# Patient Record
Sex: Male | Born: 1950 | Race: Black or African American | Hispanic: No | Marital: Married | Smoking: Former smoker
Health system: Southern US, Community
[De-identification: ages and names within clinical notes are randomized; demographics above are authoritative.]

## PROBLEM LIST (undated history)

## (undated) DIAGNOSIS — J45909 Unspecified asthma, uncomplicated: Secondary | ICD-10-CM

## (undated) DIAGNOSIS — K5090 Crohn's disease, unspecified, without complications: Secondary | ICD-10-CM

## (undated) DIAGNOSIS — J449 Chronic obstructive pulmonary disease, unspecified: Secondary | ICD-10-CM

## (undated) DIAGNOSIS — K529 Noninfective gastroenteritis and colitis, unspecified: Secondary | ICD-10-CM

## (undated) HISTORY — PX: SHOULDER SURGERY: SHX246

## (undated) HISTORY — PX: BACK SURGERY: SHX140

## (undated) HISTORY — PX: COLON SURGERY: SHX602

---

## 1997-10-25 ENCOUNTER — Encounter: Admission: RE | Admit: 1997-10-25 | Discharge: 1997-10-25 | Payer: Self-pay | Admitting: Family Medicine

## 1997-12-31 ENCOUNTER — Encounter: Admission: RE | Admit: 1997-12-31 | Discharge: 1997-12-31 | Payer: Self-pay | Admitting: Family Medicine

## 1998-05-06 ENCOUNTER — Encounter: Admission: RE | Admit: 1998-05-06 | Discharge: 1998-05-06 | Payer: Self-pay | Admitting: Sports Medicine

## 2000-11-24 ENCOUNTER — Ambulatory Visit (HOSPITAL_COMMUNITY): Admission: RE | Admit: 2000-11-24 | Discharge: 2000-11-24 | Payer: Self-pay | Admitting: Gastroenterology

## 2003-07-04 ENCOUNTER — Inpatient Hospital Stay (HOSPITAL_COMMUNITY): Admission: AD | Admit: 2003-07-04 | Discharge: 2003-07-19 | Payer: Self-pay | Admitting: Emergency Medicine

## 2003-07-25 ENCOUNTER — Ambulatory Visit (HOSPITAL_COMMUNITY): Admission: RE | Admit: 2003-07-25 | Discharge: 2003-07-25 | Payer: Self-pay | Admitting: General Surgery

## 2003-08-07 ENCOUNTER — Ambulatory Visit (HOSPITAL_COMMUNITY): Admission: RE | Admit: 2003-08-07 | Discharge: 2003-08-07 | Payer: Self-pay | Admitting: General Surgery

## 2003-09-10 ENCOUNTER — Encounter: Admission: RE | Admit: 2003-09-10 | Discharge: 2003-09-10 | Payer: Self-pay | Admitting: General Surgery

## 2003-09-28 ENCOUNTER — Encounter (INDEPENDENT_AMBULATORY_CARE_PROVIDER_SITE_OTHER): Payer: Self-pay | Admitting: Specialist

## 2003-09-28 ENCOUNTER — Inpatient Hospital Stay (HOSPITAL_COMMUNITY): Admission: RE | Admit: 2003-09-28 | Discharge: 2003-10-05 | Payer: Self-pay | Admitting: General Surgery

## 2003-10-16 ENCOUNTER — Encounter: Admission: RE | Admit: 2003-10-16 | Discharge: 2003-10-16 | Payer: Self-pay | Admitting: General Surgery

## 2004-05-13 ENCOUNTER — Ambulatory Visit: Payer: Self-pay | Admitting: Internal Medicine

## 2005-02-05 ENCOUNTER — Ambulatory Visit: Payer: Self-pay | Admitting: Internal Medicine

## 2005-02-12 ENCOUNTER — Ambulatory Visit: Payer: Self-pay | Admitting: Internal Medicine

## 2005-03-17 ENCOUNTER — Ambulatory Visit: Payer: Self-pay | Admitting: Internal Medicine

## 2005-05-07 ENCOUNTER — Ambulatory Visit: Payer: Self-pay | Admitting: Internal Medicine

## 2005-05-14 ENCOUNTER — Ambulatory Visit: Payer: Self-pay | Admitting: Internal Medicine

## 2005-06-09 ENCOUNTER — Ambulatory Visit: Payer: Self-pay | Admitting: Internal Medicine

## 2005-11-10 ENCOUNTER — Ambulatory Visit: Payer: Self-pay | Admitting: Internal Medicine

## 2005-12-08 IMAGING — CT CT PELVIS W/ CM
1 of 2 series · 15 of 32 positions shown, 19 images · IV contrast (omnipaque)
Comparison: none

CLINICAL DATA: Abdominal pain. White blood cell count [DATE].  Right lower quadrant pain for two days with fever.  
 CT SCAN OF THE ABDOMEN WITH CONTRAST
 After the intravenous injection of 100 cc of Omnipaque 300, a series of scans of the entire abdomen are made and show the lower lung fields, liver, gallbladder, hepatic radicals, common bile duct, pancreas and spleen to be normal.  The adrenal glands and kidneys appear normal.  No abnormal lymph nodes are seen within the abdomen. 
 Oral contrast has passed through the stomach and small bowel into the right colon. 
 IMPRESSION
 Normal CT scan of the abdomen with contrast.   
 CT SCAN OF THE PELVIS WITH CONTRAST
 Utilizing the same contrast as given for the abdomen, a series of scans of the pelvis were made and show thickening and inflammation associated with the sigmoid and lower left colon.  There is a minimal amount of free air anterior to the mid sigmoid best seen on images #69 and 118.   This free air appears to be localized and measures less than 1 cc in total size.  The oral contrast has passed into the right colon.  The appendix is well seen and appears normal and has some reflux of the contrast material.  Bones of the pelvis appear normal.  There are numerous calcifications within the prostate but the prostate is not enlarged.  Distal ureters, bladder, seminal vesicles appear normal.   No abnormal lymph nodes are seen within the pelvis. 
 Diverticulitis with minimal extraluminal gas which appears to be contained within the fat as noted above.  Numerous prostatic calcifications.

[Series 2: routine abdomen · axial · 0.74mm/px · z∈[-489,-94]mm · 15 of 117 slices shown, 19 images]
[im 9/117  soft-tissue]
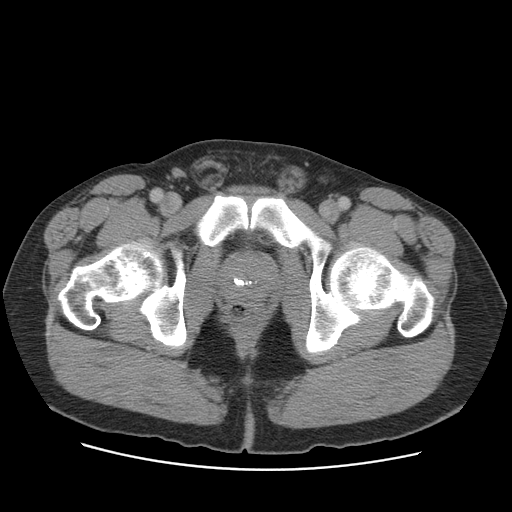
[im 9/117  bone]
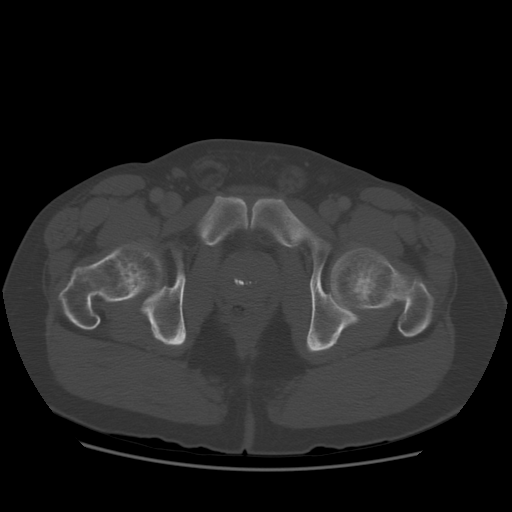
[im 17/117  soft-tissue]
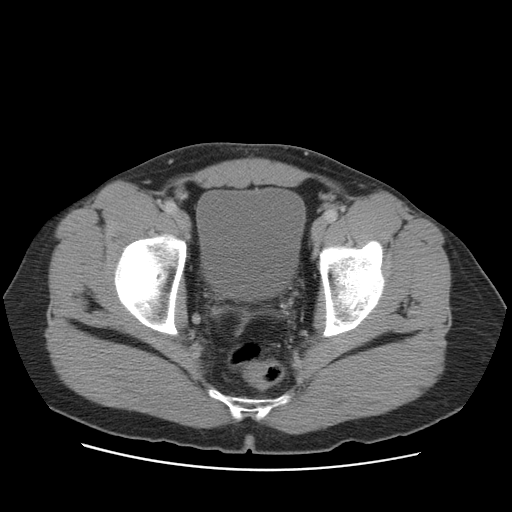
[im 25/117  soft-tissue]
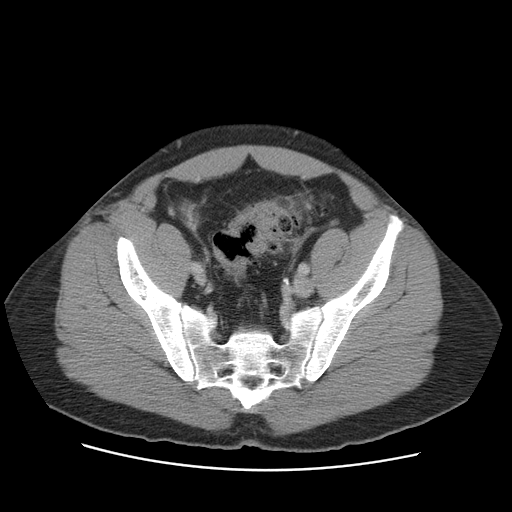
[im 33/117  soft-tissue]
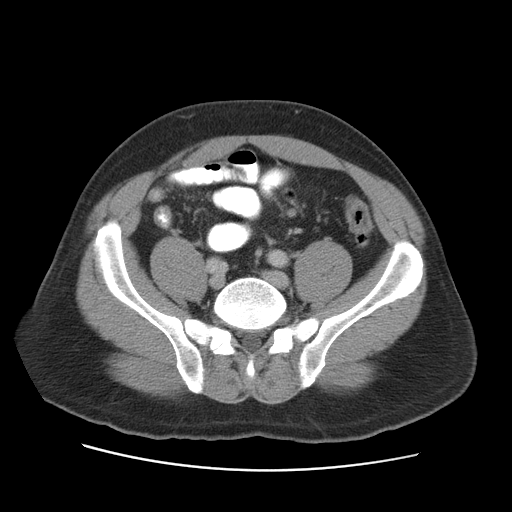
[im 41/117  soft-tissue]
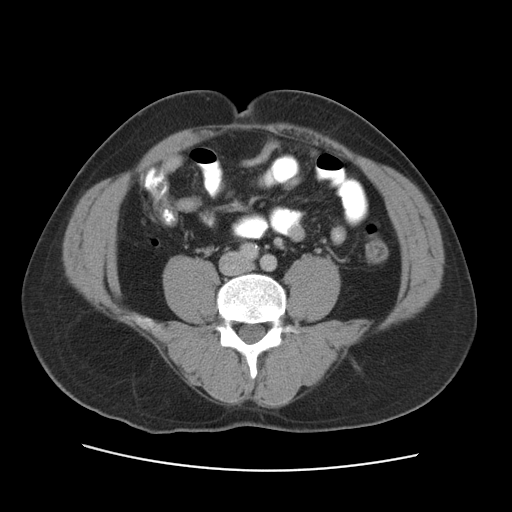
[im 49/117  soft-tissue]
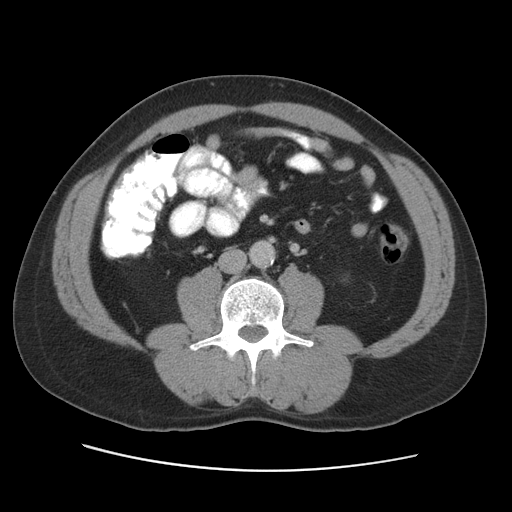
[im 61/117  soft-tissue]
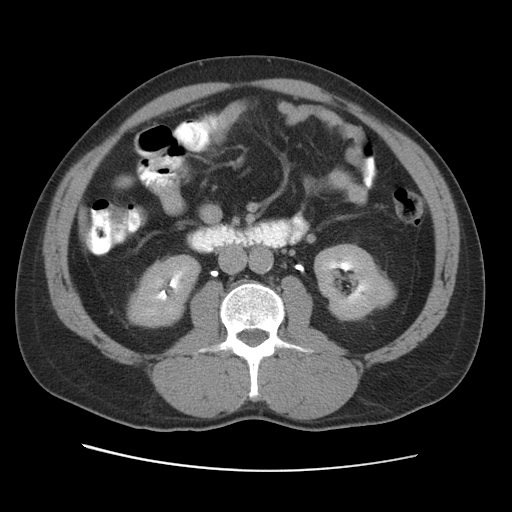
[im 69/117  soft-tissue]
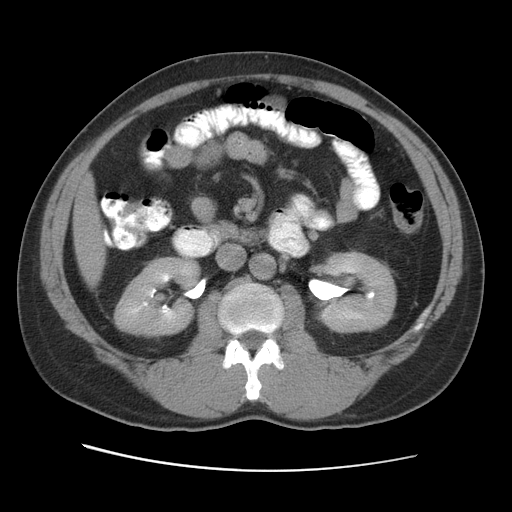
[im 77/117  soft-tissue]
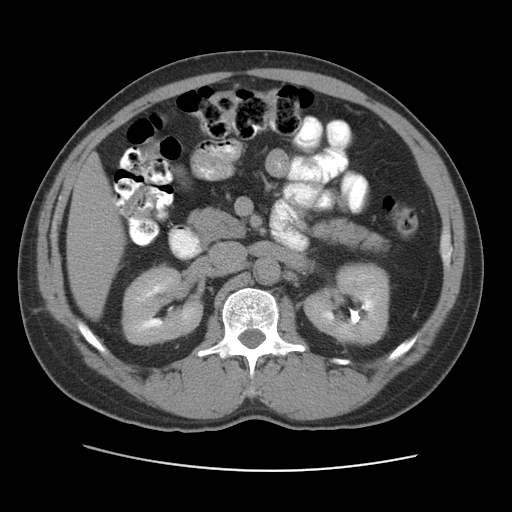
[im 77/117  bone]
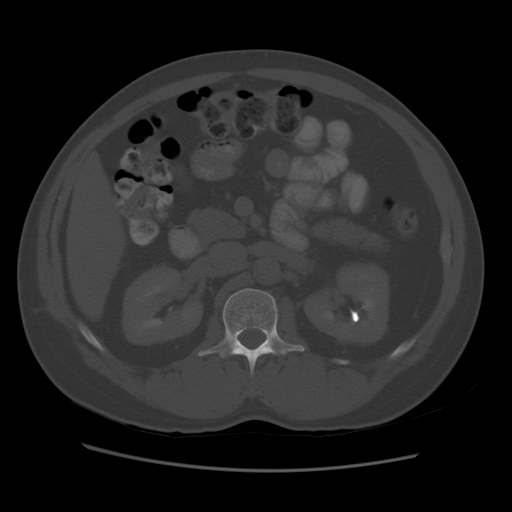
[im 85/117  soft-tissue]
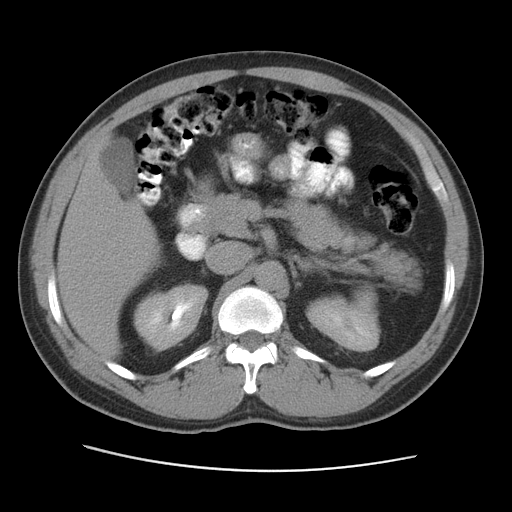
[im 93/117  soft-tissue]
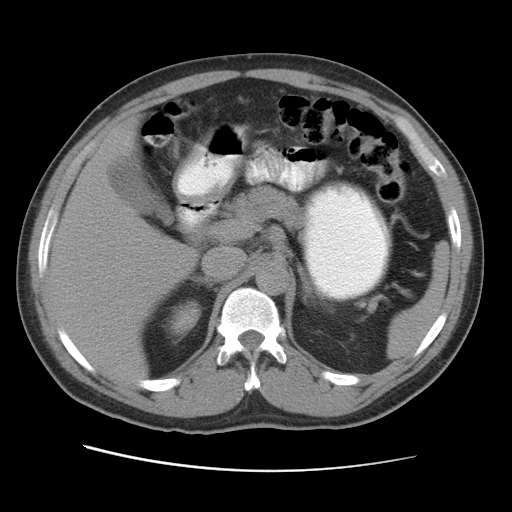
[im 101/117  soft-tissue]
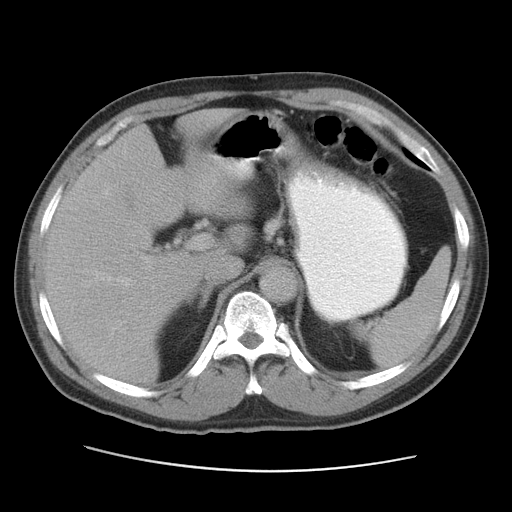
[im 101/117  lung]
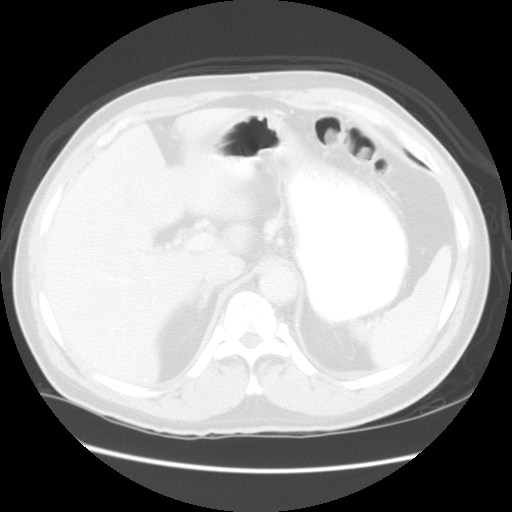
[im 105/117  lung]
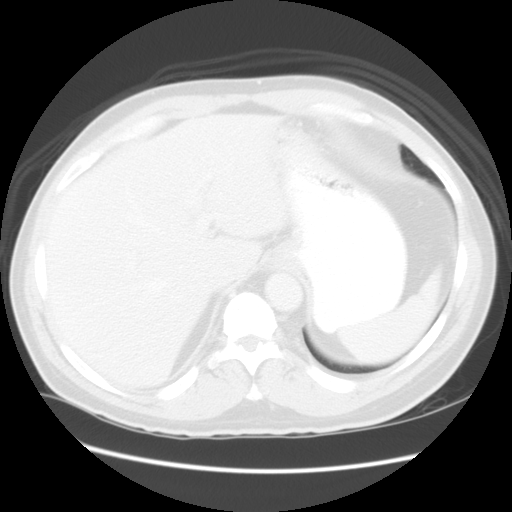
[im 109/117  soft-tissue]
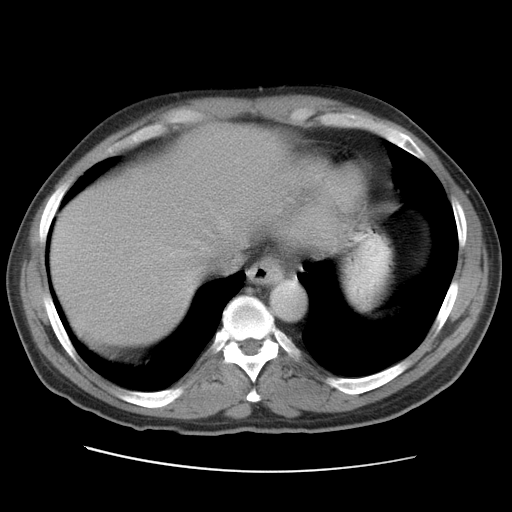
[im 109/117  lung]
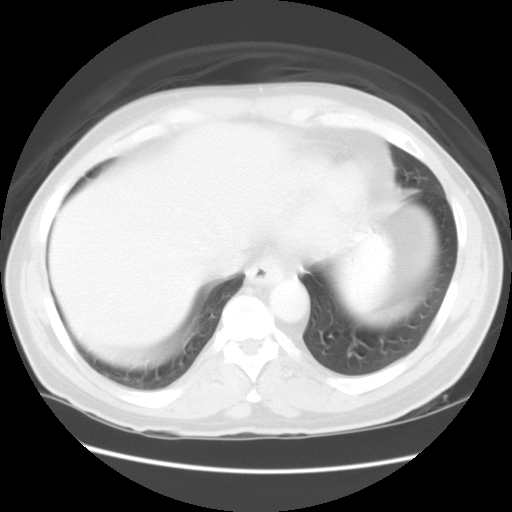
[im 113/117  lung]
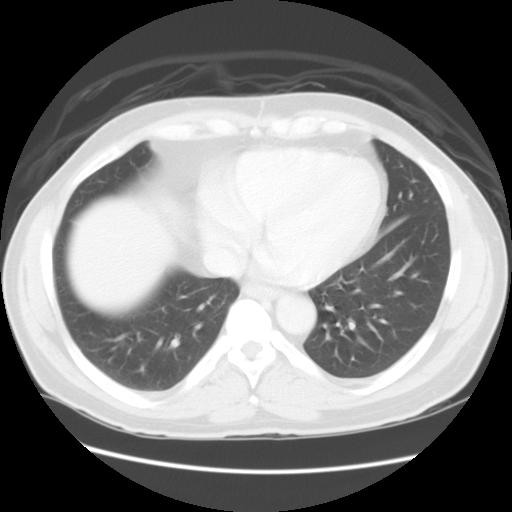

[15 of 32 positions shown; findings below may reference images not displayed]

## 2006-01-22 ENCOUNTER — Ambulatory Visit: Payer: Self-pay | Admitting: Internal Medicine

## 2006-06-10 ENCOUNTER — Ambulatory Visit: Payer: Self-pay | Admitting: Internal Medicine

## 2007-01-10 ENCOUNTER — Encounter: Payer: Self-pay | Admitting: Internal Medicine

## 2007-02-08 ENCOUNTER — Encounter: Payer: Self-pay | Admitting: Internal Medicine

## 2007-02-11 DIAGNOSIS — R519 Headache, unspecified: Secondary | ICD-10-CM | POA: Insufficient documentation

## 2007-02-11 DIAGNOSIS — R51 Headache: Secondary | ICD-10-CM

## 2007-03-16 ENCOUNTER — Encounter: Payer: Self-pay | Admitting: Internal Medicine

## 2007-03-25 ENCOUNTER — Encounter: Payer: Self-pay | Admitting: Internal Medicine

## 2007-04-06 ENCOUNTER — Encounter: Payer: Self-pay | Admitting: Internal Medicine

## 2007-04-12 ENCOUNTER — Encounter: Payer: Self-pay | Admitting: Internal Medicine

## 2007-04-19 ENCOUNTER — Encounter: Payer: Self-pay | Admitting: Internal Medicine

## 2007-04-22 ENCOUNTER — Ambulatory Visit: Payer: Self-pay | Admitting: Internal Medicine

## 2007-04-22 LAB — CONVERTED CEMR LAB
ALT: 11 units/L (ref 0–53)
AST: 18 units/L (ref 0–37)
Basophils Relative: 0 % (ref 0.0–1.0)
Bilirubin, Direct: 0.1 mg/dL (ref 0.0–0.3)
CO2: 31 meq/L (ref 19–32)
Calcium: 9.4 mg/dL (ref 8.4–10.5)
Chloride: 105 meq/L (ref 96–112)
Eosinophils Absolute: 0.5 10*3/uL (ref 0.0–0.6)
Eosinophils Relative: 5.2 % — ABNORMAL HIGH (ref 0.0–5.0)
GFR calc non Af Amer: 93 mL/min
Glucose, Bld: 93 mg/dL (ref 70–99)
HCT: 34.4 % — ABNORMAL LOW (ref 39.0–52.0)
Lymphocytes Relative: 30.7 % (ref 12.0–46.0)
MCV: 89.7 fL (ref 78.0–100.0)
Neutro Abs: 5 10*3/uL (ref 1.4–7.7)
Neutrophils Relative %: 52 % (ref 43.0–77.0)
Nitrite: NEGATIVE
PSA: 1.32 ng/mL (ref 0.10–4.00)
Platelets: 354 10*3/uL (ref 150–400)
RBC: 3.84 M/uL — ABNORMAL LOW (ref 4.22–5.81)
Sodium: 142 meq/L (ref 135–145)
Specific Gravity, Urine: >=1.03
Total Protein: 7 g/dL (ref 6.0–8.3)
Urobilinogen, UA: 0.2
VLDL: 23 mg/dL (ref 0–40)
WBC Urine, dipstick: NEGATIVE
WBC: 9.7 10*3/uL (ref 4.5–10.5)

## 2007-05-02 ENCOUNTER — Ambulatory Visit: Payer: Self-pay | Admitting: Internal Medicine

## 2007-05-02 DIAGNOSIS — J45901 Unspecified asthma with (acute) exacerbation: Secondary | ICD-10-CM

## 2007-05-02 DIAGNOSIS — K5090 Crohn's disease, unspecified, without complications: Secondary | ICD-10-CM | POA: Insufficient documentation

## 2007-05-02 DIAGNOSIS — Z86010 Personal history of colonic polyps: Secondary | ICD-10-CM | POA: Insufficient documentation

## 2007-05-13 ENCOUNTER — Encounter (HOSPITAL_COMMUNITY): Admission: RE | Admit: 2007-05-13 | Discharge: 2007-06-01 | Payer: Self-pay | Admitting: Gastroenterology

## 2007-05-13 ENCOUNTER — Encounter: Payer: Self-pay | Admitting: Internal Medicine

## 2007-05-25 ENCOUNTER — Encounter: Payer: Self-pay | Admitting: Internal Medicine

## 2007-06-22 ENCOUNTER — Encounter: Payer: Self-pay | Admitting: Internal Medicine

## 2007-06-23 ENCOUNTER — Encounter (HOSPITAL_COMMUNITY): Admission: RE | Admit: 2007-06-23 | Discharge: 2007-09-21 | Payer: Self-pay | Admitting: Gastroenterology

## 2007-07-19 ENCOUNTER — Encounter: Payer: Self-pay | Admitting: Internal Medicine

## 2007-07-28 ENCOUNTER — Ambulatory Visit: Payer: Self-pay | Admitting: Internal Medicine

## 2007-07-28 DIAGNOSIS — K645 Perianal venous thrombosis: Secondary | ICD-10-CM

## 2007-08-25 ENCOUNTER — Ambulatory Visit: Payer: Self-pay | Admitting: Internal Medicine

## 2007-08-25 DIAGNOSIS — J0100 Acute maxillary sinusitis, unspecified: Secondary | ICD-10-CM

## 2007-09-23 ENCOUNTER — Encounter: Payer: Self-pay | Admitting: Internal Medicine

## 2007-09-27 ENCOUNTER — Encounter (HOSPITAL_COMMUNITY): Admission: RE | Admit: 2007-09-27 | Discharge: 2007-12-26 | Payer: Self-pay | Admitting: Gastroenterology

## 2007-10-17 ENCOUNTER — Emergency Department (HOSPITAL_COMMUNITY): Admission: EM | Admit: 2007-10-17 | Discharge: 2007-10-17 | Payer: Self-pay | Admitting: Emergency Medicine

## 2007-10-21 ENCOUNTER — Encounter: Payer: Self-pay | Admitting: Internal Medicine

## 2007-12-14 ENCOUNTER — Encounter: Payer: Self-pay | Admitting: Internal Medicine

## 2008-01-17 ENCOUNTER — Encounter (HOSPITAL_COMMUNITY): Admission: RE | Admit: 2008-01-17 | Discharge: 2008-01-18 | Payer: Self-pay | Admitting: Gastroenterology

## 2008-01-23 ENCOUNTER — Encounter: Payer: Self-pay | Admitting: Internal Medicine

## 2008-02-22 ENCOUNTER — Encounter (HOSPITAL_COMMUNITY): Admission: RE | Admit: 2008-02-22 | Discharge: 2008-05-22 | Payer: Self-pay | Admitting: Gastroenterology

## 2008-05-03 ENCOUNTER — Encounter: Payer: Self-pay | Admitting: Internal Medicine

## 2008-05-28 ENCOUNTER — Encounter: Payer: Self-pay | Admitting: Internal Medicine

## 2008-06-13 ENCOUNTER — Encounter (HOSPITAL_COMMUNITY): Admission: RE | Admit: 2008-06-13 | Discharge: 2008-09-11 | Payer: Self-pay | Admitting: Gastroenterology

## 2008-09-12 ENCOUNTER — Encounter (HOSPITAL_COMMUNITY): Admission: RE | Admit: 2008-09-12 | Discharge: 2008-12-11 | Payer: Self-pay | Admitting: Gastroenterology

## 2008-11-07 ENCOUNTER — Encounter: Payer: Self-pay | Admitting: Internal Medicine

## 2009-01-08 ENCOUNTER — Encounter (HOSPITAL_COMMUNITY): Admission: RE | Admit: 2009-01-08 | Discharge: 2009-04-08 | Payer: Self-pay | Admitting: Gastroenterology

## 2009-04-08 ENCOUNTER — Observation Stay (HOSPITAL_COMMUNITY): Admission: EM | Admit: 2009-04-08 | Discharge: 2009-04-08 | Payer: Self-pay | Admitting: Emergency Medicine

## 2009-04-09 ENCOUNTER — Ambulatory Visit: Payer: Self-pay | Admitting: Internal Medicine

## 2009-04-09 DIAGNOSIS — E7800 Pure hypercholesterolemia, unspecified: Secondary | ICD-10-CM | POA: Insufficient documentation

## 2009-04-09 DIAGNOSIS — Z87891 Personal history of nicotine dependence: Secondary | ICD-10-CM | POA: Insufficient documentation

## 2009-04-10 ENCOUNTER — Encounter (HOSPITAL_COMMUNITY): Admission: RE | Admit: 2009-04-10 | Discharge: 2009-05-31 | Payer: Self-pay | Admitting: Gastroenterology

## 2009-04-18 ENCOUNTER — Telehealth: Payer: Self-pay | Admitting: Internal Medicine

## 2009-05-14 ENCOUNTER — Telehealth: Payer: Self-pay | Admitting: Internal Medicine

## 2009-11-04 ENCOUNTER — Ambulatory Visit: Payer: Self-pay | Admitting: Internal Medicine

## 2009-11-04 DIAGNOSIS — L255 Unspecified contact dermatitis due to plants, except food: Secondary | ICD-10-CM

## 2010-03-20 ENCOUNTER — Encounter (HOSPITAL_COMMUNITY)
Admission: RE | Admit: 2010-03-20 | Discharge: 2010-06-18 | Payer: Self-pay | Source: Home / Self Care | Attending: Internal Medicine | Admitting: Internal Medicine

## 2010-05-30 ENCOUNTER — Emergency Department (HOSPITAL_COMMUNITY)
Admission: EM | Admit: 2010-05-30 | Discharge: 2010-05-30 | Payer: Self-pay | Source: Home / Self Care | Admitting: Emergency Medicine

## 2010-06-19 ENCOUNTER — Encounter (HOSPITAL_COMMUNITY)
Admission: RE | Admit: 2010-06-19 | Discharge: 2010-07-01 | Payer: Self-pay | Source: Home / Self Care | Attending: Internal Medicine | Admitting: Internal Medicine

## 2010-06-21 ENCOUNTER — Encounter: Payer: Self-pay | Admitting: General Surgery

## 2010-06-22 ENCOUNTER — Encounter: Payer: Self-pay | Admitting: General Surgery

## 2010-07-01 NOTE — Assessment & Plan Note (Signed)
Summary: rash/njr   Vital Signs:  Patient profile:   60 year old male Weight:      216 pounds Temp:     98.1 degrees F oral BP sitting:   112 / 74  (right arm) Cuff size:   regular  Vitals Entered By: Duard Brady LPN (November 04, 2593 10:55 AM) CC: c/o rash to (L) arm and trunk x1wk - itching Is Patient Diabetic? No   CC:  c/o rash to (L) arm and trunk x1wk - itching.  History of Present Illness: 60 year old patient who is seen today for follow-up of his blood pressure.  He presented to J. complaint involved in a rash involving his arms and left trunk region.  This is quite pruritic.  He does have a history of Crohn's disease and is followed closely by GI.  He has treated dyslipidemia.  He has a history of, asthma that's been stable  Preventive Screening-Counseling & Management  Alcohol-Tobacco     Smoking Status: never  Allergies (verified): No Known Drug Allergies  Past History:  Past Medical History: Reviewed history from 05/02/2007 and no changes required. High cholesterol Headache IBD 1997 Asthma Colonic polyps, hx of  Social History: Smoking Status:  never  Review of Systems       The patient complains of dyspnea on exertion and suspicious skin lesions.  The patient denies anorexia, fever, weight loss, weight gain, vision loss, decreased hearing, hoarseness, chest pain, syncope, peripheral edema, prolonged cough, headaches, hemoptysis, abdominal pain, melena, hematochezia, severe indigestion/heartburn, hematuria, incontinence, genital sores, transient blindness, difficulty walking, depression, unusual weight change, abnormal bleeding, enlarged lymph nodes, angioedema, breast masses, and testicular masses.    Physical Exam  General:  overweight-appearing.  120/80overweight-appearing.   Head:  Normocephalic and atraumatic without obvious abnormalities. No apparent alopecia or balding. Eyes:  No corneal or conjunctival inflammation noted. EOMI. Perrla.  Funduscopic exam benign, without hemorrhages, exudates or papilledema. Vision grossly normal. Mouth:  Oral mucosa and oropharynx without lesions or exudates.  Teeth in good repair. Neck:  No deformities, masses, or tenderness noted. Lungs:  Normal respiratory effort, chest expands symmetrically. Lungs are clear to auscultation, no crackles or wheezes. Heart:  Normal rate and regular rhythm. S1 and S2 normal without gallop, murmur, click, rub or other extra sounds. Abdomen:  Bowel sounds positive,abdomen soft and non-tender without masses, organomegaly or hernias noted. Msk:  No deformity or scoliosis noted of thoracic or lumbar spine.     Impression & Recommendations:  Problem # 1:  HYPERCHOLESTEROLEMIA (ICD-272.0)  His updated medication list for this problem includes:    Simvastatin 80 Mg Tabs (Simvastatin) .Marland Kitchen... 1 once daily  His updated medication list for this problem includes:    Simvastatin 80 Mg Tabs (Simvastatin) .Marland Kitchen... 1 once daily  Problem # 2:  CROHN'S DISEASE (ICD-555.9)  Problem # 3:  ASTHMA (ICD-493.90)  His updated medication list for this problem includes:    Asmanex 30 Metered Doses 220 Mcg/inh Aepb (Mometasone furoate) .Marland Kitchen... 2 puffs at bedtime    Perforomist 20 Mcg/67ml Nebu (Formoterol fumarate) ..... Use qid    Albuterol 90 Mcg/act Aers (Albuterol) .Marland Kitchen... As dir  His updated medication list for this problem includes:    Asmanex 30 Metered Doses 220 Mcg/inh Aepb (Mometasone furoate) .Marland Kitchen... 2 puffs at bedtime    Perforomist 20 Mcg/48ml Nebu (Formoterol fumarate) ..... Use qid    Albuterol 90 Mcg/act Aers (Albuterol) .Marland Kitchen... As dir  Problem # 4:  RHUS DERMATITIS (ICD-692.6)  His updated medication  list for this problem includes:    Cvs Loratadine 10 Mg Tabs (Loratadine) .Marland Kitchen... 1 once daily    Triamcinolone Acetonide 0.1 % Crea (Triamcinolone acetonide) .Marland Kitchen... Apply twice daily  His updated medication list for this problem includes:    [facility]vs Loratadine 10 Mg Tabs  (Loratadine) .Marland Kitchen... 1 once daily    Triamcinolone Acetonide 0.1 % Crea (Triamcinolone acetonide) .Marland Kitchen... Apply twice daily  Complete Medication List: 1)  Simvastatin 80 Mg Tabs (Simvastatin) .Marland Kitchen.. 1 once daily 2)  Asmanex 30 Metered Doses 220 Mcg/inh Aepb (Mometasone furoate) .... 2 puffs at bedtime 3)  [facility]vs Loratadine 10 Mg Tabs (Loratadine) .Marland Kitchen.. 1 once daily 4)  Flunisolide 0.025 % Soln (Flunisolide) .... As dir 5)  Perforomist 20 Mcg/58ml Nebu (Formoterol fumarate) .... Use qid 6)  Albuterol 90 Mcg/act Aers (Albuterol) .... As dir 7)  Levitra 20 Mg Tabs (Vardenafil hcl) 8)  Remicade 100 Mg Solr (Infliximab) 9)  Vitamin D 1000 Unit Tabs (Cholecalciferol) .... Qd 10)  Triamcinolone Acetonide 0.1 % Crea (Triamcinolone acetonide) .... Apply twice daily  Patient Instructions: 1)  Please schedule a follow-up appointment in 6 months. 2)  Limit your Sodium (Salt) to less than 2 grams a day(slightly less than 1/2 a teaspoon) to prevent fluid retention, swelling, or worsening of symptoms. 3)  It is important that you exercise regularly at least 20 minutes 5 times a week. If you develop chest pain, have severe difficulty breathing, or feel very tired , stop exercising immediately and seek medical attention. 4)  You need to lose weight. Consider a lower calorie diet and regular exercise.  Prescriptions: TRIAMCINOLONE ACETONIDE 0.1 % CREA (TRIAMCINOLONE ACETONIDE) apply twice daily  #60 gm x 0   Entered and Authorized by:   Gordy Savers  MD   Signed by:   Gordy Savers  MD on 11/04/2009   Method used:   Print then Give to Patient   RxID:   [id number] TRIAMCINOLONE ACETONIDE 0.1 % CREA (TRIAMCINOLONE ACETONIDE) apply twice daily  #60 gm x 0   Entered and Authorized by:   Gordy Savers  MD   Signed by:   Gordy Savers  MD on 11/04/2009   Method used:   Electronically to        [facility] [address] [phone]* (retail)       [address]       [place], [place]  [address]       Ph: [phone] or [phone]       Fax: [phone]   RxID:   [id number] LEVITRA 20 MG  TABS (VARDENAFIL HCL)   #12 x 6   Entered and Authorized by:   Gordy Savers  MD   Signed by:   Gordy Savers  MD on 11/04/2009   Method used:   Print then Give to Patient   RxID:   [id number] ALBUTEROL 90 MCG/ACT  AERS (ALBUTEROL) as dir  #3 x 6   Entered and Authorized by:   Gordy Savers  MD   Signed by:   Gordy Savers  MD on 11/04/2009   Method used:   [facility]        [facility] [address] [phone]* (retail)       [address]       [place], [place]  [address]       Ph: [phone] or  [phone]       Fax: [phone]   RxID:   [id number] PERFOROMIST 20 MCG/2ML  NEBU (FORMOTEROL FUMARATE) use qid  #120 x 6   Entered and Authorized by:   Gordy Savers  MD   Signed by:   Gordy Savers  MD on 11/04/2009   Method used:   [facility]ronically to        [facility] [address] [phone]* (retail)       [address]       [place], [place]  [address]       Ph: [phone] or [phone]       Fax: [phone]   RxID:   [id number] FLUNISOLIDE 0.025 %  SOLN (FLUNISOLIDE) as dir  #3 x 6   Entered and Authorized by:   Gordy Savers  MD   Signed by:   Gordy Savers  MD on 11/04/2009   Method used:   [facility]ronically to        [facility] [address] [phone]* (retail)       [address]       [place], [place]  [address]       Ph: [phone] or [phone]       Fax: [phone]   RxID:   [id number] ASMANEX 30 METERED DOSES 220 MCG/INH  AEPB (MOMETASONE FUROATE) 2 puffs at bedtime  #3 x 6   Entered and Authorized by:   Gordy Savers  MD   Signed by:   Gordy Savers  MD on 11/04/2009   Method used:   [facility]ronically to        [facility] [address] [phone]* (retail)       [address]       [place], [place]  [address]       Ph: [phone] or [phone]       Fax: [phone]   RxID:   [id number] SIMVASTATIN 80 MG  TABS (SIMVASTATIN) 1 once daily  #90 x 6   Entered and Authorized by:   Gordy Savers  MD   Signed by:   Gordy Savers  MD on 11/04/2009   Method used:   [facility]ron[facility] to        [facility] [address] [phone]* (retail)       [address]       [place], [place]  [address]       Ph: [phone] or [phone]       Fax: [phone]   RxID:   [id number]

## 2010-07-03 ENCOUNTER — Ambulatory Visit (HOSPITAL_COMMUNITY): Payer: Self-pay

## 2010-07-08 ENCOUNTER — Ambulatory Visit (HOSPITAL_COMMUNITY): Payer: Self-pay

## 2010-07-10 ENCOUNTER — Ambulatory Visit (HOSPITAL_COMMUNITY): Payer: Self-pay

## 2010-07-15 ENCOUNTER — Ambulatory Visit (HOSPITAL_COMMUNITY): Payer: Self-pay

## 2010-07-17 ENCOUNTER — Ambulatory Visit (HOSPITAL_COMMUNITY): Payer: Self-pay

## 2010-07-22 ENCOUNTER — Ambulatory Visit (HOSPITAL_COMMUNITY): Payer: Self-pay

## 2010-07-24 ENCOUNTER — Ambulatory Visit (HOSPITAL_COMMUNITY): Payer: Self-pay

## 2010-07-29 ENCOUNTER — Ambulatory Visit (HOSPITAL_COMMUNITY): Payer: Self-pay

## 2010-07-31 ENCOUNTER — Ambulatory Visit (HOSPITAL_COMMUNITY): Payer: Self-pay

## 2010-08-05 ENCOUNTER — Ambulatory Visit (HOSPITAL_COMMUNITY): Payer: Self-pay

## 2010-08-07 ENCOUNTER — Ambulatory Visit (HOSPITAL_COMMUNITY): Payer: Self-pay

## 2010-08-12 ENCOUNTER — Ambulatory Visit (HOSPITAL_COMMUNITY): Payer: Self-pay

## 2010-08-14 ENCOUNTER — Ambulatory Visit (HOSPITAL_COMMUNITY): Payer: Self-pay

## 2010-08-19 ENCOUNTER — Ambulatory Visit (HOSPITAL_COMMUNITY): Payer: Self-pay

## 2010-08-21 ENCOUNTER — Ambulatory Visit (HOSPITAL_COMMUNITY): Payer: Self-pay

## 2010-08-26 ENCOUNTER — Ambulatory Visit (HOSPITAL_COMMUNITY): Payer: Self-pay

## 2010-08-28 ENCOUNTER — Ambulatory Visit (HOSPITAL_COMMUNITY): Payer: Self-pay

## 2010-09-02 ENCOUNTER — Ambulatory Visit (HOSPITAL_COMMUNITY): Payer: Self-pay

## 2010-09-03 LAB — POCT I-STAT, CHEM 8
BUN: 9 mg/dL (ref 6–23)
Calcium, Ion: 1.19 mmol/L (ref 1.12–1.32)
Creatinine, Ser: 1 mg/dL (ref 0.4–1.5)
Glucose, Bld: 110 mg/dL — ABNORMAL HIGH (ref 70–99)
Hemoglobin: 13.9 g/dL (ref 13.0–17.0)
TCO2: 30 mmol/L (ref 0–100)

## 2010-09-03 LAB — HEPATIC FUNCTION PANEL
ALT: 12 U/L (ref 0–53)
AST: 19 U/L (ref 0–37)
Albumin: 3.4 g/dL — ABNORMAL LOW (ref 3.5–5.2)
Alkaline Phosphatase: 64 U/L (ref 39–117)
Total Bilirubin: 0.6 mg/dL (ref 0.3–1.2)
Total Protein: 7.2 g/dL (ref 6.0–8.3)

## 2010-09-03 LAB — BASIC METABOLIC PANEL
Chloride: 107 mEq/L (ref 96–112)
GFR calc non Af Amer: >60 mL/min (ref >60)
Glucose, Bld: 111 mg/dL — ABNORMAL HIGH (ref 70–99)
Potassium: 4.1 mEq/L (ref 3.5–5.1)
Sodium: 142 mEq/L (ref 135–145)

## 2010-09-03 LAB — LIPID PANEL
Cholesterol: 270 mg/dL — ABNORMAL HIGH (ref 0–200)
Total CHOL/HDL Ratio: 6.8 RATIO

## 2010-09-03 LAB — TROPONIN I
Troponin I: 0.01 ng/mL (ref 0.00–0.06)
Troponin I: 0.01 ng/mL (ref 0.00–0.06)

## 2010-09-03 LAB — HOMOCYSTEINE: Homocysteine: 5.5 umol/L (ref 4.0–15.4)

## 2010-09-03 LAB — CBC
HCT: 37.6 % — ABNORMAL LOW (ref 39.0–52.0)
Hemoglobin: 12.8 g/dL — ABNORMAL LOW (ref 13.0–17.0)
MCV: 92.8 fL (ref 78.0–100.0)
RDW: 12.7 % (ref 11.5–15.5)
WBC: 9.4 10*3/uL (ref 4.0–10.5)

## 2010-09-03 LAB — CK TOTAL AND CKMB (NOT AT ARMC)
Relative Index: 1 (ref 0.0–2.5)
Relative Index: 1.1 (ref 0.0–2.5)

## 2010-09-03 LAB — LIPASE, BLOOD: Lipase: 34 U/L (ref 11–59)

## 2010-09-03 LAB — TSH: TSH: 1.325 u[IU]/mL (ref 0.350–4.500)

## 2010-09-04 ENCOUNTER — Ambulatory Visit (HOSPITAL_COMMUNITY): Payer: Self-pay

## 2010-09-09 ENCOUNTER — Ambulatory Visit (HOSPITAL_COMMUNITY): Payer: Self-pay

## 2010-09-11 ENCOUNTER — Ambulatory Visit (HOSPITAL_COMMUNITY): Payer: Self-pay

## 2010-09-16 ENCOUNTER — Ambulatory Visit (HOSPITAL_COMMUNITY): Payer: Self-pay

## 2010-09-18 ENCOUNTER — Ambulatory Visit (HOSPITAL_COMMUNITY): Payer: Self-pay

## 2010-09-23 ENCOUNTER — Ambulatory Visit (HOSPITAL_COMMUNITY): Payer: Self-pay

## 2010-09-25 ENCOUNTER — Ambulatory Visit (HOSPITAL_COMMUNITY): Payer: Self-pay

## 2010-09-30 ENCOUNTER — Ambulatory Visit (HOSPITAL_COMMUNITY): Payer: Self-pay

## 2010-10-02 ENCOUNTER — Ambulatory Visit (HOSPITAL_COMMUNITY): Payer: Self-pay

## 2010-10-07 ENCOUNTER — Ambulatory Visit (HOSPITAL_COMMUNITY): Payer: Self-pay

## 2010-10-09 ENCOUNTER — Ambulatory Visit (HOSPITAL_COMMUNITY): Payer: Self-pay

## 2010-10-14 ENCOUNTER — Ambulatory Visit (HOSPITAL_COMMUNITY): Payer: Self-pay

## 2010-10-17 NOTE — Op Note (Signed)
NAME:  Kevin Yang, Kevin Yang                          ACCOUNT NO.:  [id number]   MEDICAL RECORD NO.:  [id number]                   PATIENT TYPE:  INP   LOCATION:  5713                                 FACILITY:  [facility]   PHYSICIAN:  Ollen Gross. Vernell Morgans, M.D.              DATE OF BIRTH:  1950-10-25   DATE OF PROCEDURE:  09/28/2003  DATE OF DISCHARGE:                                 OPERATIVE REPORT   PREOPERATIVE DIAGNOSIS:  Prior sigmoid diverticulitis.   POSTOPERATIVE DIAGNOSIS:  Prior sigmoid diverticulitis.   PROCEDURE:  Sigmoid colectomy.   SURGEON:  Ollen Gross. Carolynne Edouard, M.D.   ASSISTANT:  Rose Phi. Maple Hudson, M.D.   ANESTHESIA:  General endotracheal.   PROCEDURE:  After informed consent was obtained, the patient was brought to  the operating room and placed in the supine position on the operating table.  After adequate induction of general anesthesia, the patient was placed in  lithotomy position.  His abdomen was prepped with Betadine and draped in the  usual sterile manner.  A lower midline incision was made with a 10 blade  knife.  This incision was carried down through the skin and subcutaneous  tissue sharply with the electrocautery until the linea alba was identified.  The linea alba was also incised with the electrocautery.  The preperitoneal  space was then probed bluntly with a hemostat until the peritoneum was  opened and access was gained to the abdominal cavity.  The rest of the  incision was then opened under direct vision with the electrocautery.  A  Balfour retractor was placed to retract the abdominal sidewall laterally,  and a bladder blade was used to retract inferiorly.  There was some dense  inflammation and adhesion of the sigmoid colon to the anterior abdominal  wall-bladder area.  This was taken down with blunt finger dissection.  The  sigmoid colon was then mobilized by incising its retroperitoneal attachment  along the white line of Toldt laterally.  This allowed the  sigmoid colon to  be very mobile.  There was a short segment of sigmoid colon that appeared to  be diseased.  The rest of the left colon appeared to be normal.  A site was  chosen initially above the area of disease where the left colon appeared to  be normal.  The mesentery at this point was opened sharply with the  electrocautery.  The left colon was also mobilized by incising its  retroperitoneal attachment along the white line of Toldt so it could be  mobilized inferiorly.  The colon at this point was then divided between a  [employer] clamp.  The mesentery of the sigmoid colon was then taken  down by serially clamping with Kelly clamps, dividing and ligating with 2-0  silk ties each of the vessels in the mesentery.  The ureter was identified  and care was taken to keep the dissection away  from where the ureter runs.  Once the dissection had been carried below the area of diseased sigmoid  colon, the mesentery at the rectosigmoid where the bowel appeared to be  healthy was opened sharply with the electrocautery.  A right angle bowel  clamp was placed across the bowel at this point and the bowel was divided  sharply beneath this clamp with a 10 blade knife and the rest of the  mesentery was taken down to this segment with a Kelly clamp and ligating  with 2-0 silk ties.  The sigmoid colon specimen was then removed from the  patient and sent to pathology for further evaluation.  The proximal and  distal segments reached each other easily without any tension.  The  anastomosis was created using full-thickness interrupted 3-0 silk stitches.  The back wall was done first.  All the stitches were placed and then each  one was tied down.  Once the back was in good approximation, the anterior  wall was then anastomosed in the same way using full-thickness interrupted 3-  0 silk stitches, burying the knots inside.  The last several stitches were  placed simply with the knots on the outside.   Once this was accomplished,  the bowel anastomosis appeared to be very healthy and intact.  The wound was  all examined and found to b hemostatic.  The abdomen was then irrigated with  copious amounts of saline.  The rest of the abdomen was inspected and there  were no other abnormalities noted.  The anterior abdominal wall fascia was  closed with two running #1 PDS sutures.  The subcutaneous tissue was  irrigated with Betadine and saline solution and the skin was closed with  staples.  Sterile dressings were applied.  At the end of the case all  needle, sponge, and instrument counts were correct.  The patient was then  awakened and taken to the recovery room in stable condition.                                               Ollen Gross. Vernell Morgans, M.D.    PST/MEDQ  D:  10/04/2003  T:  10/04/2003  Job:  [id number]

## 2010-10-17 NOTE — Discharge Summary (Signed)
NAME:  Kevin Yang, Kevin Yang                          ACCOUNT NO.:  [id number]   MEDICAL RECORD NO.:  [id number]                   PATIENT TYPE:  INP   LOCATION:  5713                                 FACILITY:  [facility]   PHYSICIAN:  Ollen Gross. Vernell Morgans, M.D.              DATE OF BIRTH:  Sep 09, 1950   DATE OF ADMISSION:  09/28/2003  DATE OF DISCHARGE:  10/05/2003                                 DISCHARGE SUMMARY   HOSPITAL COURSE:  Mr. Kepner is a 60 year old gentleman who was brought to  the operating room on September 20, 2003 for a sigmoid colectomy from a  complicated sigmoid diverticulitis.  He tolerated the operation well.  His  postoperative course was complicated by a short ileus requiring bowel rest  and NG tube, the NG tube was able to be discontinued on Oct 02, 2003 and his  diet was slowly advanced, he did also develop a small superficial wound  infection the upper portion of his incision which was open prior to  discharge and he was instructed on dressing changes, home health nursing was  arranged for dressing changes at home but he tolerated all this and on Oct 05, 2003 was ready for discharge.   MEDICATIONS AT THE TIME OF DISCHARGE:  He was to resume his home meds and he  was given a prescription for Vicodin for pain.   ACTIVITY:  No heavy lifting.   DIET:  As tolerated.   FINAL DIAGNOSIS:  Sigmoid diverticulitis.   FOLLOW UP:  Dr. Carolynne Edouard in 2 weeks.   CONDITION:  Stable.   DISPOSITION:  He is discharged home.                                                Ollen Gross. Vernell Morgans, M.D.    PST/MEDQ  D:  11/21/2003  T:  11/23/2003  Job:  [id number]

## 2010-12-21 ENCOUNTER — Emergency Department (HOSPITAL_COMMUNITY)
Admission: EM | Admit: 2010-12-21 | Discharge: 2010-12-21 | Disposition: A | Discharge status: Home / Self Care | Payer: Medicare Other | Attending: Emergency Medicine | Admitting: Emergency Medicine

## 2010-12-21 ENCOUNTER — Emergency Department (HOSPITAL_COMMUNITY): Payer: Medicare Other

## 2010-12-21 DIAGNOSIS — K5090 Crohn's disease, unspecified, without complications: Secondary | ICD-10-CM | POA: Insufficient documentation

## 2010-12-21 DIAGNOSIS — I10 Essential (primary) hypertension: Secondary | ICD-10-CM | POA: Insufficient documentation

## 2010-12-21 DIAGNOSIS — R42 Dizziness and giddiness: Secondary | ICD-10-CM | POA: Insufficient documentation

## 2010-12-21 DIAGNOSIS — E7800 Pure hypercholesterolemia, unspecified: Secondary | ICD-10-CM | POA: Insufficient documentation

## 2010-12-21 DIAGNOSIS — R55 Syncope and collapse: Secondary | ICD-10-CM | POA: Insufficient documentation

## 2010-12-21 DIAGNOSIS — Z79899 Other long term (current) drug therapy: Secondary | ICD-10-CM | POA: Insufficient documentation

## 2010-12-21 DIAGNOSIS — J4489 Other specified chronic obstructive pulmonary disease: Secondary | ICD-10-CM | POA: Insufficient documentation

## 2010-12-21 DIAGNOSIS — R002 Palpitations: Secondary | ICD-10-CM | POA: Insufficient documentation

## 2010-12-21 DIAGNOSIS — J449 Chronic obstructive pulmonary disease, unspecified: Secondary | ICD-10-CM | POA: Insufficient documentation

## 2010-12-21 LAB — TROPONIN I: Troponin I: <0.3 ng/mL (ref <0.30)

## 2010-12-21 LAB — CBC
HCT: 41.8 % (ref 39.0–52.0)
Hemoglobin: 14 g/dL (ref 13.0–17.0)
WBC: 9.5 10*3/uL (ref 4.0–10.5)

## 2010-12-21 LAB — DIFFERENTIAL
Basophils Absolute: 0 10*3/uL (ref 0.0–0.1)
Lymphocytes Relative: 32 % (ref 12–46)
Lymphs Abs: 3.1 10*3/uL (ref 0.7–4.0)
Neutro Abs: 5.4 10*3/uL (ref 1.7–7.7)

## 2010-12-21 LAB — COMPREHENSIVE METABOLIC PANEL
ALT: 13 U/L (ref 0–53)
AST: 16 U/L (ref 0–37)
Albumin: 3.5 g/dL (ref 3.5–5.2)
Alkaline Phosphatase: 70 U/L (ref 39–117)
Potassium: 3.8 mEq/L (ref 3.5–5.1)
Sodium: 141 mEq/L (ref 135–145)
Total Protein: 8 g/dL (ref 6.0–8.3)

## 2010-12-21 LAB — CK TOTAL AND CKMB (NOT AT ARMC)
CK, MB: 1.6 ng/mL (ref 0.3–4.0)
Relative Index: INVALID (ref 0.0–2.5)

## 2011-08-24 ENCOUNTER — Telehealth: Payer: Self-pay | Admitting: Internal Medicine

## 2011-08-24 ENCOUNTER — Ambulatory Visit (INDEPENDENT_AMBULATORY_CARE_PROVIDER_SITE_OTHER): Payer: Medicare Other | Admitting: Internal Medicine

## 2011-08-24 ENCOUNTER — Encounter: Payer: Self-pay | Admitting: Internal Medicine

## 2011-08-24 VITALS — BP 140/90 | Temp 98.3°F | Ht 71.0 in | Wt 228.0 lb

## 2011-08-24 DIAGNOSIS — K047 Periapical abscess without sinus: Secondary | ICD-10-CM

## 2011-08-24 DIAGNOSIS — K5090 Crohn's disease, unspecified, without complications: Secondary | ICD-10-CM

## 2011-08-24 MED ORDER — PENICILLIN V POTASSIUM 500 MG PO TABS: 500.0000 mg | ORAL_TABLET | Freq: Four times a day (QID) | ORAL | Status: DC

## 2011-08-24 NOTE — Patient Instructions (Signed)
Take your antibiotic as prescribed until ALL of it is gone, but stop if you develop a rash, swelling, or any side effects of the medication.  Contact our office as soon as possible if  there are side effects of the medication.  Call or return to clinic prn if these symptoms worsen or fail to improve as anticipated.  Return in 6 months for follow-up  

## 2011-08-24 NOTE — Telephone Encounter (Signed)
Pt has an abscess in his gum and is wanting to be seen today. Pt requesting you contact him

## 2011-08-24 NOTE — Progress Notes (Signed)
  Subjective:    Patient ID: Kevin Yang, male    DOB: [birth year], 61 y.o.   MRN: [id number]  HPI  61 year old patient who has a history of Crohn's who is on immunosuppressant therapy. He is followed at the [facility] system and does get dental care twice annually. Complaints today  include oral discomfort. There's been no fever he is concerned about a dental abscess. He has lost several teeth in the past   Review of Systems  Constitutional: Negative.        Objective:   Physical Exam  Constitutional: He appears well-developed and well-nourished. No distress.       Blood pressure 140/84  HENT:       There was considerable swelling about his right lower molar. Soft tissues were tender to palpation  Lymphadenopathy:    He has no cervical adenopathy.          Assessment & Plan:   Gingivitis/ possible dental abscess. Will treat with Pen-Vee K 2 g daily in divided dosages for 10 days. It is recommended that he followup with dental medicine Crohn's colitis on immunosuppressant therapy

## 2011-08-24 NOTE — Telephone Encounter (Signed)
Per dr. Amador Cunas - ok to see - double last appt today , make patient aware he may have to wait.

## 2011-08-25 ENCOUNTER — Ambulatory Visit: Payer: Federal, State, Local not specified - PPO | Admitting: Internal Medicine

## 2011-08-27 ENCOUNTER — Telehealth: Payer: Self-pay | Admitting: *Deleted

## 2011-08-27 MED ORDER — METRONIDAZOLE 500 MG PO TABS: ORAL_TABLET | ORAL | Status: DC

## 2011-08-27 NOTE — Telephone Encounter (Signed)
Pt is complaining of diarrhea and nausea and diarrhea from the antibiotic, and also white patches in his mouth???  Asking for some kind of mouth wash.

## 2011-08-27 NOTE — Telephone Encounter (Signed)
Notified pt. 

## 2011-08-27 NOTE — Telephone Encounter (Signed)
Discontinue penicillin Please call in a new prescription for metronidazole 500 mg #20 to take one twice daily Dukes Magic mouthwash  6 ounces 2  teaspoon swish and swallow 4 times daily

## 2012-02-25 ENCOUNTER — Encounter: Payer: Federal, State, Local not specified - PPO | Admitting: Internal Medicine

## 2012-05-16 ENCOUNTER — Ambulatory Visit (INDEPENDENT_AMBULATORY_CARE_PROVIDER_SITE_OTHER): Payer: Medicare Other | Admitting: Internal Medicine

## 2012-05-16 ENCOUNTER — Encounter: Payer: Self-pay | Admitting: Internal Medicine

## 2012-05-16 VITALS — BP 150/86 | HR 90 | Temp 98.1°F | Resp 18 | Ht 69.5 in | Wt 218.0 lb

## 2012-05-16 DIAGNOSIS — Z0000 Encounter for general adult medical examination without abnormal findings: Secondary | ICD-10-CM

## 2012-05-16 DIAGNOSIS — K5090 Crohn's disease, unspecified, without complications: Secondary | ICD-10-CM

## 2012-05-16 NOTE — Progress Notes (Signed)
Subjective:    Patient ID: Kevin Yang, male    DOB: 10-15-1950, 61 y.o.   MRN: [id number]  HPI     61 year old patient who is seen today for a wellness exam. He is followed at [facility] for Crohn's disease and is on Remicade. He has history of asthma which has been stable.  Current Allergies:  No known allergies  Past Medical History:  High cholesterol  Headache  IBD 1997  Asthma  Colonic polyps, hx of   Past Surgical History:  L Shoulder Surgery  partial sigmoid colectomy 2005  L4-5 back fusion in 1995   Family History:  Family History Breast cancer 1st degree relative <50  Family History Diabetes 1st degree relative  Family History Other cancer  Family History of Prostate CA 1st degree relative <50  father died age 56 complications of prostate cancer  mother is in her early 51s. 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 Risk factors, based on past  M,S,F history- cardiovascular as factors include dyslipidemia. 2.  Physical activities:  No exercise restrictions although fairly sedentary  3.  Depression/mood: No history depression or mood disorder  4.  Hearing: No deficits  5.  ADL's: Independent in all aspects of daily living  6.  Fall risk: Low  7.  Home safety: No problems identified  8.  Height weight, and visual acuity; height and weight stable no change in visual acuity  9.  Counseling: Heart healthy diet regular exercise all encouraged  10. Lab orders based on risk factors: Recent laboratory data at the [place]. This will be faxed and reviewed  11. Referral : Follow [facility]  12. Care plan: Heart healthy diet and regular exercise  13. Cognitive assessment:  Alert and oriented with normal affect. No cognitive dysfunction    Review of Systems  Constitutional: Negative for  fever, chills, activity change, appetite change and fatigue.  HENT: Negative for hearing loss, ear pain, congestion, rhinorrhea, sneezing, mouth sores, trouble swallowing, neck pain, neck stiffness, dental problem, voice change, sinus pressure and tinnitus.   Eyes: Negative for photophobia, pain, redness and visual disturbance.  Respiratory: Negative for apnea, cough, choking, chest tightness, shortness of breath and wheezing.   Cardiovascular: Negative for chest pain, palpitations and leg swelling.  Gastrointestinal: Negative for nausea, vomiting, abdominal pain, diarrhea, constipation, blood in stool, abdominal distention, anal bleeding and rectal pain.  Genitourinary: Positive for frequency. Negative for dysuria, urgency, hematuria, flank pain, decreased urine volume, discharge, penile swelling, scrotal swelling, difficulty urinating, genital sores and testicular pain.  Musculoskeletal: Negative for myalgias, back pain, joint swelling, arthralgias and gait problem.  Skin: Negative for color change, rash and wound.  Neurological: Negative for dizziness, tremors, seizures, syncope, facial asymmetry, speech difficulty, weakness, light-headedness, numbness and headaches.  Hematological: Negative for adenopathy. Does not bruise/bleed easily.  Psychiatric/Behavioral: Negative for suicidal ideas, hallucinations, behavioral problems, confusion, sleep disturbance, self-injury, dysphoric mood, decreased concentration and agitation. The patient is not nervous/anxious.        Objective:   Physical Exam  Constitutional: He appears well-developed and well-nourished.  HENT:  Head: Normocephalic and atraumatic.  Right Ear: External ear normal.  Left Ear: External ear normal.  Nose: Nose normal.  Mouth/Throat: Oropharynx is clear and moist.  Eyes: Conjunctivae normal and EOM are normal. Pupils are equal,  round, and reactive to light. No scleral icterus.  Neck: Normal range of motion. Neck supple. No JVD  present. No thyromegaly present.  Cardiovascular: Regular rhythm, normal heart sounds and intact distal pulses.  Exam reveals no gallop and no friction rub.   No murmur heard. Pulmonary/Chest: Effort normal and breath sounds normal. He exhibits no tenderness.  Abdominal: Soft. Bowel sounds are normal. He exhibits no distension and no mass. There is no tenderness.       Lower midline scar  Genitourinary: Prostate normal and penis normal.       Prostate +1 enlarged  Musculoskeletal: Normal range of motion. He exhibits no edema and no tenderness.  Lymphadenopathy:    He has no cervical adenopathy.  Neurological: He is alert. He has normal reflexes. No cranial nerve deficit. Coordination normal.  Skin: Skin is warm and dry. No rash noted.  Psychiatric: He has a normal mood and affect. His behavior is normal.          Assessment & Plan:  Preventive health examination Crohn's disease Asthma stable  Home blood pressure monitoring recommended. Heart healthy of restricted salt diet recommended a regular exercise encouraged. Followup the [facility]. Return here one year or as needed

## 2012-05-16 NOTE — Patient Instructions (Signed)
Limit your sodium (Salt) intake    It is important that you exercise regularly, at least 20 minutes 3 to 4 times per week.  If you develop chest pain or shortness of breath seek  medical attention.  Return in one year for follow-up  Followed at [facility]

## 2012-05-17 ENCOUNTER — Encounter: Payer: Self-pay | Admitting: Internal Medicine

## 2012-12-13 ENCOUNTER — Ambulatory Visit (INDEPENDENT_AMBULATORY_CARE_PROVIDER_SITE_OTHER): Payer: Medicare Other | Admitting: Internal Medicine

## 2012-12-13 ENCOUNTER — Encounter: Payer: Self-pay | Admitting: Internal Medicine

## 2012-12-13 VITALS — BP 124/84 | HR 104 | Temp 98.6°F | Resp 20 | Wt 226.0 lb

## 2012-12-13 DIAGNOSIS — J45909 Unspecified asthma, uncomplicated: Secondary | ICD-10-CM

## 2012-12-13 DIAGNOSIS — J0100 Acute maxillary sinusitis, unspecified: Secondary | ICD-10-CM

## 2012-12-13 MED ORDER — DOXYCYCLINE HYCLATE 100 MG PO TABS: 100.0000 mg | ORAL_TABLET | Freq: Two times a day (BID) | ORAL | Status: DC

## 2012-12-13 NOTE — Patient Instructions (Signed)
    Use saline irrigation, warm  moist compresses and over-the-counter decongestants only as directed.  Call if there is no improvement in 5 to 7 days, or sooner if you develop increasing pain, fever, or any new symptoms.  Take your antibiotic as prescribed until ALL of it is gone, but stop if you develop a rash, swelling, or any side effects of the medication.  Contact our office as soon as possible if  there are side effects of the medication. 

## 2012-12-13 NOTE — Progress Notes (Signed)
Subjective:    Patient ID: Kevin Yang, male    DOB: [birth year], 62 y.o.   MRN: [id number]  HPI  62 year old patient who has history of allergic rhinitis and asthma. He is also been treated for recurrent sinusitis in the past. He has a history also of Crohn's disease and is on immunosuppressant therapy. The past several days she has had worsening right hemifacial pain and congestion. There's been no documented fever. There is been some minimal drainage. He feels this is similar to prior sinus infections in the past. He also describes pain over the right thigh region  History reviewed. No pertinent past medical history.  History   Social History  . Marital Status: Married    Spouse Name: N/A    Number of Children: N/A  . Years of Education: N/A   Occupational History  . Not on file.   Social History Main Topics  . Smoking status: Never Smoker   . Smokeless tobacco: Never Used  . Alcohol Use: No  . Drug Use: No  . Sexually Active: Not on file   Other Topics Concern  . Not on file   Social History Narrative  . No narrative on file    History reviewed. No pertinent past surgical history.  No family history on file.  No Known Allergies  Current Outpatient Prescriptions on File Prior to Visit  Medication Sig Dispense Refill  . albuterol (PROVENTIL HFA;VENTOLIN HFA) 108 (90 BASE) MCG/ACT inhaler Inhale 2 puffs into the lungs every 6 (six) hours as needed.      . flunisolide (NASALIDE) 0.025 % SOLN Inhale 2 sprays into the lungs 2 (two) times daily.      . formoterol (PERFOROMIST) 20 MCG/2ML nebulizer solution Take 20 mcg by nebulization 4 (four) times daily.      . InFLIXimab (REMICADE IV) Inject into the vein.      Marland Kitchen loratadine (CLARITIN) 10 MG tablet Take 10 mg by mouth daily.      . metroNIDAZOLE (FLAGYL) 500 MG tablet One po bid.  20 tablet  0  . mometasone (ASMANEX 30 METERED DOSES) 220 MCG/INH inhaler Inhale 2 puffs into the lungs at bedtime.      . simvastatin  (ZOCOR) 80 MG tablet Take 80 mg by mouth at bedtime.      . triamcinolone cream (KENALOG) 0.1 % Apply topically 2 (two) times daily.       No current facility-administered medications on file prior to visit.    BP 124/84  Pulse 104  Temp(Src) 98.6 F (37 C) (Oral)  Resp 20  Wt 226 lb (102.513 kg)  BMI 32.91 kg/m2  SpO2 96%       Review of Systems  Constitutional: Positive for fatigue. Negative for fever, chills and appetite change.  HENT: Positive for congestion, rhinorrhea and sinus pressure. Negative for hearing loss, ear pain, sore throat, trouble swallowing, neck stiffness, dental problem, voice change and tinnitus.   Eyes: Negative for pain, discharge and visual disturbance.  Respiratory: Negative for cough, chest tightness, wheezing and stridor.   Cardiovascular: Negative for chest pain, palpitations and leg swelling.  Gastrointestinal: Negative for nausea, vomiting, abdominal pain, diarrhea, constipation, blood in stool and abdominal distention.  Genitourinary: Negative for urgency, hematuria, flank pain, discharge, difficulty urinating and genital sores.  Musculoskeletal: Negative for myalgias, back pain, joint swelling, arthralgias and gait problem.  Skin: Negative for rash.  Neurological: Positive for headaches. Negative for dizziness, syncope, speech difficulty, weakness and numbness.  Hematological: Negative for  adenopathy. Does not bruise/bleed easily.  Psychiatric/Behavioral: Negative for behavioral problems and dysphoric mood. The patient is not nervous/anxious.        Objective:   Physical Exam  Constitutional: He is oriented to person, place, and time. He appears well-developed.  HENT:  Head: Normocephalic.  Right Ear: External ear normal.  Left Ear: External ear normal.  Right maxillary sinus discomfort  Eyes: Conjunctivae and EOM are normal.  Neck: Normal range of motion.  Cardiovascular: Normal rate and normal heart sounds.   Pulmonary/Chest: Breath  sounds normal.  Abdominal: Bowel sounds are normal.  Musculoskeletal: Normal range of motion. He exhibits no edema and no tenderness.  Neurological: He is alert and oriented to person, place, and time.  Psychiatric: He has a normal mood and affect. His behavior is normal.          Assessment & Plan:   Early right maxillary sinusitis. Will treat with expectorants decongestants saline irrigation and antibiotic therapy Asthma stable

## 2013-07-04 ENCOUNTER — Emergency Department (HOSPITAL_COMMUNITY): Payer: Medicare Other

## 2013-07-04 ENCOUNTER — Emergency Department (HOSPITAL_COMMUNITY)
Admission: EM | Admit: 2013-07-04 | Discharge: 2013-07-04 | Disposition: A | Discharge status: Home / Self Care | Payer: Medicare Other | Attending: Emergency Medicine | Admitting: Emergency Medicine

## 2013-07-04 ENCOUNTER — Encounter (HOSPITAL_COMMUNITY): Payer: Self-pay | Admitting: Emergency Medicine

## 2013-07-04 DIAGNOSIS — J45901 Unspecified asthma with (acute) exacerbation: Principal | ICD-10-CM

## 2013-07-04 DIAGNOSIS — Z79899 Other long term (current) drug therapy: Secondary | ICD-10-CM | POA: Insufficient documentation

## 2013-07-04 DIAGNOSIS — IMO0002 Reserved for concepts with insufficient information to code with codable children: Secondary | ICD-10-CM | POA: Insufficient documentation

## 2013-07-04 DIAGNOSIS — Z8719 Personal history of other diseases of the digestive system: Secondary | ICD-10-CM | POA: Insufficient documentation

## 2013-07-04 DIAGNOSIS — J441 Chronic obstructive pulmonary disease with (acute) exacerbation: Secondary | ICD-10-CM

## 2013-07-04 HISTORY — DX: Crohn's disease, unspecified, without complications: K50.90

## 2013-07-04 HISTORY — DX: Chronic obstructive pulmonary disease, unspecified: J44.9

## 2013-07-04 HISTORY — DX: Unspecified asthma, uncomplicated: J45.909

## 2013-07-04 MED ORDER — PREDNISONE 20 MG PO TABS
60.0000 mg | ORAL_TABLET | Freq: Once | ORAL | Status: AC
  Administered 2013-07-04: 60 mg via ORAL
  Filled 2013-07-04: qty 3

## 2013-07-04 MED ORDER — PREDNISONE 50 MG PO TABS: 50.0000 mg | ORAL_TABLET | Freq: Every day | ORAL | Status: DC

## 2013-07-04 MED ORDER — MOXIFLOXACIN HCL 400 MG PO TABS: 400.0000 mg | ORAL_TABLET | Freq: Every day | ORAL | Status: DC

## 2013-07-04 MED ORDER — ALBUTEROL SULFATE (2.5 MG/3ML) 0.083% IN NEBU
5.0000 mg | INHALATION_SOLUTION | Freq: Once | RESPIRATORY_TRACT | Status: AC
  Administered 2013-07-04: 5 mg via RESPIRATORY_TRACT
  Filled 2013-07-04: qty 6

## 2013-07-04 NOTE — ED Provider Notes (Signed)
CSN: [id number]     Arrival date & time 07/04/13  0401 History   First MD Initiated Contact with Patient 07/04/13 [phone]     Chief Complaint  Patient presents with  . Cough   (Consider location/radiation/quality/duration/timing/severity/associated sxs/prior Treatment) HPI Pt presenting with c/o cough and nasal congestion.  Cough is productive of greenish /yellow sputum.  Symptoms started yesterday.  No chest pain.  No fever/chills.  No sore throat or difficulty breathing.  Pt has hx of COPD.  No leg swelling. No hx of DVT/PE, no hx of travel/trauma/surgery.  Pt tried his albuterol inhaler with some relief.  Has also been drinking tea with honey and taking tylenol cold.  There are no other associated systemic symptoms, there are no other alleviating or modifying factors.   Past Medical History  Diagnosis Date  . Asthma   . COPD (chronic obstructive pulmonary disease)   . Crohn's disease    Past Surgical History  Procedure Laterality Date  . Back surgery    . Colon surgery    . Shoulder surgery     No family history on file. History  Substance Use Topics  . Smoking status: Never Smoker   . Smokeless tobacco: Never Used  . Alcohol Use: No    Review of Systems ROS reviewed and all otherwise negative except for mentioned in HPI  Allergies  Review of patient's allergies indicates no known allergies.  Home Medications   Current Outpatient Rx  Name  Route  Sig  Dispense  Refill  . albuterol (PROVENTIL HFA;VENTOLIN HFA) 108 (90 BASE) MCG/ACT inhaler   Inhalation   Inhale 2 puffs into the lungs every 6 (six) hours as needed for wheezing or shortness of breath.          . budesonide-formoterol (SYMBICORT) 160-4.5 MCG/ACT inhaler   Inhalation   Inhale 2 puffs into the lungs 2 (two) times daily.         . cyclobenzaprine (FLEXERIL) 10 MG tablet   Oral   Take 10 mg by mouth at bedtime as needed for muscle spasms.         . diclofenac (VOLTAREN) 75 MG EC tablet   Oral  Take 75 mg by mouth 2 (two) times daily.         Marland Kitchen dicyclomine (BENTYL) 10 MG capsule   Oral   Take 10 mg by mouth 3 (three) times daily before meals. As Needed         . flunisolide (NASALIDE) 0.025 % SOLN   Inhalation   Inhale 2 sprays into the lungs 2 (two) times daily.         . formoterol (PERFOROMIST) 20 MCG/2ML nebulizer solution   Nebulization   Take 20 mcg by nebulization 4 (four) times daily.         Marland Kitchen HYDROcodone-acetaminophen (NORCO/VICODIN) 5-325 MG per tablet   Oral   Take 1 tablet by mouth 2 (two) times daily as needed for pain.         . InFLIXimab (REMICADE IV)   Intravenous   Inject into the vein every 30 (thirty) days.          Marland Kitchen loratadine (CLARITIN) 10 MG tablet   Oral   Take 10 mg by mouth daily.         . mometasone (ASMANEX 30 METERED DOSES) 220 MCG/INH inhaler   Inhalation   Inhale 2 puffs into the lungs at bedtime.         . sildenafil (VIAGRA) 50  MG tablet   Oral   Take 50 mg by mouth as needed for erectile dysfunction.         . simvastatin (ZOCOR) 80 MG tablet   Oral   Take 80 mg by mouth at bedtime.         . tamsulosin (FLOMAX) 0.4 MG CAPS   Oral   Take 0.4 mg by mouth at bedtime.         . triamcinolone cream (KENALOG) 0.1 %   Topical   Apply topically 2 (two) times daily.         Marland Kitchen. moxifloxacin (AVELOX) 400 MG tablet   Oral   Take 1 tablet (400 mg total) by mouth daily at 8 pm.   10 tablet   0   . predniSONE (DELTASONE) 50 MG tablet   Oral   Take 1 tablet (50 mg total) by mouth daily.   4 tablet   0    BP 121/82  Pulse 91  Temp(Src) 98.2 F (36.8 C) (Oral)  Resp 18  SpO2 94% Vitals reviewed Physical Exam Physical Examination: General appearance - alert, well appearing, and in no distress Mental status - alert, oriented to person, place, and time Eyes - no scleral icterus, no conjunctival injection Mouth - mucous membranes moist, pharynx normal without lesions Chest - bilateral mild exp  wheezes, rales or rhonchi, symmetric air entry, normal respiratory effort Heart - normal rate, regular rhythm, normal S1, S2, no murmurs, rubs, clicks or gallops Abdomen - soft, nontender, nondistended, no masses or organomegaly Extremities - peripheral pulses normal, no pedal edema, no clubbing or cyanosis Skin - normal coloration and turgor, no rashes  ED Course  Procedures (including critical care time) Labs Review Labs Reviewed - No data to display Imaging Review Dg Chest 2 View  07/04/2013   CLINICAL DATA:  Cough.  EXAM: CHEST  2 VIEW  COMPARISON:  12/21/2010  FINDINGS: Normal heart size and mediastinal contours. No acute infiltrate or edema. No effusion or pneumothorax. No acute osseous findings.  IMPRESSION: Negative for pneumonia.   Electronically Signed   By: Tiburcio PeaJonathan  Watts M.D.   On: 07/04/2013 06:22    EKG Interpretation   None       MDM   1. COPD exacerbation    Pt presenting with cough, nasal congestion, mild wheezing on exam and change in sputum. No pnuemonia on cxr.   Xray images reviewed and interpreted by me as well.  Pt treated with alb neb, steroids, dc/ with steroids and avelox to use albuterol every 4 hours.  Discharged with strict return precautions.  Pt agreeable with plan.     Ethelda ChickMartha K Linker, MD 07/04/13 [phone]

## 2013-07-04 NOTE — Discharge Instructions (Signed)
Return to the ED with any concerns including difficulty breathing despite using albuterol every 4 hours, not drinking fluids, decreased urine output, vomiting and not able to keep down liquids or medications, decreased level of alertness/lethargy, or any other alarming symptoms °

## 2013-07-04 NOTE — ED Notes (Signed)
Pt. reports productive cough , chest congestion and sore throat onset this weekend , denies fever or chills.

## 2014-03-20 ENCOUNTER — Ambulatory Visit: Payer: Medicare Other

## 2014-07-05 ENCOUNTER — Ambulatory Visit (INDEPENDENT_AMBULATORY_CARE_PROVIDER_SITE_OTHER): Payer: Medicare Other | Admitting: Internal Medicine

## 2014-07-05 ENCOUNTER — Encounter: Payer: Self-pay | Admitting: Internal Medicine

## 2014-07-05 VITALS — BP 150/90 | HR 78 | Temp 98.2°F | Resp 20 | Ht 68.75 in | Wt 221.0 lb

## 2014-07-05 DIAGNOSIS — Z0000 Encounter for general adult medical examination without abnormal findings: Secondary | ICD-10-CM | POA: Diagnosis not present

## 2014-07-05 DIAGNOSIS — E7800 Pure hypercholesterolemia, unspecified: Secondary | ICD-10-CM

## 2014-07-05 DIAGNOSIS — K5090 Crohn's disease, unspecified, without complications: Secondary | ICD-10-CM

## 2014-07-05 DIAGNOSIS — Z86010 Personal history of colonic polyps: Secondary | ICD-10-CM | POA: Diagnosis not present

## 2014-07-05 NOTE — Patient Instructions (Signed)
It is important that you exercise regularly, at least 20 minutes 3 to 4 times per week.  If you develop chest pain or shortness of breath seek  medical attention.  You need to lose weight.  Consider a lower calorie diet and regular exercise.  Return in one year for follow-up Cardiac Diet This diet can help prevent heart disease and stroke. Many factors influence your heart health, including eating and exercise habits. Coronary risk rises a lot with abnormal blood fat (lipid) levels. Cardiac meal planning includes limiting unhealthy fats, increasing healthy fats, and making other small dietary changes. General guidelines are as follows:  Adjust calorie intake to reach and maintain desirable body weight.  Limit total fat intake to less than 30% of total calories. Saturated fat should be less than 7% of calories.  Saturated fats are found in animal products and in some vegetable products. Saturated vegetable fats are found in coconut oil, cocoa butter, palm oil, and palm kernel oil. Read labels carefully to avoid these products as much as possible. Use butter in moderation. Choose tub margarines and oils that have 2 grams of fat or less. Good cooking oils are canola and olive oils.  Practice low-fat cooking techniques. Do not fry food. Instead, broil, bake, boil, steam, grill, roast on a rack, stir-fry, or microwave it. Other fat reducing suggestions include:  Remove the skin from poultry.  Remove all visible fat from meats.  Skim the fat off stews, soups, and gravies before serving them.  Steam vegetables in water or broth instead of sauting them in fat.  Avoid foods with trans fat (or hydrogenated oils), such as commercially fried foods and commercially baked goods. Commercial shortening and deep-frying fats will contain trans fat.  Increase intake of fruits, vegetables, whole grains, and legumes to replace foods high in fat.  Increase consumption of nuts, legumes, and seeds to at least  4 servings weekly. One serving of a legume equals  cup, and 1 serving of nuts or seeds equals  cup.  Choose whole grains more often. Have 3 servings per day (a serving is 1 ounce [oz]).  Eat 4 to 5 servings of vegetables per day. A serving of vegetables is 1 cup of raw leafy vegetables;  cup of raw or cooked cut-up vegetables;  cup of vegetable juice.  Eat 4 to 5 servings of fruit per day. A serving of fruit is 1 medium whole fruit;  cup of dried fruit;  cup of fresh, frozen, or canned fruit;  cup of 100% fruit juice.  Increase your intake of dietary fiber to 20 to 30 grams per day. Insoluble fiber may help lower your risk of heart disease and may help curb your appetite.  Soluble fiber binds cholesterol to be removed from the blood. Foods high in soluble fiber are dried beans, citrus fruits, oats, apples, bananas, broccoli, Brussels sprouts, and eggplant.  Try to include foods fortified with plant sterols or stanols, such as yogurt, breads, juices, or margarines. Choose several fortified foods to achieve a daily intake of 2 to 3 grams of plant sterols or stanols.  Foods with omega-3 fats can help reduce your risk of heart disease. Aim to have a 3.5 oz portion of fatty fish twice per week, such as salmon, mackerel, albacore tuna, sardines, lake trout, or herring. If you wish to take a fish oil supplement, choose one that contains 1 gram of both DHA and EPA.  Limit processed meats to 2 servings (3 oz portion) weekly.  weekly.  Limit the sodium in your diet to 1500 milligrams (mg) per day. If you have high blood pressure, talk to a registered dietitian about a DASH (Dietary Approaches to Stop Hypertension) eating plan.  Limit sweets and beverages with added sugar, such as soda, to no more than 5 servings per week. One serving is:   1 tablespoon sugar.  1 tablespoon jelly or jam.   cup sorbet.  1 cup lemonade.   cup regular soda. CHOOSING FOODS Starches  Allowed: Breads: All  kinds (wheat, rye, raisin, white, oatmeal, Italian, French, and English muffin bread). Low-fat rolls: English muffins, frankfurter and hamburger buns, bagels, pita bread, tortillas (not fried). Pancakes, waffles, biscuits, and muffins made with recommended oil.  Avoid: Products made with saturated or trans fats, oils, or whole milk products. Butter rolls, cheese breads, croissants. Commercial doughnuts, muffins, sweet rolls, biscuits, waffles, pancakes, store-bought mixes. Crackers  Allowed: Low-fat crackers and snacks: Animal, graham, rye, saltine (with recommended oil, no lard), oyster, and matzo crackers. Bread sticks, melba toast, rusks, flatbread, pretzels, and light popcorn.  Avoid: High-fat crackers: cheese crackers, butter crackers, and those made with coconut, palm oil, or trans fat (hydrogenated oils). Buttered popcorn. Cereals  Allowed: Hot or cold whole-grain cereals.  Avoid: Cereals containing coconut, hydrogenated vegetable fat, or animal fat. Potatoes / Pasta / Rice  Allowed: All kinds of potatoes, rice, and pasta (such as macaroni, spaghetti, and noodles).  Avoid: Pasta or rice prepared with cream sauce or high-fat cheese. Chow mein noodles, French fries. Vegetables  Allowed: All vegetables and vegetable juices.  Avoid: Fried vegetables. Vegetables in cream, butter, or high-fat cheese sauces. Limit coconut. Fruit in cream or custard. Protein  Allowed: Limit your intake of meat, seafood, and poultry to no more than 6 oz (cooked weight) per day. All lean, well-trimmed beef, veal, pork, and lamb. All chicken and turkey without skin. All fish and shellfish. Wild game: wild duck, rabbit, pheasant, and venison. Egg whites or low-cholesterol egg substitutes may be used as desired. Meatless dishes: recipes with dried beans, peas, lentils, and tofu (soybean curd). Seeds and nuts: all seeds and most nuts.  Avoid: Prime grade and other heavily marbled and fatty meats, such as short  ribs, spare ribs, rib eye roast or steak, frankfurters, sausage, bacon, and high-fat luncheon meats, mutton. Caviar. Commercially fried fish. Domestic duck, goose, venison sausage. Organ meats: liver, gizzard, heart, chitterlings, brains, kidney, sweetbreads. Dairy  Allowed: Low-fat cheeses: nonfat or low-fat cottage cheese (1% or 2% fat), cheeses made with part skim milk, such as mozzarella, farmers, string, or ricotta. (Cheeses should be labeled no more than 2 to 6 grams fat per oz.). Skim (or 1%) milk: liquid, powdered, or evaporated. Buttermilk made with low-fat milk. Drinks made with skim or low-fat milk or cocoa. Chocolate milk or cocoa made with skim or low-fat (1%) milk. Nonfat or low-fat yogurt.  Avoid: Whole milk cheeses, including colby, cheddar, muenster, Monterey Jack, Havarti, Brie, Camembert, American, Swiss, and blue. Creamed cottage cheese, cream cheese. Whole milk and whole milk products, including buttermilk or yogurt made from whole milk, drinks made from whole milk. Condensed milk, evaporated whole milk, and 2% milk. Soups and Combination Foods  Allowed: Low-fat low-sodium soups: broth, dehydrated soups, homemade broth, soups with the fat removed, homemade cream soups made with skim or low-fat milk. Low-fat spaghetti, lasagna, chili, and Spanish rice if low-fat ingredients and low-fat cooking techniques are used.  Avoid: Cream soups made with whole milk, cream, or high-fat cheese. All   other soups. Desserts and Sweets  Allowed: Sherbet, fruit ices, gelatins, meringues, and angel food cake. Homemade desserts with recommended fats, oils, and milk products. Jam, jelly, honey, marmalade, sugars, and syrups. Pure sugar candy, such as gum drops, hard candy, jelly beans, marshmallows, mints, and small amounts of dark chocolate.  Avoid: Commercially prepared cakes, pies, cookies, frosting, pudding, or mixes for these products. Desserts containing whole milk products, chocolate, coconut,  lard, palm oil, or palm kernel oil. Ice cream or ice cream drinks. Candy that contains chocolate, coconut, butter, hydrogenated fat, or unknown ingredients. Buttered syrups. Fats and Oils  Allowed: Vegetable oils: safflower, sunflower, corn, soybean, cottonseed, sesame, canola, olive, or peanut. Non-hydrogenated margarines. Salad dressing or mayonnaise: homemade or commercial, made with a recommended oil. Low or nonfat salad dressing or mayonnaise.  Limit added fats and oils to 6 to 8 tsp per day (includes fats used in cooking, baking, salads, and spreads on bread). Remember to count the "hidden fats" in foods.  Avoid: Solid fats and shortenings: butter, lard, salt pork, bacon drippings. Gravy containing meat fat, shortening, or suet. Cocoa butter, coconut. Coconut oil, palm oil, palm kernel oil, or hydrogenated oils: these ingredients are often used in bakery products, nondairy creamers, whipped toppings, candy, and commercially fried foods. Read labels carefully. Salad dressings made of unknown oils, sour cream, or cheese, such as blue cheese and Roquefort. Cream, all kinds: half-and-half, light, heavy, or whipping. Sour cream or cream cheese (even if "light" or low-fat). Nondairy cream substitutes: coffee creamers and sour cream substitutes made with palm, palm kernel, hydrogenated oils, or coconut oil. Beverages  Allowed: Coffee (regular or decaffeinated), tea. Diet carbonated beverages, mineral water. Alcohol: Check with your caregiver. Moderation is recommended.  Avoid: Whole milk, regular sodas, and juice drinks with added sugar. Condiments  Allowed: All seasonings and condiments. Cocoa powder. "Cream" sauces made with recommended ingredients.  Avoid: Carob powder made with hydrogenated fats. SAMPLE MENU Breakfast   cup orange juice   cup oatmeal  1 slice toast  1 tsp margarine  1 cup skim milk Lunch  Turkey sandwich with 2 oz turkey, 2 slices bread  Lettuce and tomato  slices  Fresh fruit  Carrot sticks  Coffee or tea Snack  Fresh fruit or low-fat crackers Dinner  3 oz lean ground beef  1 baked potato  1 tsp margarine   cup asparagus  Lettuce salad  1 tbs non-creamy dressing   cup peach slices  1 cup skim milk Document Released: 02/25/2008 Document Revised: 11/17/2011 Document Reviewed: 07/18/2013 [employer] Patient Information 2015 [employer]. This information is not intended to replace advice given to you by your health care provider. Make sure you discuss any questions you have with your health care provider. Health Maintenance A healthy lifestyle and preventative care can promote health and wellness.  Maintain regular health, dental, and eye exams.  Eat a healthy diet. Foods like vegetables, fruits, whole grains, low-fat dairy products, and lean protein foods contain the nutrients you need and are low in calories. Decrease your intake of foods high in solid fats, added sugars, and salt. Get information about a proper diet from your health care provider, if necessary.  Regular physical exercise is one of the most important things you can do for your health. Most adults should get at least 150 minutes of moderate-intensity exercise (any activity that increases your heart rate and causes you to sweat) each week. In addition, most adults need muscle-strengthening exercises on 2 or more days a   week.   Maintain a healthy weight. The body mass index (BMI) is a screening tool to identify possible weight problems. It provides an estimate of body fat based on height and weight. Your health care provider can find your BMI and can help you achieve or maintain a healthy weight. For males 20 years and older:  A BMI below 18.5 is considered underweight.  A BMI of 18.5 to 24.9 is normal.  A BMI of 25 to 29.9 is considered overweight.  A BMI of 30 and above is considered obese.  Maintain normal blood lipids and cholesterol by exercising and  minimizing your intake of saturated fat. Eat a balanced diet with plenty of fruits and vegetables. Blood tests for lipids and cholesterol should begin at age 20 and be repeated every 5 years. If your lipid or cholesterol levels are high, you are over age 50, or you are at high risk for heart disease, you may need your cholesterol levels checked more frequently.Ongoing high lipid and cholesterol levels should be treated with medicines if diet and exercise are not working.  If you smoke, find out from your health care provider how to quit. If you do not use tobacco, do not start.  Lung cancer screening is recommended for adults aged 55-80 years who are at high risk for developing lung cancer because of a history of smoking. A yearly low-dose CT scan of the lungs is recommended for people who have at least a 30-pack-year history of smoking and are current smokers or have quit within the past 15 years. A pack year of smoking is smoking an average of 1 pack of cigarettes a day for 1 year (for example, a 30-pack-year history of smoking could mean smoking 1 pack a day for 30 years or 2 packs a day for 15 years). Yearly screening should continue until the smoker has stopped smoking for at least 15 years. Yearly screening should be stopped for people who develop a health problem that would prevent them from having lung cancer treatment.  If you choose to drink alcohol, do not have more than 2 drinks per day. One drink is considered to be 12 oz (360 mL) of beer, 5 oz (150 mL) of wine, or 1.5 oz (45 mL) of liquor.  Avoid the use of street drugs. Do not share needles with anyone. Ask for help if you need support or instructions about stopping the use of drugs.  High blood pressure causes heart disease and increases the risk of stroke. Blood pressure should be checked at least every 1-2 years. Ongoing high blood pressure should be treated with medicines if weight loss and exercise are not effective.  If you are  45-79 years old, ask your health care provider if you should take aspirin to prevent heart disease.  Diabetes screening involves taking a blood sample to check your fasting blood sugar level. This should be done once every 3 years after age 45 if you are at a normal weight and without risk factors for diabetes. Testing should be considered at a younger age or be carried out more frequently if you are overweight and have at least 1 risk factor for diabetes.  Colorectal cancer can be detected and often prevented. Most routine colorectal cancer screening begins at the age of 50 and continues through age 75. However, your health care provider may recommend screening at an earlier age if you have risk factors for colon cancer. On a yearly basis, your health care provider may provide   home test kits to check for hidden blood in the stool. A small camera at the end of a tube may be used to directly examine the colon (sigmoidoscopy or colonoscopy) to detect the earliest forms of colorectal cancer. Talk to your health care provider about this at age 50 when routine screening begins. A direct exam of the colon should be repeated every 5-10 years through age 75, unless early forms of precancerous polyps or small growths are found.  People who are at an increased risk for hepatitis B should be screened for this virus. You are considered at high risk for hepatitis B if:  You were born in a country where hepatitis B occurs often. Talk with your health care provider about which countries are considered high risk.  Your parents were born in a high-risk country and you have not received a shot to protect against hepatitis B (hepatitis B vaccine).  You have HIV or AIDS.  You use needles to inject street drugs.  You live with, or have sex with, someone who has hepatitis B.  You are a man who has sex with other men (MSM).  You get hemodialysis treatment.  You take certain medicines for conditions like cancer, organ  transplantation, and autoimmune conditions.  Hepatitis C blood testing is recommended for all people born from 1945 through 1965 and any individual with known risk factors for hepatitis C.  Healthy men should no longer receive prostate-specific antigen (PSA) blood tests as part of routine cancer screening. Talk to your health care provider about prostate cancer screening.  Testicular cancer screening is not recommended for adolescents or adult males who have no symptoms. Screening includes self-exam, a health care provider exam, and other screening tests. Consult with your health care provider about any symptoms you have or any concerns you have about testicular cancer.  Practice safe sex. Use condoms and avoid high-risk sexual practices to reduce the spread of sexually transmitted infections (STIs).  You should be screened for STIs, including gonorrhea and chlamydia if:  You are sexually active and are younger than 24 years.  You are older than 24 years, and your health care provider tells you that you are at risk for this type of infection.  Your sexual activity has changed since you were last screened, and you are at an increased risk for chlamydia or gonorrhea. Ask your health care provider if you are at risk.  If you are at risk of being infected with HIV, it is recommended that you take a prescription medicine daily to prevent HIV infection. This is called pre-exposure prophylaxis (PrEP). You are considered at risk if:  You are a man who has sex with other men (MSM).  You are a heterosexual man who is sexually active with multiple partners.  You take drugs by injection.  You are sexually active with a partner who has HIV.  Talk with your health care provider about whether you are at high risk of being infected with HIV. If you choose to begin PrEP, you should first be tested for HIV. You should then be tested every 3 months for as long as you are taking PrEP.  Use sunscreen. Apply  sunscreen liberally and repeatedly throughout the day. You should seek shade when your shadow is shorter than you. Protect yourself by wearing long sleeves, pants, a wide-brimmed hat, and sunglasses year round whenever you are outdoors.  Tell your health care provider of new moles or changes in moles, especially if there is a   change in shape or color. Also, tell your health care provider if a mole is larger than the size of a pencil eraser.  A one-time screening for abdominal aortic aneurysm (AAA) and surgical repair of large AAAs by ultrasound is recommended for men aged 65-75 years who are current or former smokers.  Stay current with your vaccines (immunizations). Document Released: 11/14/2007 Document Revised: 05/23/2013 Document Reviewed: 10/13/2010 [employer] Patient Information 2015 [employer]. This information is not intended to replace advice given to you by your health care provider. Make sure you discuss any questions you have with your health care provider.  

## 2014-07-05 NOTE — Progress Notes (Signed)
Pre visit review using our clinic review tool, if applicable. No additional management support is needed unless otherwise documented below in the visit note. 

## 2014-07-05 NOTE — Progress Notes (Signed)
Subjective:    Patient ID: Kevin Yang, male    DOB: 1951/01/09, 64 y.o.   MRN: [id number]  HPI   Subjective:    Patient ID: Kevin Yang, male    DOB: July 10, 1950, 64 y.o.   MRN: [id number]  HPI    64 -year-old patient who is seen today for a wellness exam.  He is followed at [facility] for Crohn's disease and has been on Remicade. He has history of asthma which has been stable.  Current Allergies:  No known allergies   Past Medical History:  High cholesterol  Headache  IBD 1997  Asthma  Colonic polyps, hx of   Past Surgical History:  L Shoulder Surgery  partial sigmoid colectomy 2005  L4-5 back fusion in 1995   Family History:  Family History Breast cancer 1st degree relative <50  Family History Diabetes 1st degree relative  Family History Other cancer  Family History of Prostate CA 1st degree relative <50  father died age 57 complications of prostate cancer  mother is in her early 71s. 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 Risk factors, based on past  M,S,F history- cardiovascular as factors include dyslipidemia. 2.  Physical activities:  No exercise restrictions although fairly sedentary  3.  Depression/mood: No history depression or mood disorder  4.  Hearing: No deficits  5.  ADL's: Independent in all aspects of daily living  6.  Fall risk: Low  7.  Home safety: No problems identified  8.  Height weight, and visual acuity; height and weight stable no change in visual acuity  9.  Counseling: Heart healthy diet regular exercise all encouraged  10. Lab orders based on risk factors: Recent laboratory data at the [place]. This will be faxed and reviewed  11. Referral : Follow [facility]  12. Care plan: Heart healthy diet and regular exercise  13. Cognitive assessment:   Alert and oriented with normal affect. No cognitive dysfunction  14.  Preventive services will include annual health assessment with lab.  Will have screening colonoscopies every 5 years due to his history of Crohn's disease. Patient was provided with a written and personalized care plan  15.  Provider list includes gastroenterology primary care ophthalmology and radiology    Review of Systems  Constitutional: Negative for fever, chills, activity change, appetite change and fatigue.  HENT: Negative for hearing loss, ear pain, congestion, rhinorrhea, sneezing, mouth sores, trouble swallowing, neck pain, neck stiffness, dental problem, voice change, sinus pressure and tinnitus.   Eyes: Negative for photophobia, pain, redness and visual disturbance.  Respiratory: Negative for apnea, cough, choking, chest tightness, shortness of breath and wheezing.   Cardiovascular: Negative for chest pain, palpitations and leg swelling.  Gastrointestinal: Negative for nausea, vomiting, abdominal pain, diarrhea, constipation, blood in stool, abdominal distention, anal bleeding and rectal pain.  Genitourinary: Positive for frequency. Negative for dysuria, urgency, hematuria, flank pain, decreased urine volume, discharge, penile swelling, scrotal swelling, difficulty urinating, genital sores and testicular pain.  Musculoskeletal: Negative for myalgias, back pain, joint swelling, arthralgias and gait problem.  Skin: Negative for color change, rash and wound.  Neurological: Negative for dizziness, tremors, seizures, syncope, facial asymmetry, speech difficulty, weakness, light-headedness, numbness and headaches.  Hematological: Negative for adenopathy. Does not bruise/bleed easily.  Psychiatric/Behavioral: Negative for suicidal ideas,  hallucinations, behavioral problems, confusion, sleep disturbance, self-injury, dysphoric mood, decreased concentration and agitation. The patient is not nervous/anxious.          Objective:   Physical Exam  Constitutional: He appears well-developed and well-nourished.  HENT:  Head: Normocephalic and atraumatic.  Right Ear: External ear normal.  Left Ear: External ear normal.  Nose: Nose normal.  Mouth/Throat: Oropharynx is clear and moist.  Eyes: Conjunctivae normal and EOM are normal. Pupils are equal, round, and reactive to light. No scleral icterus.  Neck: Normal range of motion. Neck supple. No JVD present. No thyromegaly present.  Cardiovascular: Regular rhythm, normal heart sounds and intact distal pulses.  Exam reveals no gallop and no friction rub.   No murmur heard. Pulmonary/Chest: Effort normal and breath sounds normal. He exhibits no tenderness.  Abdominal: Soft. Bowel sounds are normal. He exhibits no distension and no mass. There is no tenderness.       Lower midline scar  Genitourinary: Prostate normal and penis normal.       Prostate +1 enlarged  Musculoskeletal: Normal range of motion. He exhibits no edema and no tenderness.  Lymphadenopathy:    He has no cervical adenopathy.  Neurological: He is alert. He has normal reflexes. No cranial nerve deficit. Coordination normal.  Skin: Skin is warm and dry. No rash noted.  Psychiatric: He has a normal mood and affect. His behavior is normal.          Assessment & Plan:  Preventive health examination Crohn's disease Asthma stable  Home blood pressure monitoring recommended. Heart healthy of restricted salt diet recommended a regular exercise encouraged. Followup the [facility]. Return here one year or as needed   Review of Systems     Objective:   Physical Exam        Assessment & Plan:

## 2015-07-10 ENCOUNTER — Encounter: Payer: Medicare Other | Admitting: Internal Medicine

## 2015-08-19 ENCOUNTER — Ambulatory Visit (INDEPENDENT_AMBULATORY_CARE_PROVIDER_SITE_OTHER): Payer: Medicare Other | Admitting: Family Medicine

## 2015-08-19 ENCOUNTER — Encounter: Payer: Self-pay | Admitting: Family Medicine

## 2015-08-19 VITALS — BP 137/77 | HR 97 | Temp 100.2°F | Ht 68.75 in | Wt 229.0 lb

## 2015-08-19 DIAGNOSIS — J020 Streptococcal pharyngitis: Secondary | ICD-10-CM | POA: Diagnosis not present

## 2015-08-19 DIAGNOSIS — R509 Fever, unspecified: Secondary | ICD-10-CM

## 2015-08-19 LAB — POCT INFLUENZA A/B
INFLUENZA A, POC: NEGATIVE
INFLUENZA B, POC: NEGATIVE

## 2015-08-19 LAB — POCT RAPID STREP A (OFFICE): RAPID STREP A SCREEN: NEGATIVE

## 2015-08-19 MED ORDER — CEPHALEXIN 500 MG PO CAPS: 500.0000 mg | ORAL_CAPSULE | Freq: Three times a day (TID) | ORAL | Status: AC

## 2015-08-19 NOTE — Progress Notes (Signed)
   Subjective:    Patient ID: Kevin Yang, male    DOB: Nov 29, 1950, 65 y.o.   MRN: [id number]  HPI Here for 3 days of a severe ST, fevers, HA, and some nausea witout coughing. No cough. On Ibuprofen.    Review of Systems  Constitutional: Positive for fever.  HENT: Positive for sore throat. Negative for congestion, ear pain and postnasal drip.   Eyes: Negative.   Respiratory: Negative.        Objective:   Physical Exam  Constitutional: He appears well-developed and well-nourished.  HENT:  Right Ear: External ear normal.  Left Ear: External ear normal.  Nose: Nose normal.  Posterior OP is red and swollen, no exudate   Eyes: Conjunctivae are normal.  Neck: Neck supple.  Shotty tender AC nodes   Pulmonary/Chest: Effort normal and breath sounds normal. No respiratory distress. He has no wheezes. He has no rales.          Assessment & Plan:  Pharyngitis, likely strep. Treat with Keflex.

## 2015-08-19 NOTE — Progress Notes (Signed)
Pre visit review using our clinic review tool, if applicable. No additional management support is needed unless otherwise documented below in the visit note. 

## 2015-09-17 ENCOUNTER — Ambulatory Visit (INDEPENDENT_AMBULATORY_CARE_PROVIDER_SITE_OTHER): Payer: Medicare Other | Admitting: Internal Medicine

## 2015-09-17 ENCOUNTER — Encounter: Payer: Self-pay | Admitting: Internal Medicine

## 2015-09-17 VITALS — BP 160/100 | HR 74 | Temp 98.4°F | Resp 20 | Ht 68.75 in | Wt 227.0 lb

## 2015-09-17 DIAGNOSIS — K5000 Crohn's disease of small intestine without complications: Secondary | ICD-10-CM

## 2015-09-17 DIAGNOSIS — J4521 Mild intermittent asthma with (acute) exacerbation: Secondary | ICD-10-CM | POA: Diagnosis not present

## 2015-09-17 NOTE — Patient Instructions (Signed)
Limit your sodium (Salt) intake  Please check your blood pressure on a regular basis.  If it is consistently greater than 150/90, please make an office appointment.    It is important that you exercise regularly, at least 20 minutes 3 to 4 times per week.  If you develop chest pain or shortness of breath seek  medical attention.  Return in 3 months for follow-up  

## 2015-09-17 NOTE — Progress Notes (Signed)
Subjective:    Patient ID: Kevin Yang, male    DOB: Nov 14, 1950, 65 y.o.   MRN: [id number]  HPI  65 year old patient who is seen today following a recent hospital discharge.  He has a long history of chronic asthma and is followed also at the [facility] center by primary care as well as pulmonary medicine.  He has been on maintenance medical patient, including LABA and ICS.  While visiting in [place].  He was admitted with a COPD exacerbation.  Due to persistent hypoxemia.  He required a hospital stay.  He was treated with azithromycin as well as a prednisone Dosepak.  Hospital records reviewed.  He was discharged on March 26.  He feels that he is back to baseline  No prior history of hypertension.  He states blood pressure readings during his brief hospital stay were normal.  He has a history of Crohn's disease and is followed closely by GI.  Past Medical History  Diagnosis Date  . Asthma   . COPD (chronic obstructive pulmonary disease) (HCC)   . Crohn's disease [facility])      Social History   Social History  . Marital Status: Married    Spouse Name: N/A  . Number of Children: N/A  . Years of Education: N/A   Occupational History  . Not on file.   Social History Main Topics  . Smoking status: Never Smoker   . Smokeless tobacco: Never Used  . Alcohol Use: No  . Drug Use: No  . Sexual Activity: Not on file   Other Topics Concern  . Not on file   Social History Narrative    Past Surgical History  Procedure Laterality Date  . Back surgery    . Colon surgery    . Shoulder surgery      No family history on file.  No Known Allergies  Current Outpatient Prescriptions on File Prior to Visit  Medication Sig Dispense Refill  . albuterol (PROVENTIL HFA;VENTOLIN HFA) 108 (90 BASE) MCG/ACT inhaler Inhale 2 puffs into the lungs every 6 (six) hours as needed for wheezing or shortness of breath.     . diclofenac (VOLTAREN) 75 MG EC tablet Take 75 mg by mouth 2 (two)  times daily.    Marland Kitchen dicyclomine (BENTYL) 10 MG capsule Take 10 mg by mouth 3 (three) times daily before meals. As Needed    . flunisolide (NASALIDE) 0.025 % SOLN Inhale 2 sprays into the lungs 2 (two) times daily.    . fluticasone (FLONASE) 50 MCG/ACT nasal spray Place 1 spray into both nostrils as needed. 2 sprays each nostril once a day    . formoterol (PERFOROMIST) 20 MCG/2ML nebulizer solution Take 20 mcg by nebulization 4 (four) times daily.    Marland Kitchen HYDROcodone-acetaminophen (NORCO/VICODIN) 5-325 MG per tablet Take 1 tablet by mouth 2 (two) times daily as needed for pain.    . InFLIXimab (REMICADE IV) Inject into the vein. Every 5 weeks    . loratadine (CLARITIN) 10 MG tablet Take 10 mg by mouth daily.    . mometasone (ASMANEX 30 METERED DOSES) 220 MCG/INH inhaler Inhale 2 puffs into the lungs at bedtime.    . sildenafil (VIAGRA) 50 MG tablet Take 50 mg by mouth as needed for erectile dysfunction.    . simvastatin (ZOCOR) 80 MG tablet Take 80 mg by mouth at bedtime.    . tamsulosin (FLOMAX) 0.4 MG CAPS Take 0.4 mg by mouth at bedtime.     No  current facility-administered medications on file prior to visit.    BP 160/100 mmHg  Pulse 74  Temp(Src) 98.4 F (36.9 C) (Oral)  Resp 20  Ht 5' 8.75" (1.746 m)  Wt 227 lb (102.967 kg)  BMI 33.78 kg/m2  SpO2 98%     Review of Systems  Constitutional: Negative for fever, chills, appetite change and fatigue.  HENT: Negative for congestion, dental problem, ear pain, hearing loss, sore throat, tinnitus, trouble swallowing and voice change.   Eyes: Negative for pain, discharge and visual disturbance.  Respiratory: Positive for shortness of breath and wheezing. Negative for cough, chest tightness and stridor.   Cardiovascular: Negative for chest pain, palpitations and leg swelling.  Gastrointestinal: Negative for nausea, vomiting, abdominal pain, diarrhea, constipation, blood in stool and abdominal distention.  Genitourinary: Negative for urgency,  hematuria, flank pain, discharge, difficulty urinating and genital sores.  Musculoskeletal: Negative for myalgias, back pain, joint swelling, arthralgias, gait problem and neck stiffness.  Skin: Negative for rash.  Neurological: Negative for dizziness, syncope, speech difficulty, weakness, numbness and headaches.  Hematological: Negative for adenopathy. Does not bruise/bleed easily.  Psychiatric/Behavioral: Negative for behavioral problems and dysphoric mood. The patient is not nervous/anxious.        Objective:   Physical Exam  Constitutional: He is oriented to person, place, and time. He appears well-developed.  HENT:  Head: Normocephalic.  Right Ear: External ear normal.  Left Ear: External ear normal.  Eyes: Conjunctivae and EOM are normal.  Neck: Normal range of motion.  Cardiovascular: Normal rate and normal heart sounds.   Pulmonary/Chest: Effort normal and breath sounds normal. No respiratory distress. He has no wheezes. He has no rales.  O2 saturation 98  Abdominal: Bowel sounds are normal.  Musculoskeletal: Normal range of motion. He exhibits no edema or tenderness.  Neurological: He is alert and oriented to person, place, and time.  Psychiatric: He has a normal mood and affect. His behavior is normal.          Assessment & Plan:   Status post COPD exacerbation.    Clinically stable Crohn's disease, stable.  Follow-up GI Dyslipidemia.  Continue statin therapy  No change in medical regimen.  Will continue present maintenance regimen Return as scheduled for follow-up later this summer for his annual exam

## 2015-10-09 ENCOUNTER — Encounter: Payer: Self-pay | Admitting: Internal Medicine

## 2015-10-09 ENCOUNTER — Ambulatory Visit (INDEPENDENT_AMBULATORY_CARE_PROVIDER_SITE_OTHER): Payer: Medicare Other | Admitting: Internal Medicine

## 2015-10-09 VITALS — BP 140/90 | HR 87 | Temp 98.6°F | Resp 20 | Ht 68.25 in | Wt 223.0 lb

## 2015-10-09 DIAGNOSIS — K5000 Crohn's disease of small intestine without complications: Secondary | ICD-10-CM | POA: Diagnosis not present

## 2015-10-09 DIAGNOSIS — E7800 Pure hypercholesterolemia, unspecified: Secondary | ICD-10-CM | POA: Diagnosis not present

## 2015-10-09 DIAGNOSIS — Z86010 Personal history of colonic polyps: Secondary | ICD-10-CM

## 2015-10-09 DIAGNOSIS — Z0000 Encounter for general adult medical examination without abnormal findings: Secondary | ICD-10-CM | POA: Diagnosis not present

## 2015-10-09 LAB — LIPID PANEL
CHOL/HDL RATIO: 5
CHOLESTEROL: 282 mg/dL — AB (ref 0–200)
HDL: 56.9 mg/dL (ref >39.00)
LDL Cholesterol: 189 mg/dL — ABNORMAL HIGH (ref 0–99)
NONHDL: 225.41
Triglycerides: 180 mg/dL — ABNORMAL HIGH (ref 0.0–149.0)
VLDL: 36 mg/dL (ref 0.0–40.0)

## 2015-10-09 MED ORDER — SILDENAFIL CITRATE 50 MG PO TABS: 50.0000 mg | ORAL_TABLET | ORAL | Status: AC | PRN

## 2015-10-09 NOTE — Patient Instructions (Signed)
Limit your sodium (Salt) intake  Please check your blood pressure on a regular basis.  If it is consistently greater than 150/90, please make an office appointment.    It is important that you exercise regularly, at least 20 minutes 3 to 4 times per week.  If you develop chest pain or shortness of breath seek  medical attention.  You need to lose weight.  Consider a lower calorie diet and regular exercise.  Follow-up pulmonary and GI medicine

## 2015-10-09 NOTE — Progress Notes (Signed)
Pre visit review using our clinic review tool, if applicable. No additional management support is needed unless otherwise documented below in the visit note. 

## 2015-10-09 NOTE — Progress Notes (Signed)
Subjective:    Patient ID: Kevin Yang, male    DOB: 1950/09/19, 65 y.o.   MRN: [id number]  HPI  65 year old patient who is seen today for a preventive health examination. Chronic medical problems include Crohn's disease as well as COPD.  [facility]  GI and pulmonary medicine.  He is hospitalized in late March in [place] for exacerbation of COPD.  Today he is doing well.  Colonoscopy April 2017  Past Medical History  Diagnosis Date  . Asthma   . COPD (chronic obstructive pulmonary disease) (HCC)   . Crohn's disease [facility])      Social History   Social History  . Marital Status: Married    Spouse Name: N/A  . Number of Children: N/A  . Years of Education: N/A   Occupational History  . Not on file.   Social History Main Topics  . Smoking status: Never Smoker   . Smokeless tobacco: Never Used  . Alcohol Use: No  . Drug Use: No  . Sexual Activity: Not on file   Other Topics Concern  . Not on file   Social History Narrative    Past Surgical History  Procedure Laterality Date  . Back surgery    . Colon surgery    . Shoulder surgery      No family history on file.  Allergies  Allergen Reactions  . Penicillins Diarrhea    Had a reaction last month to PCN for a tooth, developed diarrhea and then was given metronidazole by his primary MD    Current Outpatient Prescriptions on File Prior to Visit  Medication Sig Dispense Refill  . albuterol (PROVENTIL HFA;VENTOLIN HFA) 108 (90 BASE) MCG/ACT inhaler Inhale 2 puffs into the lungs every 6 (six) hours as needed for wheezing or shortness of breath.     . diclofenac (VOLTAREN) 75 MG EC tablet Take 75 mg by mouth 2 (two) times daily.    Marland Kitchen dicyclomine (BENTYL) 10 MG capsule Take 10 mg by mouth 3 (three) times daily before meals. As Needed    . flunisolide (NASALIDE) 0.025 % SOLN Inhale 2 sprays into the lungs 2 (two) times daily.    . fluticasone (FLONASE) 50 MCG/ACT nasal spray Place 1  spray into both nostrils as needed. 2 sprays each nostril once a day    . formoterol (PERFOROMIST) 20 MCG/2ML nebulizer solution Take 20 mcg by nebulization 4 (four) times daily.    Marland Kitchen HYDROcodone-acetaminophen (NORCO/VICODIN) 5-325 MG per tablet Take 1 tablet by mouth 2 (two) times daily as needed for pain.    . InFLIXimab (REMICADE IV) Inject into the vein. Every 5 weeks    . loratadine (CLARITIN) 10 MG tablet Take 10 mg by mouth daily.    . mometasone (ASMANEX 30 METERED DOSES) 220 MCG/INH inhaler Inhale 2 puffs into the lungs at bedtime.    . simvastatin (ZOCOR) 80 MG tablet Take 80 mg by mouth at bedtime.    . tamsulosin (FLOMAX) 0.4 MG CAPS Take 0.4 mg by mouth at bedtime.     No current facility-administered medications on file prior to visit.    BP 140/90 mmHg  Pulse 87  Temp(Src) 98.6 F (37 C) (Oral)  Resp 20  Ht 5' 8.25" (1.734 m)  Wt 223 lb (101.152 kg)  BMI 33.64 kg/m2  SpO2 7%   Medicare wellness visit  1. Risk factors, based on past  M,S,F history.  Cardiovascular risk factors include a history  of dyslipidemia.  2.  Physical activities: Has been disabled since 2008 due to Crohn's disease and asthma.  Does light activity such as light yard work  3.  Depression/mood: No history of major depression or mood disorder  4.  Hearing: No deficits  5.  ADL's: Independent  6.  Fall risk: Low  7.  Home safety: No problems identified  8.  Height weight, and visual acuity; height and weight stable no change in visual acuity  9.  Counseling: Heart healthy diet more regular exercise and modest weight loss all recommended  10. Lab orders based on risk factors: He is followed the [place] with frequent blood draws.  Will check lipid profile  11. Referral : Follow-up GI and pulmonary medicine  12. Care plan: Continue aggressive risk factor modification with efforts at weight loss  13. Cognitive assessment: Alert in order with normal affect no cognitive dysfunction  14.  Screening: Patient provided with a written and personalized 5-10 year screening schedule in the [facility].  .  We'll continue annual clinical exams with screening lab.  Will require colonoscopies at five-year intervals  15. Provider List Update: Primary care GI, pulmonary medicine   Review of Systems  Constitutional: Negative for fever, chills, appetite change and fatigue.  HENT: Negative for congestion, dental problem, ear pain, hearing loss, sore throat, tinnitus, trouble swallowing and voice change.   Eyes: Negative for pain, discharge and visual disturbance.  Respiratory: Negative for cough, chest tightness, wheezing and stridor.   Cardiovascular: Negative for chest pain, palpitations and leg swelling.  Gastrointestinal: Negative for nausea, vomiting, abdominal pain, diarrhea, constipation, blood in stool and abdominal distention.  Genitourinary: Negative for urgency, hematuria, flank pain, discharge, difficulty urinating and genital sores.  Musculoskeletal: Negative for myalgias, back pain, joint swelling, arthralgias, gait problem and neck stiffness.  Skin: Negative for rash.  Neurological: Negative for dizziness, syncope, speech difficulty, weakness, numbness and headaches.  Hematological: Negative for adenopathy. Does not bruise/bleed easily.  Psychiatric/Behavioral: Negative for behavioral problems and dysphoric mood. The patient is not nervous/anxious.        Objective:   Physical Exam  Constitutional: He appears well-developed and well-nourished.  Overweight.  Blood pressure 140/86   HENT:  Head: Normocephalic and atraumatic.  Right Ear: External ear normal.  Left Ear: External ear normal.  Nose: Nose normal.  Mouth/Throat: Oropharynx is clear and moist.  Pharyngeal crowding  Eyes: Conjunctivae and EOM are normal. Pupils are equal, round, and reactive to light. No scleral icterus.  Neck: Normal range of motion. Neck supple. No JVD present. No thyromegaly present.    Cardiovascular: Regular rhythm, normal heart sounds and intact distal pulses.  Exam reveals no gallop and no friction rub.   No murmur heard. Pulmonary/Chest: Effort normal and breath sounds normal. He exhibits no tenderness.  Abdominal: Soft. Bowel sounds are normal. He exhibits no distension and no mass. There is no tenderness.  Lower midline scar  Genitourinary: Prostate normal and penis normal.  Musculoskeletal: Normal range of motion. He exhibits no edema or tenderness.  Left bunion  Lymphadenopathy:    He has no cervical adenopathy.  Neurological: He is alert. He has normal reflexes. No cranial nerve deficit. Coordination normal.  Skin: Skin is warm and dry. No rash noted.  Psychiatric: He has a normal mood and affect. His behavior is normal.          Assessment & Plan:   Preventive health exam Crohn's disease, stable.  GI follow-up Dyslipidemia.  Continue statin therapy  Chronic asthma, controlled.  Continue maintenance regimen.  Pulmonary follow-up History of colonic polyps  Recheck in one year or as needed

## 2015-10-10 LAB — HIV ANTIBODY (ROUTINE TESTING W REFLEX): HIV: NONREACTIVE

## 2015-10-10 LAB — HEPATITIS C ANTIBODY: HCV AB: NEGATIVE

## 2017-08-31 ENCOUNTER — Telehealth (HOSPITAL_COMMUNITY): Payer: Self-pay

## 2017-08-31 NOTE — Telephone Encounter (Signed)
Called and spoke with patient in regards to Pulmonary Rehab - Patient stated he is interested. He would like for me to give him a call back after 1200 as he is at a dr appt.

## 2017-09-01 ENCOUNTER — Telehealth (HOSPITAL_COMMUNITY): Payer: Self-pay

## 2017-09-01 NOTE — Telephone Encounter (Signed)
Called and scheduled orientation on 10/08/2017 at 9:30am. Patient will attend the 10:30am exc class. Mailed packet.

## 2017-10-08 ENCOUNTER — Encounter (HOSPITAL_COMMUNITY)
Admission: RE | Admit: 2017-10-08 | Discharge: 2017-10-08 | Disposition: A | Discharge status: Home / Self Care | Payer: No Typology Code available for payment source | Source: Ambulatory Visit | Attending: Pulmonary Disease | Admitting: Pulmonary Disease

## 2017-10-08 ENCOUNTER — Encounter (HOSPITAL_COMMUNITY): Payer: Self-pay

## 2017-10-08 VITALS — BP 135/78 | HR 84 | Resp 18 | Ht 69.0 in | Wt 233.0 lb

## 2017-10-08 DIAGNOSIS — J449 Chronic obstructive pulmonary disease, unspecified: Secondary | ICD-10-CM | POA: Insufficient documentation

## 2017-10-08 NOTE — Progress Notes (Signed)
Kevin Yang 67 y.o. male Pulmonary Rehab Orientation Note Patient arrived today in [facility]hab for orientation to Pulmonary Rehab. He was transported from [facility] via wheel chair. He does not carry portable oxygen and has not been prescribed oxygen for home use. He does use his CPAP for OSA approximately 60% of the time according to patient. Color good, skin warm and dry. Patient is oriented to time and place. Patient's medical history, psychosocial health, and medications reviewed. Psychosocial assessment reveals pt lives with their spouse. She is physically disabled and he is her primary caregiver. Pt is currently retired, disabled. He worked for the [place] [employer] for many years. Pt hobbies include fishing. Pt reports his stress level is moderate however he relies on his faith in God to see him through. Areas of stress/anxiety include the health of his wife and the stress of being a caregiver. He does state she is doing much better since her back surgery and she is able to do more for herself than she previously was.  Pt does not exhibit signs of depression. PHQ2/9 score 0/na. Pt shows good  coping skills with positive outlook. He was offered emotional support and reassurance. Will continue to monitor and evaluate progress toward psychosocial goal(s) of expressing need for self care and caregiver strain. Physical assessment reveals heart rate is normal, breath sounds clear to auscultation, no wheezes, rales, or rhonchi. Grip strength equal, strong. Distal pulses palpable. Patient reports he does take medications as prescribed. Patient states he follows a Regular diet. The patient has been trying to loose weight by walking on the treadmill 30 min daily. Patient's weight will be monitored closely. Demonstration and practice of PLB using pulse oximeter. Patient able to return demonstration satisfactorily. Safety and hand hygiene in the exercise area reviewed with patient. Patient voices  understanding of the information reviewed. Department expectations discussed with patient and achievable goals were set. The patient shows enthusiasm about attending the program and we look forward to working with this nice gentleman. The patient is scheduled for a 6 min walk test on 10/12/17 and to begin exercise on 10/19/17 at 1030.   45 minutes was spent on a variety of activities such as assessment of the patient, obtaining baseline data including height, weight, BMI, and grip strength, verifying medical history, allergies, and current medications, and teaching patient strategies for performing tasks with less respiratory effort with emphasis on pursed lip breathing.

## 2017-10-12 ENCOUNTER — Encounter (HOSPITAL_COMMUNITY)
Admission: RE | Admit: 2017-10-12 | Discharge: 2017-10-12 | Disposition: A | Discharge status: Home / Self Care | Payer: No Typology Code available for payment source | Source: Ambulatory Visit | Attending: Pulmonary Disease | Admitting: Pulmonary Disease

## 2017-10-12 DIAGNOSIS — J449 Chronic obstructive pulmonary disease, unspecified: Secondary | ICD-10-CM | POA: Diagnosis present

## 2017-10-12 NOTE — Progress Notes (Signed)
Pulmonary Individual Treatment Plan  Patient Details  Name: Kevin Yang MRN: [id number] Date of Birth: 10-28-1950 Referring Provider:     Pulmonary Rehab Walk Test from 10/12/2017 in [facility] CARDIAC REHAB  Referring Provider  Dr. Molli Knock      Initial Encounter Date:    Pulmonary Rehab Walk Test from 10/12/2017 in [facility] CARDIAC REHAB  Date  10/12/17  Referring Provider  Dr. Molli Knock      Visit Diagnosis: Chronic obstructive pulmonary disease, unspecified COPD type (HCC)  Patient's Home Medications on Admission:   Current Outpatient Medications:  .  albuterol (PROVENTIL HFA;VENTOLIN HFA) 108 (90 BASE) MCG/ACT inhaler, Inhale 2 puffs into the lungs every 6 (six) hours as needed for wheezing or shortness of breath. , Disp: , Rfl:  .  budesonide-formoterol (SYMBICORT) 160-4.5 MCG/ACT inhaler, Inhale 2 puffs into the lungs 2 (two) times daily., Disp: , Rfl:  .  cetirizine (ZYRTEC) 10 MG tablet, Take 10 mg by mouth daily., Disp: , Rfl:  .  diclofenac (VOLTAREN) 75 MG EC tablet, Take 75 mg by mouth 2 (two) times daily., Disp: , Rfl:  .  fluticasone (FLONASE) 50 MCG/ACT nasal spray, Place 1 spray into both nostrils as needed. 2 sprays each nostril once a day, Disp: , Rfl:  .  formoterol (PERFOROMIST) 20 MCG/2ML nebulizer solution, Take 20 mcg by nebulization 4 (four) times daily., Disp: , Rfl:  .  HYDROcodone-acetaminophen (NORCO) 10-325 MG tablet, Take 1 tablet by mouth 2 (two) times daily as needed for severe pain., Disp: , Rfl:  .  inFLIXimab-abda 100 MG SOLR, Inject into the vein., Disp: , Rfl:  .  ipratropium-albuterol (DUONEB) 0.5-2.5 (3) MG/3ML SOLN, Inhale into the lungs., Disp: , Rfl:  .  methylPREDNISolone acetate (DEPO-MEDROL) 40 MG/ML injection, 40 mg once., Disp: , Rfl:  .  sildenafil (VIAGRA) 50 MG tablet, Take 1 tablet (50 mg total) by mouth as needed for erectile dysfunction., Disp: 10 tablet, Rfl: 2 .  simvastatin (ZOCOR) 80 MG  tablet, Take 80 mg by mouth at bedtime., Disp: , Rfl:  .  tamsulosin (FLOMAX) 0.4 MG CAPS, Take 0.4 mg by mouth at bedtime., Disp: , Rfl:   Past Medical History: Past Medical History:  Diagnosis Date  . Asthma   . COPD (chronic obstructive pulmonary disease) (HCC)   . Crohn's disease (HCC)     Tobacco Use: Social History   Tobacco Use  Smoking Status Former Smoker  . Types: Cigars  . Last attempt to quit: 09/07/1986  . Years since quitting: 31.1  Smokeless Tobacco Never Used    Labs: Recent Hydrographic surveyor    Labs for ITP Cardiac and Pulmonary Rehab Latest Ref Rng & Units 04/22/2007 04/07/2009 04/08/2009 10/09/2015   Cholestrol 0 - 200 mg/dL 409(WJ) - 191 ATP III CLASSIFICATION: <200     mg/dL   Desirable 478-295  mg/dL   Borderline High >=621    mg/dL   High(H) 308(M)   LDLCALC 0 - 99 mg/dL - - 578 Total Cholesterol/HDL:CHD Risk Coronary Heart Disease Risk Table Men   Women 1/2 Average Risk   3.4   3.3 Average Risk       5.0   4.4 2 X Average Risk   9.6   7.1 3 X Average Risk  23.4   11.0 Use the calculated Patient Ratio above and the CHD Risk Table to determine the patient's CHD Risk. ATP III CLASSIFICATION (LDL): <100     mg/dL  Optimal 100-129  mg/dL   Near or Above Optimal 130-159  mg/dL   Borderline 161-096  mg/dL   High >045     mg/dL   Very High(H) 409(W)   LDLDIRECT mg/dL 119.1 - - -   HDL >47.82 mg/dL 37.3(L) - 40 56.90   Trlycerides 0.0 - 149.0 mg/dL 956 - 213 086.0(H)   TCO2 0 - 100 mmol/L - 30 - -      Capillary Blood Glucose: No results found for: GLUCAP   Pulmonary Assessment Scores: Pulmonary Assessment Scores    Row Name 10/12/17 1646         ADL UCSD   ADL Phase  Entry       mMRC Score   mMRC Score  1        Pulmonary Function Assessment: Pulmonary Function Assessment - 10/08/17 1049      Breath   Bilateral Breath Sounds  Clear;Decreased    Shortness of Breath  Yes;Limiting activity       Exercise Target  Goals: Date: 10/12/17  Exercise Program Goal: Individual exercise prescription set using results from initial 6 min walk test and THRR while considering  patient's activity barriers and safety.    Exercise Prescription Goal: Initial exercise prescription builds to 30-45 minutes a day of aerobic activity, 2-3 days per week.  Home exercise guidelines will be given to patient during program as part of exercise prescription that the participant will acknowledge.  Activity Barriers & Risk Stratification: Activity Barriers & Cardiac Risk Stratification - 10/08/17 1023      Activity Barriers & Cardiac Risk Stratification   Activity Barriers  Back Problems;Shortness of Breath;Deconditioning       6 Minute Walk: 6 Minute Walk    Row Name 10/12/17 1641 10/12/17 1646       6 Minute Walk   Phase  Initial  Initial    Distance  1600 feet  -    Walk Time  6 minutes  -    # of Rest Breaks  0  -    MPH  3.03  -    METS  3.3  -    RPE  15  -    Perceived Dyspnea   2  -    Symptoms  No  -    Resting HR  96 bpm  -    Resting BP  138/84  -    Resting Oxygen Saturation   95 %  -    Exercise Oxygen Saturation  during 6 min walk  88 %  -    Max Ex. HR  116 bpm  -    Max Ex. BP  114/84  -      Interval HR   1 Minute HR  107  -    2 Minute HR  108  -    3 Minute HR  107  -    4 Minute HR  104  -    5 Minute HR  114  -    6 Minute HR  116  -    2 Minute Post HR  101  -    Interval Heart Rate?  Yes  -      Interval Oxygen   Interval Oxygen?  Yes  -    Baseline Oxygen Saturation %  95 %  -    1 Minute Oxygen Saturation %  94 %  -    1 Minute Liters of Oxygen  0 L  -  2 Minute Oxygen Saturation %  88 %  -    2 Minute Liters of Oxygen  0 L  -    3 Minute Oxygen Saturation %  89 %  -    3 Minute Liters of Oxygen  0 L  -    4 Minute Oxygen Saturation %  89 %  -    4 Minute Liters of Oxygen  0 L  -    5 Minute Oxygen Saturation %  89 %  -    5 Minute Liters of Oxygen  0 L  -    6  Minute Oxygen Saturation %  91 %  -    6 Minute Liters of Oxygen  0 L  -    2 Minute Post Oxygen Saturation %  96 %  -    2 Minute Post Liters of Oxygen  0 L  -       Oxygen Initial Assessment: Oxygen Initial Assessment - 10/12/17 1646      Initial 6 min Walk   Oxygen Used  None      Program Oxygen Prescription   Program Oxygen Prescription  None       Oxygen Re-Evaluation:   Oxygen Discharge (Final Oxygen Re-Evaluation):   Initial Exercise Prescription: Initial Exercise Prescription - 10/12/17 1600      Date of Initial Exercise RX and Referring Provider   Date  10/12/17    Referring Provider  Dr. Molli Knock      Bike   Level  1    Minutes  17      NuStep   Level  2    SPM  80    Minutes  17    METs  1.5      Track   Laps  10    Minutes  17      Prescription Details   Frequency (times per week)  2    Duration  Progress to 45 minutes of aerobic exercise without signs/symptoms of physical distress      Intensity   THRR 40-80% of Max Heartrate  52-124    Ratings of Perceived Exertion  11-13    Perceived Dyspnea  0-4      Progression   Progression  Continue progressive overload as per policy without signs/symptoms or physical distress.      Resistance Training   Training Prescription  Yes    Weight  blue bands    Reps  10-15       Perform Capillary Blood Glucose checks as needed.  Exercise Prescription Changes:   Exercise Comments:   Exercise Goals and Review: Exercise Goals    Row Name 10/08/17 1024             Exercise Goals   Increase Physical Activity  Yes       Intervention  Provide advice, education, support and counseling about physical activity/exercise needs.;Develop an individualized exercise prescription for aerobic and resistive training based on initial evaluation findings, risk stratification, comorbidities and participant's personal goals.       Expected Outcomes  Short Term: Attend rehab on a regular basis to increase amount  of physical activity.;Long Term: Add in home exercise to make exercise part of routine and to increase amount of physical activity.;Long Term: Exercising regularly at least 3-5 days a week.       Increase Strength and Stamina  Yes       Intervention  Provide advice, education, support and counseling about  physical activity/exercise needs.;Develop an individualized exercise prescription for aerobic and resistive training based on initial evaluation findings, risk stratification, comorbidities and participant's personal goals.       Expected Outcomes  Short Term: Increase workloads from initial exercise prescription for resistance, speed, and METs.;Short Term: Perform resistance training exercises routinely during rehab and add in resistance training at home;Long Term: Improve cardiorespiratory fitness, muscular endurance and strength as measured by increased METs and functional capacity ( )       Able to understand and use rate of perceived exertion (RPE) scale  Yes       Intervention  Provide education and explanation on how to use RPE scale       Expected Outcomes  Short Term: Able to use RPE daily in rehab to express subjective intensity level;Long Term:  Able to use RPE to guide intensity level when exercising independently       Able to understand and use Dyspnea scale  Yes       Intervention  Provide education and explanation on how to use Dyspnea scale       Expected Outcomes  Short Term: Able to use Dyspnea scale daily in rehab to express subjective sense of shortness of breath during exertion;Long Term: Able to use Dyspnea scale to guide intensity level when exercising independently       Knowledge and understanding of Target Heart Rate Range (THRR)  Yes       Intervention  Provide education and explanation of THRR including how the numbers were predicted and where they are located for reference       Expected Outcomes  Short Term: Able to state/look up THRR;Short Term: Able to use daily as  guideline for intensity in rehab;Long Term: Able to use THRR to govern intensity when exercising independently       Understanding of Exercise Prescription  Yes       Intervention  Provide education, explanation, and written materials on patient's individual exercise prescription       Expected Outcomes  Short Term: Able to explain program exercise prescription;Long Term: Able to explain home exercise prescription to exercise independently          Exercise Goals Re-Evaluation :   Discharge Exercise Prescription (Final Exercise Prescription Changes):   Nutrition:  Target Goals: Understanding of nutrition guidelines, daily intake of sodium 1500mg , cholesterol 200mg , calories 30% from fat and 7% or less from saturated fats, daily to have 5 or more servings of fruits and vegetables.  Biometrics: Pre Biometrics - 10/08/17 1058      Pre Biometrics   Grip Strength  31 kg        Nutrition Therapy Plan and Nutrition Goals:   Nutrition Assessments:   Nutrition Goals Re-Evaluation:   Nutrition Goals Discharge (Final Nutrition Goals Re-Evaluation):   Psychosocial: Target Goals: Acknowledge presence or absence of significant depression and/or stress, maximize coping skills, provide positive support system. Participant is able to verbalize types and ability to use techniques and skills needed for reducing stress and depression.  Initial Review & Psychosocial Screening: Initial Psych Review & Screening - 10/08/17 1050      Initial Review   Current issues with  Current Stress Concerns lovingly cares for his wife with chronic illness.     Source of Stress Concerns  Family    Comments  intermittent mild caregiver stress however patients sister-in-law willingly steps in to care for patient's wife to allow patient to have some "me time"  Family Dynamics   Good Support System?  Yes church family and friends      Barriers   Psychosocial barriers to participate in program  The  patient should benefit from training in stress management and relaxation.      Screening Interventions   Interventions  Encouraged to exercise;Provide feedback about the scores to participant    Expected Outcomes  Short Term goal: Utilizing psychosocial counselor, staff and physician to assist with identification of specific Stressors or current issues interfering with healing process. Setting desired goal for each stressor or current issue identified.;Long Term Goal: Stressors or current issues are controlled or eliminated.       Quality of Life Scores:  Scores of 19 and below usually indicate a poorer quality of life in these areas.  A difference of  2-3 points is a clinically meaningful difference.  A difference of 2-3 points in the total score of the Quality of Life Index has been associated with significant improvement in overall quality of life, self-image, physical symptoms, and general health in studies assessing change in quality of life.   PHQ-9: Recent Review Flowsheet Data    Depression screen [facility] 2/9 10/08/2017 10/09/2015 07/05/2014   Decreased Interest 0 0 0   Down, Depressed, Hopeless 0 0 0   PHQ - 2 Score 0 0 0     Interpretation of Total Score  Total Score Depression Severity:  1-4 = Minimal depression, 5-9 = Mild depression, 10-14 = Moderate depression, 15-19 = Moderately severe depression, 20-27 = Severe depression   Psychosocial Evaluation and Intervention: Psychosocial Evaluation - 10/08/17 1056      Psychosocial Evaluation & Interventions   Interventions  Encouraged to exercise with the program and follow exercise prescription    Expected Outcomes  patient will remain free from psychosocial barriers to participation in pulmonary rehab    Continue Psychosocial Services   Follow up required by staff       Psychosocial Re-Evaluation:   Psychosocial Discharge (Final Psychosocial Re-Evaluation):   Education: Education Goals: Education classes will be provided on  a weekly basis, covering required topics. Participant will state understanding/return demonstration of topics presented.  Learning Barriers/Preferences: Learning Barriers/Preferences - 10/08/17 1049      Learning Barriers/Preferences   Learning Barriers  None    Learning Preferences  Skilled Demonstration;Written Material       Education Topics: Risk Factor Reduction:  -Group instruction that is supported by a PowerPoint presentation. Instructor discusses the definition of a risk factor, different risk factors for pulmonary disease, and how the heart and lungs work together.     Nutrition for Pulmonary Patient:  -Group instruction provided by PowerPoint slides, verbal discussion, and written materials to support subject matter. The instructor gives an explanation and review of healthy diet recommendations, which includes a discussion on weight management, recommendations for fruit and vegetable consumption, as well as protein, fluid, caffeine, fiber, sodium, sugar, and alcohol. Tips for eating when patients are short of breath are discussed.   Pursed Lip Breathing:  -Group instruction that is supported by demonstration and informational handouts. Instructor discusses the benefits of pursed lip and diaphragmatic breathing and detailed demonstration on how to preform both.     Oxygen Safety:  -Group instruction provided by PowerPoint, verbal discussion, and written material to support subject matter. There is an overview of "What is Oxygen" and "Why do we need it".  Instructor also reviews how to create a safe environment for oxygen use, the importance of using oxygen  as prescribed, and the risks of noncompliance. There is a brief discussion on traveling with oxygen and resources the patient may utilize.   Oxygen Equipment:  -Group instruction provided by [facility] Staff utilizing handouts, written materials, and equipment demonstrations.   Signs and Symptoms:  -Group instruction  provided by written material and verbal discussion to support subject matter. Warning signs and symptoms of infection, stroke, and heart attack are reviewed and when to call the physician/911 reinforced. Tips for preventing the spread of infection discussed.   Advanced Directives:  -Group instruction provided by verbal instruction and written material to support subject matter. Instructor reviews Advanced Directive laws and proper instruction for filling out document.   Pulmonary Video:  -Group video education that reviews the importance of medication and oxygen compliance, exercise, good nutrition, pulmonary hygiene, and pursed lip and diaphragmatic breathing for the pulmonary patient.   Exercise for the Pulmonary Patient:  -Group instruction that is supported by a PowerPoint presentation. Instructor discusses benefits of exercise, core components of exercise, frequency, duration, and intensity of an exercise routine, importance of utilizing pulse oximetry during exercise, safety while exercising, and options of places to exercise outside of rehab.     Pulmonary Medications:  -Verbally interactive group education provided by instructor with focus on inhaled medications and proper administration.   Anatomy and Physiology of the Respiratory System and Intimacy:  -Group instruction provided by PowerPoint, verbal discussion, and written material to support subject matter. Instructor reviews respiratory cycle and anatomical components of the respiratory system and their functions. Instructor also reviews differences in obstructive and restrictive respiratory diseases with examples of each. Intimacy, Sex, and Sexuality differences are reviewed with a discussion on how relationships can change when diagnosed with pulmonary disease. Common sexual concerns are reviewed.   MD DAY -A group question and answer session with a medical doctor that allows participants to ask questions that relate to their  pulmonary disease state.   OTHER EDUCATION -Group or individual verbal, written, or video instructions that support the educational goals of the pulmonary rehab program.   Holiday Eating Survival Tips:  -Group instruction provided by PowerPoint slides, verbal discussion, and written materials to support subject matter. The instructor gives patients tips, tricks, and techniques to help them not only survive but enjoy the holidays despite the onslaught of food that accompanies the holidays.   Knowledge Questionnaire Score: Knowledge Questionnaire Score - 10/08/17 1049      Knowledge Questionnaire Score   Pre Score  9/18       Core Components/Risk Factors/Patient Goals at Admission: Personal Goals and Risk Factors at Admission - 10/08/17 1050      Core Components/Risk Factors/Patient Goals on Admission    Weight Management  Yes    Intervention  Weight Management/Obesity: Establish reasonable short term and long term weight goals.;Obesity: Provide education and appropriate resources to help participant work on and attain dietary goals.;Weight Management: Provide education and appropriate resources to help participant work on and attain dietary goals.;Weight Management: Develop a combined nutrition and exercise program designed to reach desired caloric intake, while maintaining appropriate intake of nutrient and fiber, sodium and fats, and appropriate energy expenditure required for the weight goal.    Expected Outcomes  Short Term: Continue to assess and modify interventions until short term weight is achieved;Long Term: Adherence to nutrition and physical activity/exercise program aimed toward attainment of established weight goal;Weight Maintenance: Understanding of the daily nutrition guidelines, which includes 25-35% calories from fat, 7% or less cal from  saturated fats, less than 200mg  cholesterol, less than 1.5gm of sodium, & 5 or more servings of fruits and vegetables daily;Weight Loss:  Understanding of general recommendations for a balanced deficit meal plan, which promotes 1-2 lb weight loss per week and includes a negative energy balance of [phone] kcal/d;Understanding recommendations for meals to include 15-35% energy as protein, 25-35% energy from fat, 35-60% energy from carbohydrates, less than 200mg  of dietary cholesterol, 20-35 gm of total fiber daily;Understanding of distribution of calorie intake throughout the day with the consumption of 4-5 meals/snacks    Improve shortness of breath with ADL's  Yes    Intervention  Provide education, individualized exercise plan and daily activity instruction to help decrease symptoms of SOB with activities of daily living.    Expected Outcomes  Short Term: Improve cardiorespiratory fitness to achieve a reduction of symptoms when performing ADLs;Long Term: Be able to perform more ADLs without symptoms or delay the onset of symptoms       Core Components/Risk Factors/Patient Goals Review:    Core Components/Risk Factors/Patient Goals at Discharge (Final Review):    ITP Comments:   Comments:

## 2017-10-14 ENCOUNTER — Encounter (HOSPITAL_COMMUNITY): Payer: Self-pay | Admitting: *Deleted

## 2017-10-19 ENCOUNTER — Encounter (HOSPITAL_COMMUNITY)
Admission: RE | Admit: 2017-10-19 | Discharge: 2017-10-19 | Disposition: A | Discharge status: Home / Self Care | Payer: No Typology Code available for payment source | Source: Ambulatory Visit | Attending: Internal Medicine | Admitting: Internal Medicine

## 2017-10-19 DIAGNOSIS — J449 Chronic obstructive pulmonary disease, unspecified: Secondary | ICD-10-CM | POA: Diagnosis not present

## 2017-10-19 NOTE — Progress Notes (Signed)
Daily Session Note  Patient Details  Name: Kevin Yang MRN: [id number] Date of Birth: 07/27/1950 Referring Provider:     Pulmonary Rehab Walk Test from 10/12/2017 in [place]  Referring Provider  Dr. Nelda Marseille      Encounter Date: 10/19/2017  Check In: Session Check In - 10/19/17 1156      Check-In   Location  [facility]    Staff Present  Cloyde Reams DiVincenzo, MS, ACSM RCEP, Exercise Physiologist;Jackline Castilla Ysidro Evert, RN;Carlette Wilber Oliphant, RN, BSN    Supervising physician immediately available to respond to emergencies  [facility] immediately available    Physician(s)  Dr. Sarajane Jews    Medication changes reported      No    Fall or balance concerns reported     No    Tobacco Cessation  No Change    Warm-up and Cool-down  Performed as group-led instruction    Resistance Training Performed  Yes    VAD Patient?  No      Pain Assessment   Currently in Pain?  No/denies       Capillary Blood Glucose: No results found for this or any previous visit (from the past 24 hour(s)).    Social History   Tobacco Use  Smoking Status Former Smoker  . Types: Cigars  . Last attempt to quit: 09/07/1986  . Years since quitting: 31.1  Smokeless Tobacco Never Used    Goals Met:  Exercise tolerated well No report of cardiac concerns or symptoms Strength training completed today  Goals Unmet:  Not Applicable  Comments: Service time is from 1030 to 1205    Dr. Rush Farmer is Medical Director for Pulmonary Rehab at [facility].

## 2017-10-21 ENCOUNTER — Encounter (HOSPITAL_COMMUNITY)
Admission: RE | Admit: 2017-10-21 | Discharge: 2017-10-21 | Disposition: A | Discharge status: Home / Self Care | Payer: No Typology Code available for payment source | Source: Ambulatory Visit | Attending: Internal Medicine | Admitting: Internal Medicine

## 2017-10-21 DIAGNOSIS — J449 Chronic obstructive pulmonary disease, unspecified: Secondary | ICD-10-CM | POA: Diagnosis not present

## 2017-10-21 NOTE — Progress Notes (Signed)
Daily Session Note  Patient Details  Name: Kevin Yang MRN: [id number] Date of Birth: 02-Aug-1950 Referring Provider:     Pulmonary Rehab Walk Test from 10/12/2017 in [place]  Referring Provider  Dr. Nelda Marseille      Encounter Date: 10/21/2017  Check In: Session Check In - 10/21/17 1211      Check-In   Location  [facility]    Staff Present  Cloyde Reams Tiffay Pinette, MS, ACSM RCEP, Exercise Physiologist;Lisa Ysidro Evert, RN    Supervising physician immediately available to respond to emergencies  [facility] immediately available    Physician(s)  Dr. Wyline Copas    Medication changes reported      No    Fall or balance concerns reported     No    Tobacco Cessation  No Change    Warm-up and Cool-down  Performed as group-led instruction    Resistance Training Performed  Yes    VAD Patient?  No      Pain Assessment   Currently in Pain?  No/denies    Multiple Pain Sites  No       Capillary Blood Glucose: No results found for this or any previous visit (from the past 24 hour(s)).    Social History   Tobacco Use  Smoking Status Former Smoker  . Types: Cigars  . Last attempt to quit: 09/07/1986  . Years since quitting: 31.1  Smokeless Tobacco Never Used    Goals Met:  Exercise tolerated well No report of cardiac concerns or symptoms Strength training completed today  Goals Unmet:  Not Applicable  Comments: Service time is from 10:30a to 12:00p    Dr. Rush Farmer is Medical Director for Pulmonary Rehab at [facility].

## 2017-10-26 ENCOUNTER — Encounter (HOSPITAL_COMMUNITY)
Admission: RE | Admit: 2017-10-26 | Discharge: 2017-10-26 | Disposition: A | Discharge status: Home / Self Care | Payer: No Typology Code available for payment source | Source: Ambulatory Visit | Attending: Internal Medicine | Admitting: Internal Medicine

## 2017-10-26 VITALS — Wt 231.0 lb

## 2017-10-26 DIAGNOSIS — J449 Chronic obstructive pulmonary disease, unspecified: Secondary | ICD-10-CM

## 2017-10-26 NOTE — Progress Notes (Signed)
Daily Session Note  Patient Details  Name: SIDDARTH HSIUNG MRN: [id number] Date of Birth: 1951/05/14 Referring Provider:     Pulmonary Rehab Walk Test from 10/12/2017 in [place]  Referring Provider  Dr. Nelda Marseille      Encounter Date: 10/26/2017  Check In: Session Check In - 10/26/17 1213      Check-In   Location  [facility]    Staff Present  Cloyde Reams Rondalyn Belford, MS, ACSM RCEP, Exercise Physiologist;Olinty Lignite, MS, ACSM CEP, Exercise Physiologist;Annedrea Rosezella [place], RN, [facility]    Supervising physician immediately available to respond to emergencies  [facility] immediately available    Physician(s)  Dr. Wyline Copas    Medication changes reported      No    Fall or balance concerns reported     No    Tobacco Cessation  No Change    Warm-up and Cool-down  Performed as group-led instruction    Resistance Training Performed  Yes    VAD Patient?  No      Pain Assessment   Currently in Pain?  No/denies    Multiple Pain Sites  No       Capillary Blood Glucose: No results found for this or any previous visit (from the past 24 hour(s)).  Exercise Prescription Changes - 10/26/17 1200      Response to Exercise   Blood Pressure (Admit)  118/72    Blood Pressure (Exercise)  160/56    Blood Pressure (Exit)  116/82    Heart Rate (Admit)  84 bpm    Heart Rate (Exercise)  104 bpm    Heart Rate (Exit)  78 bpm    Oxygen Saturation (Admit)  96 %    Oxygen Saturation (Exercise)  93 %    Oxygen Saturation (Exit)  97 %    Rating of Perceived Exertion (Exercise)  12    Perceived Dyspnea (Exercise)  3    Duration  Progress to 45 minutes of aerobic exercise without signs/symptoms of physical distress    Intensity  THRR unchanged      Progression   Progression  Continue to progress workloads to maintain intensity without signs/symptoms of physical distress.      Resistance Training   Training Prescription  Yes    Weight  blue bands    Reps   10-15    Time  10 Minutes      Treadmill   MPH  1.3    Grade  2    Minutes  17      Bike   Level  1    Minutes  17      NuStep   Level  3    SPM  80    Minutes  17    METs  2.3       Social History   Tobacco Use  Smoking Status Former Smoker  . Types: Cigars  . Last attempt to quit: 09/07/1986  . Years since quitting: 31.1  Smokeless Tobacco Never Used    Goals Met:  Exercise tolerated well No report of cardiac concerns or symptoms Strength training completed today  Goals Unmet:  Not Applicable  Comments: Service time is from 10:30A to 12:00P    Dr. Rush Farmer is Medical Director for Pulmonary Rehab at [facility].

## 2017-10-28 ENCOUNTER — Encounter (HOSPITAL_COMMUNITY)
Admission: RE | Admit: 2017-10-28 | Discharge: 2017-10-28 | Disposition: A | Discharge status: Home / Self Care | Payer: No Typology Code available for payment source | Source: Ambulatory Visit | Attending: Internal Medicine | Admitting: Internal Medicine

## 2017-10-28 DIAGNOSIS — J449 Chronic obstructive pulmonary disease, unspecified: Secondary | ICD-10-CM | POA: Diagnosis not present

## 2017-10-28 NOTE — Progress Notes (Signed)
Daily Session Note  Patient Details  Name: Kevin Yang MRN: [id number] Date of Birth: 06-22-1950 Referring Provider:     Pulmonary Rehab Walk Test from 10/12/2017 in [place]  Referring Provider  Dr. Nelda Marseille      Encounter Date: 10/28/2017  Check In: Session Check In - 10/28/17 1059      Check-In   Location  [facility]    Staff Present  Cloyde Reams Kazandra Forstrom, MS, ACSM RCEP, Exercise Physiologist;Carlette Wilber Oliphant, RN, BSN    Supervising physician immediately available to respond to emergencies  [facility] immediately available    Physician(s)  Dr. Denton Brick    Medication changes reported      No    Fall or balance concerns reported     No    Tobacco Cessation  No Change    Warm-up and Cool-down  Performed as group-led instruction    Resistance Training Performed  Yes    VAD Patient?  No      Pain Assessment   Currently in Pain?  No/denies    Multiple Pain Sites  No       Capillary Blood Glucose: No results found for this or any previous visit (from the past 24 hour(s)).    Social History   Tobacco Use  Smoking Status Former Smoker  . Types: Cigars  . Last attempt to quit: 09/07/1986  . Years since quitting: 31.1  Smokeless Tobacco Never Used    Goals Met:  Exercise tolerated well No report of cardiac concerns or symptoms Strength training completed today  Goals Unmet:  Not Applicable  Comments: Service time is from 10:30A to 12:30P    Dr. Rush Farmer is Medical Director for Pulmonary Rehab at [facility].

## 2017-10-28 NOTE — Progress Notes (Signed)
Kevin Yang 67 y.o. male   DOB: 1950-08-02 MRN: [id number]          Nutrition 1. Chronic obstructive pulmonary disease, unspecified COPD type (HCC)    Past Medical History:  Diagnosis Date  . Asthma   . COPD (chronic obstructive pulmonary disease) (HCC)   . Crohn's disease (HCC)    Meds reviewed.   Ht: Ht Readings from Last 1 Encounters:  10/08/17 5\' 9"  (1.753 m)     Wt:  Wt Readings from Last 3 Encounters:  10/26/17 231 lb 0.7 oz (104.8 kg)  10/08/17 233 lb 0.4 oz (105.7 kg)  10/09/15 223 lb (101.2 kg)     BMI: 34.1    Current tobacco use? No  Labs:  Lipid Panel     Component Value Date/Time   CHOL 282 (H) 10/09/2015 0933   TRIG 180.0 (H) 10/09/2015 0933   HDL 56.90 10/09/2015 0933   CHOLHDL 5 10/09/2015 0933   VLDL 36.0 10/09/2015 0933   LDLCALC 189 (H) 10/09/2015 0933   LDLDIRECT 146.5 04/22/2007 0809   No results found for: HGBA1C Note Spoke with pt. Pt is obese and wants to lose wt. According to EMR, pt has gained 10 lb over the past 3 years. Wt loss tips reviewed. There are some ways the pt can make his eating habits healthier, which were reviewed when pt's Rate Your Plate results discussed. Pt does not avoid salty food; uses canned/ convenience food.  The role of sodium in lung disease reviewed with pt. Pt expressed understanding of the information reviewed via feedback method.    Nutrition Diagnosis ? Food-and nutrition-related knowledge deficit related to lack of exposure to information as related to diagnosis of pulmonary disease ? Obesity related to excessive energy intake as evidenced by a BMI of 34.12  Nutrition Intervention ? Pt's individual nutrition plan and goals reviewed with pt. ? Benefits of adopting healthy eating habits discussed when pt's Rate Your Plate reviewed.  Goal(s) 1. Pt to decrease portion size of meat at meals to ~ 3ounces 2. Pt to add a meatless meal at least 1-2 times/week 3. Pt to increase consumption of whole grains  consumed to at least 1 serving/day, working up to 50% of grains consumed as whole grains Plan:  Pt to attend Pulmonary Nutrition class Will provide client-centered nutrition education as part of interdisciplinary care.   Monitor and evaluate progress toward nutrition goal with team.  Monitor and Evaluate progress toward nutrition goal with team.   Mickle Plumb, M.Ed, RD, LDN, CDE 10/28/2017 12:21 PM

## 2017-11-01 ENCOUNTER — Encounter (HOSPITAL_COMMUNITY): Payer: Self-pay

## 2017-11-01 NOTE — Progress Notes (Signed)
Pulmonary Individual Treatment Plan  Patient Details  Name: Kevin Yang MRN: [id number] Date of Birth: 11-19-1950 Referring Provider:     Pulmonary Rehab Walk Test from 10/12/2017 in [facility] CARDIAC REHAB  Referring Provider  Dr. Molli Knock      Initial Encounter Date:    Pulmonary Rehab Walk Test from 10/12/2017 in [facility] CARDIAC REHAB  Date  10/12/17  Referring Provider  Dr. Molli Knock      Visit Diagnosis: Chronic obstructive pulmonary disease, unspecified COPD type (HCC)  Patient's Home Medications on Admission:   Current Outpatient Medications:  .  albuterol (PROVENTIL HFA;VENTOLIN HFA) 108 (90 BASE) MCG/ACT inhaler, Inhale 2 puffs into the lungs every 6 (six) hours as needed for wheezing or shortness of breath. , Disp: , Rfl:  .  budesonide-formoterol (SYMBICORT) 160-4.5 MCG/ACT inhaler, Inhale 2 puffs into the lungs 2 (two) times daily., Disp: , Rfl:  .  cetirizine (ZYRTEC) 10 MG tablet, Take 10 mg by mouth daily., Disp: , Rfl:  .  diclofenac (VOLTAREN) 75 MG EC tablet, Take 75 mg by mouth 2 (two) times daily., Disp: , Rfl:  .  fluticasone (FLONASE) 50 MCG/ACT nasal spray, Place 1 spray into both nostrils as needed. 2 sprays each nostril once a day, Disp: , Rfl:  .  formoterol (PERFOROMIST) 20 MCG/2ML nebulizer solution, Take 20 mcg by nebulization 4 (four) times daily., Disp: , Rfl:  .  HYDROcodone-acetaminophen (NORCO) 10-325 MG tablet, Take 1 tablet by mouth 2 (two) times daily as needed for severe pain., Disp: , Rfl:  .  inFLIXimab-abda 100 MG SOLR, Inject into the vein., Disp: , Rfl:  .  ipratropium-albuterol (DUONEB) 0.5-2.5 (3) MG/3ML SOLN, Inhale into the lungs., Disp: , Rfl:  .  methylPREDNISolone acetate (DEPO-MEDROL) 40 MG/ML injection, 40 mg once., Disp: , Rfl:  .  sildenafil (VIAGRA) 50 MG tablet, Take 1 tablet (50 mg total) by mouth as needed for erectile dysfunction., Disp: 10 tablet, Rfl: 2 .  simvastatin (ZOCOR) 80 MG  tablet, Take 80 mg by mouth at bedtime., Disp: , Rfl:  .  tamsulosin (FLOMAX) 0.4 MG CAPS, Take 0.4 mg by mouth at bedtime., Disp: , Rfl:   Past Medical History: Past Medical History:  Diagnosis Date  . Asthma   . COPD (chronic obstructive pulmonary disease) (HCC)   . Crohn's disease (HCC)     Tobacco Use: Social History   Tobacco Use  Smoking Status Former Smoker  . Types: Cigars  . Last attempt to quit: 09/07/1986  . Years since quitting: 31.1  Smokeless Tobacco Never Used    Labs: Recent Hydrographic surveyor    Labs for ITP Cardiac and Pulmonary Rehab Latest Ref Rng & Units 04/22/2007 04/07/2009 04/08/2009 10/09/2015   Cholestrol 0 - 200 mg/dL 409(WJ) - 191 ATP III CLASSIFICATION: <200     mg/dL   Desirable 478-295  mg/dL   Borderline High >=621    mg/dL   High(H) 308(M)   LDLCALC 0 - 99 mg/dL - - 578 Total Cholesterol/HDL:CHD Risk Coronary Heart Disease Risk Table Men   Women 1/2 Average Risk   3.4   3.3 Average Risk       5.0   4.4 2 X Average Risk   9.6   7.1 3 X Average Risk  23.4   11.0 Use the calculated Patient Ratio above and the CHD Risk Table to determine the patient's CHD Risk. ATP III CLASSIFICATION (LDL): <100     mg/dL  Optimal 100-129  mg/dL   Near or Above Optimal 130-159  mg/dL   Borderline 358-251  mg/dL   High >898     mg/dL   Very High(H) 421(I)   LDLDIRECT mg/dL 312.8 - - -   HDL >11.88 mg/dL 37.3(L) - 40 56.90   Trlycerides 0.0 - 149.0 mg/dL 677 - 373 668.0(H)   TCO2 0 - 100 mmol/L - 30 - -      Capillary Blood Glucose: No results found for: GLUCAP   Pulmonary Assessment Scores: Pulmonary Assessment Scores    Row Name 10/12/17 1646 10/14/17 1423       ADL UCSD   ADL Phase  Entry  Entry    SOB Score total  -  77      CAT Score   CAT Score  -  25 Entry      mMRC Score   mMRC Score  1  -       Pulmonary Function Assessment: Pulmonary Function Assessment - 10/08/17 1049      Breath   Bilateral Breath Sounds   Clear;Decreased    Shortness of Breath  Yes;Limiting activity       Exercise Target Goals:    Exercise Program Goal: Individual exercise prescription set using results from initial 6 min walk test and THRR while considering  patient's activity barriers and safety.    Exercise Prescription Goal: Initial exercise prescription builds to 30-45 minutes a day of aerobic activity, 2-3 days per week.  Home exercise guidelines will be given to patient during program as part of exercise prescription that the participant will acknowledge.  Activity Barriers & Risk Stratification: Activity Barriers & Cardiac Risk Stratification - 10/08/17 1023      Activity Barriers & Cardiac Risk Stratification   Activity Barriers  Back Problems;Shortness of Breath;Deconditioning       6 Minute Walk: 6 Minute Walk    Row Name 10/12/17 1641 10/12/17 1646       6 Minute Walk   Phase  Initial  Initial    Distance  1600 feet  -    Walk Time  6 minutes  -    # of Rest Breaks  0  -    MPH  3.03  -    METS  3.3  -    RPE  15  -    Perceived Dyspnea   2  -    Symptoms  No  -    Resting HR  96 bpm  -    Resting BP  138/84  -    Resting Oxygen Saturation   95 %  -    Exercise Oxygen Saturation  during 6 min walk  88 %  -    Max Ex. HR  116 bpm  -    Max Ex. BP  114/84  -      Interval HR   1 Minute HR  107  -    2 Minute HR  108  -    3 Minute HR  107  -    4 Minute HR  104  -    5 Minute HR  114  -    6 Minute HR  116  -    2 Minute Post HR  101  -    Interval Heart Rate?  Yes  -      Interval Oxygen   Interval Oxygen?  Yes  -    Baseline Oxygen Saturation %  95 %  -  1 Minute Oxygen Saturation %  94 %  -    1 Minute Liters of Oxygen  0 L  -    2 Minute Oxygen Saturation %  88 %  -    2 Minute Liters of Oxygen  0 L  -    3 Minute Oxygen Saturation %  89 %  -    3 Minute Liters of Oxygen  0 L  -    4 Minute Oxygen Saturation %  89 %  -    4 Minute Liters of Oxygen  0 L  -    5 Minute  Oxygen Saturation %  89 %  -    5 Minute Liters of Oxygen  0 L  -    6 Minute Oxygen Saturation %  91 %  -    6 Minute Liters of Oxygen  0 L  -    2 Minute Post Oxygen Saturation %  96 %  -    2 Minute Post Liters of Oxygen  0 L  -       Oxygen Initial Assessment: Oxygen Initial Assessment - 10/12/17 1646      Initial 6 min Walk   Oxygen Used  None      Program Oxygen Prescription   Program Oxygen Prescription  None       Oxygen Re-Evaluation: Oxygen Re-Evaluation    Row Name 11/02/17 0814             Program Oxygen Prescription   Program Oxygen Prescription  None         Home Oxygen   Home Oxygen Device  None       Sleep Oxygen Prescription  CPAP       Home Exercise Oxygen Prescription  None       Home at Rest Exercise Oxygen Prescription  None          Oxygen Discharge (Final Oxygen Re-Evaluation): Oxygen Re-Evaluation - 11/02/17 0814      Program Oxygen Prescription   Program Oxygen Prescription  None      Home Oxygen   Home Oxygen Device  None    Sleep Oxygen Prescription  CPAP    Home Exercise Oxygen Prescription  None    Home at Rest Exercise Oxygen Prescription  None       Initial Exercise Prescription: Initial Exercise Prescription - 10/12/17 1600      Date of Initial Exercise RX and Referring Provider   Date  10/12/17    Referring Provider  Dr. Molli Knock      Bike   Level  1    Minutes  17      NuStep   Level  2    SPM  80    Minutes  17    METs  1.5      Track   Laps  10    Minutes  17      Prescription Details   Frequency (times per week)  2    Duration  Progress to 45 minutes of aerobic exercise without signs/symptoms of physical distress      Intensity   THRR 40-80% of Max Heartrate  52-124    Ratings of Perceived Exertion  11-13    Perceived Dyspnea  0-4      Progression   Progression  Continue progressive overload as per policy without signs/symptoms or physical distress.      Resistance Training   Training  Prescription  Yes  Weight  blue bands    Reps  10-15       Perform Capillary Blood Glucose checks as needed.  Exercise Prescription Changes:  Exercise Prescription Changes    Row Name 10/26/17 1200 11/02/17 0731           Response to Exercise   Blood Pressure (Admit)  118/72  -      Blood Pressure (Exercise)  160/56  -      Blood Pressure (Exit)  116/82  -      Heart Rate (Admit)  84 bpm  -      Heart Rate (Exercise)  104 bpm  -      Heart Rate (Exit)  78 bpm  -      Oxygen Saturation (Admit)  96 %  -      Oxygen Saturation (Exercise)  93 %  -      Oxygen Saturation (Exit)  97 %  -      Rating of Perceived Exertion (Exercise)  12  -      Perceived Dyspnea (Exercise)  3  -      Duration  Progress to 45 minutes of aerobic exercise without signs/symptoms of physical distress  -      Intensity  THRR unchanged  -        Progression   Progression  Continue to progress workloads to maintain intensity without signs/symptoms of physical distress.  -        Resistance Training   Training Prescription  Yes  -      Weight  blue bands  -      Reps  10-15  -      Time  10 Minutes  -        Treadmill   MPH  1.3  -      Grade  2  -      Minutes  17  -        Bike   Level  1  -      Minutes  17  -        NuStep   Level  3  -      SPM  80  -      Minutes  17  -      METs  2.3  -        Home Exercise Plan   Plans to continue exercise at  -  Home (comment) has treadmill at home      Frequency  -  Add 3 additional days to program exercise sessions.         Exercise Comments:  Exercise Comments    Row Name 11/04/17 0730           Exercise Comments  Home exercise completed          Exercise Goals and Review:  Exercise Goals    Row Name 10/08/17 1024             Exercise Goals   Increase Physical Activity  Yes       Intervention  Provide advice, education, support and counseling about physical activity/exercise needs.;Develop an individualized exercise  prescription for aerobic and resistive training based on initial evaluation findings, risk stratification, comorbidities and participant's personal goals.       Expected Outcomes  Short Term: Attend rehab on a regular basis to increase amount of physical activity.;Long Term: Add in home exercise to make exercise part of routine and  to increase amount of physical activity.;Long Term: Exercising regularly at least 3-5 days a week.       Increase Strength and Stamina  Yes       Intervention  Provide advice, education, support and counseling about physical activity/exercise needs.;Develop an individualized exercise prescription for aerobic and resistive training based on initial evaluation findings, risk stratification, comorbidities and participant's personal goals.       Expected Outcomes  Short Term: Increase workloads from initial exercise prescription for resistance, speed, and METs.;Short Term: Perform resistance training exercises routinely during rehab and add in resistance training at home;Long Term: Improve cardiorespiratory fitness, muscular endurance and strength as measured by increased METs and functional capacity ( )       Able to understand and use rate of perceived exertion (RPE) scale  Yes       Intervention  Provide education and explanation on how to use RPE scale       Expected Outcomes  Short Term: Able to use RPE daily in rehab to express subjective intensity level;Long Term:  Able to use RPE to guide intensity level when exercising independently       Able to understand and use Dyspnea scale  Yes       Intervention  Provide education and explanation on how to use Dyspnea scale       Expected Outcomes  Short Term: Able to use Dyspnea scale daily in rehab to express subjective sense of shortness of breath during exertion;Long Term: Able to use Dyspnea scale to guide intensity level when exercising independently       Knowledge and understanding of Target Heart Rate Range (THRR)  Yes        Intervention  Provide education and explanation of THRR including how the numbers were predicted and where they are located for reference       Expected Outcomes  Short Term: Able to state/look up THRR;Short Term: Able to use daily as guideline for intensity in rehab;Long Term: Able to use THRR to govern intensity when exercising independently       Understanding of Exercise Prescription  Yes       Intervention  Provide education, explanation, and written materials on patient's individual exercise prescription       Expected Outcomes  Short Term: Able to explain program exercise prescription;Long Term: Able to explain home exercise prescription to exercise independently          Exercise Goals Re-Evaluation : Exercise Goals Re-Evaluation    Row Name 11/02/17 [phone]             Exercise Goal Re-Evaluation   Exercise Goals Review  Able to understand and use Dyspnea scale;Increase Strength and Stamina;Increase Physical Activity;Able to understand and use rate of perceived exertion (RPE) scale;Knowledge and understanding of Target Heart Rate Range (THRR);Understanding of Exercise Prescription       Comments  Patient has only attended 4 rehab sessions. Progressing welll-open to workload increases. Attendance has been consistent. Will cont. to monitor and progress as able.        Expected Outcomes  Through exercise at rehab and at home, the patient will decrease shortness of breath with daily activities and feel confident in carrying out an exercise regime at home.           Discharge Exercise Prescription (Final Exercise Prescription Changes): Exercise Prescription Changes - 11/02/17 0731      Home Exercise Plan   Plans to continue exercise at  Home (comment) has treadmill at  home    Frequency  Add 3 additional days to program exercise sessions.       Nutrition:  Target Goals: Understanding of nutrition guidelines, daily intake of sodium 1500mg , cholesterol 200mg , calories 30% from  fat and 7% or less from saturated fats, daily to have 5 or more servings of fruits and vegetables.  Biometrics: Pre Biometrics - 10/08/17 1058      Pre Biometrics   Grip Strength  31 kg        Nutrition Therapy Plan and Nutrition Goals: Nutrition Therapy & Goals - 10/28/17 1230      Nutrition Therapy   Diet  General, healthful      Personal Nutrition Goals   Nutrition Goal  Pt to decrease portion size of meat at meals to ~ 3ounces    Personal Goal #2  Pt to add a meatless meal at least 1-2 times/week    Personal Goal #3  Pt to increase consumption of whole grains consumed to at least 1 serving/day, working up to 50% of grains consumed as whole grains      Intervention Plan   Intervention  Prescribe, educate and counsel regarding individualized specific dietary modifications aiming towards targeted core components such as weight, hypertension, lipid management, diabetes, heart failure and other comorbidities.    Expected Outcomes  Short Term Goal: Understand basic principles of dietary content, such as calories, fat, sodium, cholesterol and nutrients.;Long Term Goal: Adherence to prescribed nutrition plan.       Nutrition Assessments: Nutrition Assessments - 10/28/17 1221      Rate Your Plate Scores   Pre Score  47       Nutrition Goals Re-Evaluation:   Nutrition Goals Discharge (Final Nutrition Goals Re-Evaluation):   Psychosocial: Target Goals: Acknowledge presence or absence of significant depression and/or stress, maximize coping skills, provide positive support system. Participant is able to verbalize types and ability to use techniques and skills needed for reducing stress and depression.  Initial Review & Psychosocial Screening: Initial Psych Review & Screening - 10/08/17 1050      Initial Review   Current issues with  Current Stress Concerns lovingly cares for his wife with chronic illness.     Source of Stress Concerns  Family    Comments  intermittent mild  caregiver stress however patients sister-in-law willingly steps in to care for patient's wife to allow patient to have some "me time"      Family Dynamics   Good Support System?  Yes church family and friends      Barriers   Psychosocial barriers to participate in program  The patient should benefit from training in stress management and relaxation.      Screening Interventions   Interventions  Encouraged to exercise;Provide feedback about the scores to participant    Expected Outcomes  Short Term goal: Utilizing psychosocial counselor, staff and physician to assist with identification of specific Stressors or current issues interfering with healing process. Setting desired goal for each stressor or current issue identified.;Long Term Goal: Stressors or current issues are controlled or eliminated.       Quality of Life Scores:  Scores of 19 and below usually indicate a poorer quality of life in these areas.  A difference of  2-3 points is a clinically meaningful difference.  A difference of 2-3 points in the total score of the Quality of Life Index has been associated with significant improvement in overall quality of life, self-image, physical symptoms, and general health in studies  assessing change in quality of life.   PHQ-9: Recent Review Flowsheet Data    Depression screen [facility] 2/9 10/08/2017 10/09/2015 07/05/2014   Decreased Interest 0 0 0   Down, Depressed, Hopeless 0 0 0   PHQ - 2 Score 0 0 0     Interpretation of Total Score  Total Score Depression Severity:  1-4 = Minimal depression, 5-9 = Mild depression, 10-14 = Moderate depression, 15-19 = Moderately severe depression, 20-27 = Severe depression   Psychosocial Evaluation and Intervention: Psychosocial Evaluation - 11/01/17 1614      Psychosocial Evaluation & Interventions   Comments  Pt contributes to discussions during the education classes and interacts well with fellow participants and staff.  Pt participates in his hobby  of fishing       Psychosocial Re-Evaluation: Psychosocial Re-Evaluation    Row Name 11/01/17 1611             Psychosocial Re-Evaluation   Current issues with  Current Stress Concerns       Expected Outcomes  Pt will reach out to other family members when he has caregiver fatigue/stress.  Pt will utilize community resources that offer respite care.  Pt will employ strategies to maintain his health both physically and mentally.       Interventions  Stress management education;Encouraged to attend Pulmonary Rehabilitation for the exercise;Relaxation education       Continue Psychosocial Services   Follow up required by staff       Comments  intermittent mild caregiver stress however patients sister-in-law willingly steps in to care for patient's wife to allow patient to have some "me time"         Initial Review   Source of Stress Concerns  Family          Psychosocial Discharge (Final Psychosocial Re-Evaluation): Psychosocial Re-Evaluation - 11/01/17 1611      Psychosocial Re-Evaluation   Current issues with  Current Stress Concerns    Expected Outcomes  Pt will reach out to other family members when he has caregiver fatigue/stress.  Pt will utilize community resources that offer respite care.  Pt will employ strategies to maintain his health both physically and mentally.    Interventions  Stress management education;Encouraged to attend Pulmonary Rehabilitation for the exercise;Relaxation education    Continue Psychosocial Services   Follow up required by staff    Comments  intermittent mild caregiver stress however patients sister-in-law willingly steps in to care for patient's wife to allow patient to have some "me time"      Initial Review   Source of Stress Concerns  Family       Education: Education Goals: Education classes will be provided on a weekly basis, covering required topics. Participant will state understanding/return demonstration of topics  presented.  Learning Barriers/Preferences: Learning Barriers/Preferences - 10/08/17 1049      Learning Barriers/Preferences   Learning Barriers  None    Learning Preferences  Skilled Demonstration;Written Material       Education Topics: Risk Factor Reduction:  -Group instruction that is supported by a PowerPoint presentation. Instructor discusses the definition of a risk factor, different risk factors for pulmonary disease, and how the heart and lungs work together.     Nutrition for Pulmonary Patient:  -Group instruction provided by PowerPoint slides, verbal discussion, and written materials to support subject matter. The instructor gives an explanation and review of healthy diet recommendations, which includes a discussion on weight management, recommendations for fruit and vegetable consumption,  as well as protein, fluid, caffeine, fiber, sodium, sugar, and alcohol. Tips for eating when patients are short of breath are discussed.   Pursed Lip Breathing:  -Group instruction that is supported by demonstration and informational handouts. Instructor discusses the benefits of pursed lip and diaphragmatic breathing and detailed demonstration on how to preform both.     Oxygen Safety:  -Group instruction provided by PowerPoint, verbal discussion, and written material to support subject matter. There is an overview of "What is Oxygen" and "Why do we need it".  Instructor also reviews how to create a safe environment for oxygen use, the importance of using oxygen as prescribed, and the risks of noncompliance. There is a brief discussion on traveling with oxygen and resources the patient may utilize.   Oxygen Equipment:  -Group instruction provided by [facility] Staff utilizing handouts, written materials, and equipment demonstrations.   Signs and Symptoms:  -Group instruction provided by written material and verbal discussion to support subject matter. Warning signs and symptoms of  infection, stroke, and heart attack are reviewed and when to call the physician/911 reinforced. Tips for preventing the spread of infection discussed.   Advanced Directives:  -Group instruction provided by verbal instruction and written material to support subject matter. Instructor reviews Advanced Directive laws and proper instruction for filling out document.   Pulmonary Video:  -Group video education that reviews the importance of medication and oxygen compliance, exercise, good nutrition, pulmonary hygiene, and pursed lip and diaphragmatic breathing for the pulmonary patient.   Exercise for the Pulmonary Patient:  -Group instruction that is supported by a PowerPoint presentation. Instructor discusses benefits of exercise, core components of exercise, frequency, duration, and intensity of an exercise routine, importance of utilizing pulse oximetry during exercise, safety while exercising, and options of places to exercise outside of rehab.     Pulmonary Medications:  -Verbally interactive group education provided by instructor with focus on inhaled medications and proper administration.   Anatomy and Physiology of the Respiratory System and Intimacy:  -Group instruction provided by PowerPoint, verbal discussion, and written material to support subject matter. Instructor reviews respiratory cycle and anatomical components of the respiratory system and their functions. Instructor also reviews differences in obstructive and restrictive respiratory diseases with examples of each. Intimacy, Sex, and Sexuality differences are reviewed with a discussion on how relationships can change when diagnosed with pulmonary disease. Common sexual concerns are reviewed.   PULMONARY REHAB CHRONIC OBSTRUCTIVE PULMONARY DISEASE from 10/28/2017 in [facility] CARDIAC REHAB  Date  10/28/17  Educator  rn  Instruction Review Code  2- Demonstrated Understanding      MD DAY -A group question  and answer session with a medical doctor that allows participants to ask questions that relate to their pulmonary disease state.   OTHER EDUCATION -Group or individual verbal, written, or video instructions that support the educational goals of the pulmonary rehab program.   Holiday Eating Survival Tips:  -Group instruction provided by PowerPoint slides, verbal discussion, and written materials to support subject matter. The instructor gives patients tips, tricks, and techniques to help them not only survive but enjoy the holidays despite the onslaught of food that accompanies the holidays.   Knowledge Questionnaire Score: Knowledge Questionnaire Score - 10/14/17 1421      Knowledge Questionnaire Score   Pre Score  9/18       Core Components/Risk Factors/Patient Goals at Admission: Personal Goals and Risk Factors at Admission - 10/08/17 1050  Core Components/Risk Factors/Patient Goals on Admission    Weight Management  Yes    Intervention  Weight Management/Obesity: Establish reasonable short term and long term weight goals.;Obesity: Provide education and appropriate resources to help participant work on and attain dietary goals.;Weight Management: Provide education and appropriate resources to help participant work on and attain dietary goals.;Weight Management: Develop a combined nutrition and exercise program designed to reach desired caloric intake, while maintaining appropriate intake of nutrient and fiber, sodium and fats, and appropriate energy expenditure required for the weight goal.    Expected Outcomes  Short Term: Continue to assess and modify interventions until short term weight is achieved;Long Term: Adherence to nutrition and physical activity/exercise program aimed toward attainment of established weight goal;Weight Maintenance: Understanding of the daily nutrition guidelines, which includes 25-35% calories from fat, 7% or less cal from saturated fats, less than 200mg   cholesterol, less than 1.5gm of sodium, & 5 or more servings of fruits and vegetables daily;Weight Loss: Understanding of general recommendations for a balanced deficit meal plan, which promotes 1-2 lb weight loss per week and includes a negative energy balance of [phone] kcal/d;Understanding recommendations for meals to include 15-35% energy as protein, 25-35% energy from fat, 35-60% energy from carbohydrates, less than 200mg  of dietary cholesterol, 20-35 gm of total fiber daily;Understanding of distribution of calorie intake throughout the day with the consumption of 4-5 meals/snacks    Improve shortness of breath with ADL's  Yes    Intervention  Provide education, individualized exercise plan and daily activity instruction to help decrease symptoms of SOB with activities of daily living.    Expected Outcomes  Short Term: Improve cardiorespiratory fitness to achieve a reduction of symptoms when performing ADLs;Long Term: Be able to perform more ADLs without symptoms or delay the onset of symptoms       Core Components/Risk Factors/Patient Goals Review:  Goals and Risk Factor Review    Row Name 11/01/17 1601 11/01/17 1604           Core Components/Risk Factors/Patient Goals Review   Personal Goals Review  Weight Management/Obesity;Improve shortness of breath with ADL's  -      Review  Pt is off to a great start with his participation in pulmonary rehab.  Pt desires   Pt is off to a great start with his participation in pulmonary rehab.  Pt desires to losse weight. During his two week participation in pulmonary rehab, he has lost 1.4 kg.  Pt reports he continues to have shortness of breath but feels it is managed better with PLB and diaphragmatic breaathing. Pt maintains o2 sat in the mid 90's at rest and mid to low 90's on exertion      Expected Outcomes  -  Pt will achieve his desired weight loss and report improved management of his shortness of breath.         Core Components/Risk  Factors/Patient Goals at Discharge (Final Review):  Goals and Risk Factor Review - 11/01/17 1604      Core Components/Risk Factors/Patient Goals Review   Review  Pt is off to a great start with his participation in pulmonary rehab.  Pt desires to losse weight. During his two week participation in pulmonary rehab, he has lost 1.4 kg.  Pt reports he continues to have shortness of breath but feels it is managed better with PLB and diaphragmatic breaathing. Pt maintains o2 sat in the mid 90's at rest and mid to low 90's on exertion    Expected  Outcomes  Pt will achieve his desired weight loss and report improved management of his shortness of breath.       ITP Comments: ITP Comments    Row Name 11/03/17 1042           ITP Comments  Dr. Koren Bound, Medical Director          Comments: Pt has completed 5 exercise sessions. Alanson Aly, BSN Cardiac and Emergency planning/management officer

## 2017-11-02 ENCOUNTER — Encounter (HOSPITAL_COMMUNITY)
Admission: RE | Admit: 2017-11-02 | Discharge: 2017-11-02 | Disposition: A | Discharge status: Home / Self Care | Payer: No Typology Code available for payment source | Source: Ambulatory Visit | Attending: Internal Medicine | Admitting: Internal Medicine

## 2017-11-02 DIAGNOSIS — J449 Chronic obstructive pulmonary disease, unspecified: Secondary | ICD-10-CM | POA: Diagnosis present

## 2017-11-02 NOTE — Progress Notes (Signed)
Daily Session Note  Patient Details  Name: Kevin Yang MRN: [id number] Date of Birth: Mar 25, 1951 Referring Provider:     Pulmonary Rehab Walk Test from 10/12/2017 in [place]  Referring Provider  Dr. Nelda Marseille      Encounter Date: 11/02/2017  Check In: Session Check In - 11/02/17 1219      Check-In   Location  [facility]    Staff Present  Su Hilt, MS, ACSM RCEP, Exercise Physiologist;Carlette Wilber Oliphant, Therapist, sports, BSN;Ramon Dredge, RN, MHA;Aadin Gaut Ysidro Evert, RN    Supervising physician immediately available to respond to emergencies  [facility] immediately available    Physician(s)   Dr. Jonnie Finner    Medication changes reported      No    Fall or balance concerns reported     No    Tobacco Cessation  No Change    Warm-up and Cool-down  Performed as group-led instruction    Resistance Training Performed  Yes    VAD Patient?  No      Pain Assessment   Currently in Pain?  No/denies    Multiple Pain Sites  No       Capillary Blood Glucose: No results found for this or any previous visit (from the past 24 hour(s)).    Social History   Tobacco Use  Smoking Status Former Smoker  . Types: Cigars  . Last attempt to quit: 09/07/1986  . Years since quitting: 31.1  Smokeless Tobacco Never Used    Goals Met:  Exercise tolerated well No report of cardiac concerns or symptoms Strength training completed today  Goals Unmet:  Not Applicable  Comments: Service time is from 1030 to 1210    Dr. Rush Farmer is Medical Director for Pulmonary Rehab at [facility].

## 2017-11-04 ENCOUNTER — Encounter (HOSPITAL_COMMUNITY)
Admission: RE | Admit: 2017-11-04 | Discharge: 2017-11-04 | Disposition: A | Discharge status: Home / Self Care | Payer: No Typology Code available for payment source | Source: Ambulatory Visit | Attending: Internal Medicine | Admitting: Internal Medicine

## 2017-11-04 DIAGNOSIS — J449 Chronic obstructive pulmonary disease, unspecified: Secondary | ICD-10-CM | POA: Diagnosis not present

## 2017-11-04 NOTE — Progress Notes (Addendum)
I have reviewed a Home Exercise Prescription with Kevin Yang . Kevin Yang is currently exercising at home.  The patient was advised to walk 3 days a week for 30 minutes.  Kevin Yang and I discussed how to progress their exercise prescription.  The patient stated that their goals were to strengthen diaphragm, lose weight, increase endurance and stamina .  The patient stated that they understand the exercise prescription.  We reviewed exercise guidelines, target heart rate during exercise, RPE Scale, weather conditions, NTG use, endpoints for exercise, warmup and cool down.  Patient is encouraged to come to me with any questions. I will continue to follow up with the patient to assist them with progression and safety.

## 2017-11-04 NOTE — Progress Notes (Signed)
Daily Session Note  Patient Details  Name: Kevin Yang MRN: [id number] Date of Birth: [birth year] Referring Provider:     Pulmonary Rehab Walk Test from 10/12/2017 in [place]  Referring Provider  Dr. Nelda Marseille      Encounter Date: 11/04/2017  Check In: Session Check In - 11/04/17 1116      Check-In   Staff Present  Rodney Langton, RN;Jillianne Gamino, MS, ACSM RCEP, Exercise Physiologist;Carlette Wilber Oliphant, RN, BSN    Supervising physician immediately available to respond to emergencies  [facility] immediately available    Physician(s)  Dr. Tana Coast    Medication changes reported      No    Fall or balance concerns reported     No    Tobacco Cessation  No Change    Warm-up and Cool-down  Performed as group-led instruction    Resistance Training Performed  Yes    VAD Patient?  No      Pain Assessment   Currently in Pain?  No/denies    Multiple Pain Sites  No       Capillary Blood Glucose: No results found for this or any previous visit (from the past 24 hour(s)).    Social History   Tobacco Use  Smoking Status Former Smoker  . Types: Cigars  . Last attempt to quit: 09/07/1986  . Years since quitting: 31.1  Smokeless Tobacco Never Used    Goals Met:  Exercise tolerated well No report of cardiac concerns or symptoms Strength training completed today  Goals Unmet:  Not Applicable  Comments: Service time is from 10:30a to 12:30p    Dr. Rush Farmer is Medical Director for Pulmonary Rehab at [facility].

## 2017-11-09 ENCOUNTER — Encounter (HOSPITAL_COMMUNITY)
Admission: RE | Admit: 2017-11-09 | Discharge: 2017-11-09 | Disposition: A | Discharge status: Home / Self Care | Payer: No Typology Code available for payment source | Source: Ambulatory Visit | Attending: Internal Medicine | Admitting: Internal Medicine

## 2017-11-09 VITALS — Wt 229.7 lb

## 2017-11-09 DIAGNOSIS — J449 Chronic obstructive pulmonary disease, unspecified: Secondary | ICD-10-CM | POA: Diagnosis not present

## 2017-11-09 NOTE — Progress Notes (Signed)
Daily Session Note  Patient Details  Name: Kevin Yang MRN: [id number] Date of Birth: [birth year] Referring Provider:     Pulmonary Rehab Walk Test from 10/12/2017 in [place]  Referring Provider  Dr. Nelda Marseille      Encounter Date: 11/09/2017  Check In: Session Check In - 11/09/17 1030      Check-In   Location  [facility]    Staff Present  Rosebud Poles, RN, BSN;Molly DiVincenzo, MS, ACSM RCEP, Exercise Physiologist;Lisa Ysidro Evert, Felipe Drone, RN, MHA;Olinty Celesta Aver, MS, ACSM CEP, Exercise Physiologist;Carlette Wilber Oliphant, RN, BSN    Supervising physician immediately available to respond to emergencies  [facility] immediately available    Physician(s)  Dr. Tana Coast    Medication changes reported      No    Fall or balance concerns reported     No    Tobacco Cessation  No Change    Warm-up and Cool-down  Performed as group-led instruction    Resistance Training Performed  Yes    VAD Patient?  No      Pain Assessment   Multiple Pain Sites  No       Capillary Blood Glucose: No results found for this or any previous visit (from the past 24 hour(s)).  Exercise Prescription Changes - 11/09/17 1200      Response to Exercise   Blood Pressure (Admit)  124/76    Blood Pressure (Exercise)  120/70    Blood Pressure (Exit)  114/76    Heart Rate (Admit)  84 bpm    Heart Rate (Exercise)  113 bpm    Heart Rate (Exit)  69 bpm    Oxygen Saturation (Admit)  96 %    Oxygen Saturation (Exercise)  95 %    Oxygen Saturation (Exit)  95 %    Rating of Perceived Exertion (Exercise)  13    Perceived Dyspnea (Exercise)  2    Duration  Progress to 45 minutes of aerobic exercise without signs/symptoms of physical distress    Intensity  THRR unchanged      Progression   Progression  Continue to progress workloads to maintain intensity without signs/symptoms of physical distress.      Resistance Training   Training Prescription  Yes     Weight  blue bands    Reps  10-15    Time  10 Minutes      Treadmill   MPH  2.3    Grade  3    Minutes  17      Bike   Level  1    Minutes  17      NuStep   Level  4    SPM  80    Minutes  17    METs  3       Social History   Tobacco Use  Smoking Status Former Smoker  . Types: Cigars  . Last attempt to quit: 09/07/1986  . Years since quitting: 31.1  Smokeless Tobacco Never Used    Goals Met:  Exercise tolerated well Strength training completed today  Goals Unmet:  Not Applicable  Comments: Service time is from 1030 to 1225.    Dr. Rush Farmer is Medical Director for Pulmonary Rehab at [facility].

## 2017-11-11 ENCOUNTER — Encounter (HOSPITAL_COMMUNITY)
Admission: RE | Admit: 2017-11-11 | Discharge: 2017-11-11 | Disposition: A | Discharge status: Home / Self Care | Payer: No Typology Code available for payment source | Source: Ambulatory Visit | Attending: Internal Medicine | Admitting: Internal Medicine

## 2017-11-11 DIAGNOSIS — J449 Chronic obstructive pulmonary disease, unspecified: Secondary | ICD-10-CM | POA: Diagnosis not present

## 2017-11-11 NOTE — Progress Notes (Signed)
Daily Session Note  Patient Details  Name: ZADEN SAKO MRN: [id number] Date of Birth: 1951-03-31 Referring Provider:     Pulmonary Rehab Walk Test from 10/12/2017 in [place]  Referring Provider  Dr. Nelda Marseille      Encounter Date: 11/11/2017  Check In: Session Check In - 11/11/17 1030      Check-In   Location  [facility]    Staff Present  Rosebud Poles, RN, BSN;Molly DiVincenzo, MS, ACSM RCEP, Exercise Physiologist;Lisa Ysidro Evert, RN;Carlette Wilber Oliphant, RN, BSN;Ramon Dredge, RN, [facility]    Supervising physician immediately available to respond to emergencies  [facility] immediately available    Physician(s)  Dr. Broadus John    Medication changes reported      No    Fall or balance concerns reported     No    Tobacco Cessation  No Change    Warm-up and Cool-down  Performed as group-led instruction    Resistance Training Performed  Yes    VAD Patient?  No      Pain Assessment   Currently in Pain?  No/denies    Multiple Pain Sites  No       Capillary Blood Glucose: No results found for this or any previous visit (from the past 24 hour(s)).    Social History   Tobacco Use  Smoking Status Former Smoker  . Types: Cigars  . Last attempt to quit: 09/07/1986  . Years since quitting: 31.2  Smokeless Tobacco Never Used    Goals Met:  Exercise tolerated well Strength training completed today  Goals Unmet:  Not Applicable  Comments: Service time is from 1030 to 1215   Dr. Rush Farmer is Medical Director for Pulmonary Rehab at [facility].

## 2017-11-16 ENCOUNTER — Encounter (HOSPITAL_COMMUNITY)
Admission: RE | Admit: 2017-11-16 | Discharge: 2017-11-16 | Disposition: A | Discharge status: Home / Self Care | Payer: No Typology Code available for payment source | Source: Ambulatory Visit | Attending: Internal Medicine | Admitting: Internal Medicine

## 2017-11-16 VITALS — Wt 228.8 lb

## 2017-11-16 DIAGNOSIS — J449 Chronic obstructive pulmonary disease, unspecified: Secondary | ICD-10-CM

## 2017-11-16 NOTE — Progress Notes (Signed)
Daily Session Note  Patient Details  Name: Kevin Yang MRN: [id number] Date of Birth: November 10, 1950 Referring Provider:     Pulmonary Rehab Walk Test from 10/12/2017 in [place]  Referring Provider  Dr. Nelda Marseille      Encounter Date: 11/16/2017  Check In: Session Check In - 11/16/17 1030      Check-In   Location  [facility]    Staff Present  Rosebud Poles, RN, BSN;Molly DiVincenzo, MS, ACSM RCEP, Exercise Physiologist;Lisa Ysidro Evert, RN;Carlette Wilber Oliphant, RN, BSN;Ramon Dredge, RN, [facility]    Supervising physician immediately available to respond to emergencies  [facility] immediately available    Physician(s)  Dr. Broadus John    Medication changes reported      No    Fall or balance concerns reported     No    Tobacco Cessation  No Change    Warm-up and Cool-down  Performed as group-led instruction    Resistance Training Performed  Yes    VAD Patient?  No      Pain Assessment   Currently in Pain?  No/denies    Multiple Pain Sites  No       Capillary Blood Glucose: No results found for this or any previous visit (from the past 24 hour(s)).    Social History   Tobacco Use  Smoking Status Former Smoker  . Types: Cigars  . Last attempt to quit: 09/07/1986  . Years since quitting: 31.2  Smokeless Tobacco Never Used    Goals Met:  Exercise tolerated well Strength training completed today  Goals Unmet:  Not Applicable  Comments: Service time is from 1030 to 1210   Dr. Rush Farmer is Medical Director for Pulmonary Rehab at [facility]

## 2017-11-18 ENCOUNTER — Encounter (HOSPITAL_COMMUNITY)
Admission: RE | Admit: 2017-11-18 | Discharge: 2017-11-18 | Disposition: A | Discharge status: Home / Self Care | Payer: No Typology Code available for payment source | Source: Ambulatory Visit | Attending: Internal Medicine | Admitting: Internal Medicine

## 2017-11-18 VITALS — Wt 228.4 lb

## 2017-11-18 DIAGNOSIS — J449 Chronic obstructive pulmonary disease, unspecified: Secondary | ICD-10-CM | POA: Diagnosis not present

## 2017-11-18 NOTE — Progress Notes (Signed)
Daily Session Note  Patient Details  Name: Kevin Yang MRN: [id number] Date of Birth: Oct 08, 1950 Referring Provider:     Pulmonary Rehab Walk Test from 10/12/2017 in [place]  Referring Provider  Dr. Nelda Marseille      Encounter Date: 11/18/2017  Check In: Session Check In - 11/18/17 1030      Check-In   Location  [facility]    Staff Present  Rosebud Poles, RN, BSN;Molly DiVincenzo, MS, ACSM RCEP, Exercise Physiologist;Lisa Ysidro Evert, Felipe Drone, RN, [facility]    Supervising physician immediately available to respond to emergencies  [facility] immediately available    Physician(s)  Dr. Herbert Moors    Medication changes reported      No    Fall or balance concerns reported     No    Tobacco Cessation  No Change    Warm-up and Cool-down  Performed as group-led instruction    Resistance Training Performed  Yes    VAD Patient?  No      Pain Assessment   Currently in Pain?  No/denies    Multiple Pain Sites  No       Capillary Blood Glucose: No results found for this or any previous visit (from the past 24 hour(s)).    Social History   Tobacco Use  Smoking Status Former Smoker  . Types: Cigars  . Last attempt to quit: 09/07/1986  . Years since quitting: 31.2  Smokeless Tobacco Never Used    Goals Met:  Exercise tolerated well Strength training completed today  Goals Unmet:  Not Applicable  Comments: Service time is from 1030 to 1220    Dr. Rush Farmer is Medical Director for Pulmonary Rehab at [facility].

## 2017-11-23 ENCOUNTER — Telehealth (HOSPITAL_COMMUNITY): Payer: Self-pay | Admitting: *Deleted

## 2017-11-23 ENCOUNTER — Encounter (HOSPITAL_COMMUNITY)
Admission: RE | Admit: 2017-11-23 | Discharge: 2017-11-23 | Disposition: A | Discharge status: Home / Self Care | Payer: No Typology Code available for payment source | Source: Ambulatory Visit | Attending: Internal Medicine | Admitting: Internal Medicine

## 2017-11-23 DIAGNOSIS — J449 Chronic obstructive pulmonary disease, unspecified: Secondary | ICD-10-CM | POA: Diagnosis not present

## 2017-11-23 NOTE — Progress Notes (Signed)
Daily Session Note  Patient Details  Name: Kevin Yang MRN: [id number] Date of Birth: 02/21/1951 Referring Provider:     Pulmonary Rehab Walk Test from 10/12/2017 in [place]  Referring Provider  Dr. Nelda Marseille      Encounter Date: 11/23/2017  Check In:   Capillary Blood Glucose: No results found for this or any previous visit (from the past 24 hour(s)).  Exercise Prescription Changes - 11/23/17 1200      Response to Exercise   Blood Pressure (Admit)  104/80    Blood Pressure (Exercise)  122/78    Blood Pressure (Exit)  120/80    Heart Rate (Admit)  84 bpm    Heart Rate (Exercise)  113 bpm    Heart Rate (Exit)  85 bpm    Oxygen Saturation (Admit)  97 %    Oxygen Saturation (Exercise)  93 %    Oxygen Saturation (Exit)  97 %    Rating of Perceived Exertion (Exercise)  13    Perceived Dyspnea (Exercise)  1    Duration  Progress to 45 minutes of aerobic exercise without signs/symptoms of physical distress    Intensity  THRR unchanged      Progression   Progression  Continue to progress workloads to maintain intensity without signs/symptoms of physical distress.      Resistance Training   Training Prescription  Yes    Weight  blue bands    Reps  10-15    Time  10 Minutes      Treadmill   MPH  1.2    Grade  4    Minutes  17      Bike   Level  1.4    Minutes  17      NuStep   Level  5    SPM  80    Minutes  17    METs  2.7       Social History   Tobacco Use  Smoking Status Former Smoker  . Types: Cigars  . Last attempt to quit: 09/07/1986  . Years since quitting: 31.2  Smokeless Tobacco Never Used    Goals Met:  Exercise tolerated well Strength training completed today  Goals Unmet:  Not Applicable  Comments: Service time is from 1030 to 1220   Dr. Rush Farmer is Medical Director for Pulmonary Rehab at [facility].

## 2017-11-25 ENCOUNTER — Encounter (HOSPITAL_COMMUNITY)
Admission: RE | Admit: 2017-11-25 | Discharge: 2017-11-25 | Disposition: A | Discharge status: Home / Self Care | Payer: No Typology Code available for payment source | Source: Ambulatory Visit | Attending: Internal Medicine | Admitting: Internal Medicine

## 2017-11-25 VITALS — Wt 228.2 lb

## 2017-11-25 DIAGNOSIS — J449 Chronic obstructive pulmonary disease, unspecified: Secondary | ICD-10-CM | POA: Diagnosis not present

## 2017-11-25 NOTE — Progress Notes (Signed)
Daily Session Note  Patient Details  Name: Kevin Yang MRN: [id number] Date of Birth: [birth year] Referring Provider:     Pulmonary Rehab Walk Test from 10/12/2017 in [place]  Referring Provider  Dr. Nelda Marseille      Encounter Date: 11/25/2017  Check In: Session Check In - 11/25/17 1115      Check-In   Location  [facility]    Staff Present  Rosebud Poles, RN, BSN;Molly DiVincenzo, MS, ACSM RCEP, Exercise Physiologist;Lisa Ysidro Evert, Felipe Drone, RN, [facility]    Supervising physician immediately available to respond to emergencies  [facility] immediately available    Physician(s)  Dr. Bonner Puna    Medication changes reported      No    Fall or balance concerns reported     No    Tobacco Cessation  No Change    Warm-up and Cool-down  Performed as group-led instruction    Resistance Training Performed  Yes    VAD Patient?  No    PAD/SET Patient?  No      Pain Assessment   Currently in Pain?  No/denies    Multiple Pain Sites  No       Capillary Blood Glucose: No results found for this or any previous visit (from the past 24 hour(s)).    Social History   Tobacco Use  Smoking Status Former Smoker  . Types: Cigars  . Last attempt to quit: 09/07/1986  . Years since quitting: 31.2  Smokeless Tobacco Never Used    Goals Met:  Exercise tolerated well Strength training completed today  Goals Unmet:  Not Applicable  Comments: Service time is from 1115 to 1330    Dr. Rush Farmer is Medical Director for Pulmonary Rehab at [facility].

## 2017-11-30 ENCOUNTER — Encounter (HOSPITAL_COMMUNITY)
Admission: RE | Admit: 2017-11-30 | Discharge: 2017-11-30 | Disposition: A | Discharge status: Home / Self Care | Payer: No Typology Code available for payment source | Source: Ambulatory Visit | Attending: Internal Medicine | Admitting: Internal Medicine

## 2017-11-30 VITALS — Wt 226.0 lb

## 2017-11-30 DIAGNOSIS — J449 Chronic obstructive pulmonary disease, unspecified: Secondary | ICD-10-CM | POA: Diagnosis not present

## 2017-11-30 NOTE — Progress Notes (Signed)
Daily Session Note  Patient Details  Name: Kevin Yang MRN: [id number] Date of Birth: 1951-03-17 Referring Provider:     Pulmonary Rehab Walk Test from 10/12/2017 in [place]  Referring Provider  Dr. Nelda Marseille      Encounter Date: 11/30/2017  Check In: Session Check In - 11/30/17 1030      Check-In   Location  [facility]    Staff Present  Rosebud Poles, RN, BSN;Carlette Carlton, RN, [facility] DiVincenzo, MS, ACSM RCEP, Exercise Physiologist;Annedrea Rosezella [place], RN, MHA;Lisa Ysidro Evert, RN    Supervising physician immediately available to respond to emergencies  [facility] immediately available    Physician(s)  Dr. Bonner Puna    Medication changes reported      No    Fall or balance concerns reported     No    Tobacco Cessation  No Change    Warm-up and Cool-down  Performed as group-led instruction    Resistance Training Performed  Yes    VAD Patient?  No    PAD/SET Patient?  No      Pain Assessment   Currently in Pain?  No/denies    Multiple Pain Sites  No       Capillary Blood Glucose: No results found for this or any previous visit (from the past 24 hour(s)).    Social History   Tobacco Use  Smoking Status Former Smoker  . Types: Cigars  . Last attempt to quit: 09/07/1986  . Years since quitting: 31.2  Smokeless Tobacco Never Used    Goals Met:  Exercise tolerated well Strength training completed today  Goals Unmet:  Not Applicable  Comments: Service time is from 1030 to 1215   Dr. Rush Farmer is Medical Director for Pulmonary Rehab at [facility].

## 2017-11-30 NOTE — Progress Notes (Signed)
Pulmonary Individual Treatment Plan  Patient Details  Name: Kevin Yang MRN: [id number] Date of Birth: 07-17-1950 Referring Provider:     Pulmonary Rehab Walk Test from 10/12/2017 in [facility]  Referring Provider  Dr. Nelda Marseille      Initial Encounter Date:    Pulmonary Rehab Walk Test from 10/12/2017 in [facility]  Date  10/12/17      Visit Diagnosis: Chronic obstructive pulmonary disease, unspecified COPD type ([place])  Patient's Home Medications on Admission:   Current Outpatient Medications:  .  albuterol (PROVENTIL HFA;VENTOLIN HFA) 108 (90 BASE) MCG/ACT inhaler, Inhale 2 puffs into the lungs every 6 (six) hours as needed for wheezing or shortness of breath. , Disp: , Rfl:  .  budesonide-formoterol (SYMBICORT) 160-4.5 MCG/ACT inhaler, Inhale 2 puffs into the lungs 2 (two) times daily., Disp: , Rfl:  .  cetirizine (ZYRTEC) 10 MG tablet, Take 10 mg by mouth daily., Disp: , Rfl:  .  diclofenac (VOLTAREN) 75 MG EC tablet, Take 75 mg by mouth 2 (two) times daily., Disp: , Rfl:  .  fluticasone (FLONASE) 50 MCG/ACT nasal spray, Place 1 spray into both nostrils as needed. 2 sprays each nostril once a day, Disp: , Rfl:  .  formoterol (PERFOROMIST) 20 MCG/2ML nebulizer solution, Take 20 mcg by nebulization 4 (four) times daily., Disp: , Rfl:  .  HYDROcodone-acetaminophen (NORCO) 10-325 MG tablet, Take 1 tablet by mouth 2 (two) times daily as needed for severe pain., Disp: , Rfl:  .  inFLIXimab-abda 100 MG SOLR, Inject into the vein., Disp: , Rfl:  .  ipratropium-albuterol (DUONEB) 0.5-2.5 (3) MG/3ML SOLN, Inhale into the lungs., Disp: , Rfl:  .  methylPREDNISolone acetate (DEPO-MEDROL) 40 MG/ML injection, 40 mg once., Disp: , Rfl:  .  sildenafil (VIAGRA) 50 MG tablet, Take 1 tablet (50 mg total) by mouth as needed for erectile dysfunction., Disp: 10 tablet, Rfl: 2 .  simvastatin (ZOCOR) 80 MG tablet, Take 80 mg by mouth at  bedtime., Disp: , Rfl:  .  tamsulosin (FLOMAX) 0.4 MG CAPS, Take 0.4 mg by mouth at bedtime., Disp: , Rfl:   Past Medical History: Past Medical History:  Diagnosis Date  . Asthma   . COPD (chronic obstructive pulmonary disease) ([place])   . Crohn's disease ([place])     Tobacco Use: Social History   Tobacco Use  Smoking Status Former Smoker  . Types: Cigars  . Last attempt to quit: 09/07/1986  . Years since quitting: 31.2  Smokeless Tobacco Never Used    Labs: Recent Review Scientist, physiological    Labs for ITP Cardiac and Pulmonary Rehab Latest Ref Rng & Units 04/22/2007 04/07/2009 04/08/2009 10/09/2015   Cholestrol 0 - 200 mg/dL 212(HH) - 270 ATP III CLASSIFICATION: <200     mg/dL   Desirable 200-239  mg/dL   Borderline High >=240    mg/dL   High(H) 282(H)   LDLCALC 0 - 99 mg/dL - - 206 Total Cholesterol/HDL:CHD Risk Coronary Heart Disease Risk Table Men   Women 1/2 Average Risk   3.4   3.3 Average Risk       5.0   4.4 2 X Average Risk   9.6   7.1 3 X Average Risk  23.4   11.0 Use the calculated Patient Ratio above and the CHD Risk Table to determine the patient's CHD Risk. ATP III CLASSIFICATION (LDL): <100     mg/dL   Optimal 100-129  mg/dL  Near or Above Optimal 130-159  mg/dL   Borderline 160-189  mg/dL   High >190     mg/dL   Very High(H) 189(H)   LDLDIRECT mg/dL 146.5 - - -   HDL >39.00 mg/dL 37.3(L) - 40 56.90   Trlycerides 0.0 - 149.0 mg/dL 113 - 122 180.0(H)   TCO2 0 - 100 mmol/L - 30 - -      Capillary Blood Glucose: No results found for: GLUCAP   Pulmonary Assessment Scores: Pulmonary Assessment Scores    Row Name 10/12/17 1646 10/14/17 1423       ADL UCSD   ADL Phase  Entry  Entry    SOB Score total  -  77      CAT Score   CAT Score  -  25 Entry      mMRC Score   mMRC Score  1  -       Pulmonary Function Assessment: Pulmonary Function Assessment - 10/08/17 1049      Breath   Bilateral Breath Sounds  Clear;Decreased    Shortness of Breath   Yes;Limiting activity       Exercise Target Goals:    Exercise Program Goal: Individual exercise prescription set using results from initial 6 min walk test and THRR while considering  patient's activity barriers and safety.   Exercise Prescription Goal: Initial exercise prescription builds to 30-45 minutes a day of aerobic activity, 2-3 days per week.  Home exercise guidelines will be given to patient during program as part of exercise prescription that the participant will acknowledge.  Activity Barriers & Risk Stratification: Activity Barriers & Cardiac Risk Stratification - 10/08/17 1023      Activity Barriers & Cardiac Risk Stratification   Activity Barriers  Back Problems;Shortness of Breath;Deconditioning       6 Minute Walk: 6 Minute Walk    Row Name 10/12/17 1641 10/12/17 1646       6 Minute Walk   Phase  Initial  Initial    Distance  1600 feet  -    Walk Time  6 minutes  -    # of Rest Breaks  0  -    MPH  3.03  -    METS  3.3  -    RPE  15  -    Perceived Dyspnea   2  -    Symptoms  No  -    Resting HR  96 bpm  -    Resting BP  138/84  -    Resting Oxygen Saturation   95 %  -    Exercise Oxygen Saturation  during 6 min walk  88 %  -    Max Ex. HR  116 bpm  -    Max Ex. BP  114/84  -      Interval HR   1 Minute HR  107  -    2 Minute HR  108  -    3 Minute HR  107  -    4 Minute HR  104  -    5 Minute HR  114  -    6 Minute HR  116  -    2 Minute Post HR  101  -    Interval Heart Rate?  Yes  -      Interval Oxygen   Interval Oxygen?  Yes  -    Baseline Oxygen Saturation %  95 %  -    1  Minute Oxygen Saturation %  94 %  -    1 Minute Liters of Oxygen  0 L  -    2 Minute Oxygen Saturation %  88 %  -    2 Minute Liters of Oxygen  0 L  -    3 Minute Oxygen Saturation %  89 %  -    3 Minute Liters of Oxygen  0 L  -    4 Minute Oxygen Saturation %  89 %  -    4 Minute Liters of Oxygen  0 L  -    5 Minute Oxygen Saturation %  89 %  -    5 Minute  Liters of Oxygen  0 L  -    6 Minute Oxygen Saturation %  91 %  -    6 Minute Liters of Oxygen  0 L  -    2 Minute Post Oxygen Saturation %  96 %  -    2 Minute Post Liters of Oxygen  0 L  -       Oxygen Initial Assessment: Oxygen Initial Assessment - 10/12/17 1646      Initial 6 min Walk   Oxygen Used  None      Program Oxygen Prescription   Program Oxygen Prescription  None       Oxygen Re-Evaluation: Oxygen Re-Evaluation    Row Name 11/02/17 0814 11/26/17 1000           Program Oxygen Prescription   Program Oxygen Prescription  None  None        Home Oxygen   Home Oxygen Device  None  None      Sleep Oxygen Prescription  CPAP  CPAP      Home Exercise Oxygen Prescription  None  None      Home at Rest Exercise Oxygen Prescription  None  None         Oxygen Discharge (Final Oxygen Re-Evaluation): Oxygen Re-Evaluation - 11/26/17 1000      Program Oxygen Prescription   Program Oxygen Prescription  None      Home Oxygen   Home Oxygen Device  None    Sleep Oxygen Prescription  CPAP    Home Exercise Oxygen Prescription  None    Home at Rest Exercise Oxygen Prescription  None       Initial Exercise Prescription: Initial Exercise Prescription - 10/12/17 1600      Date of Initial Exercise RX and Referring Provider   Date  10/12/17    Referring Provider  Dr. Nelda Marseille      Bike   Level  1    Minutes  17      NuStep   Level  2    SPM  80    Minutes  17    METs  1.5      Track   Laps  10    Minutes  17      Prescription Details   Frequency (times per week)  2    Duration  Progress to 45 minutes of aerobic exercise without signs/symptoms of physical distress      Intensity   THRR 40-80% of Max Heartrate  52-124    Ratings of Perceived Exertion  11-13    Perceived Dyspnea  0-4      Progression   Progression  Continue progressive overload as per policy without signs/symptoms or physical distress.      Horticulturist, commercial Prescription  Yes     Weight  blue bands    Reps  10-15       Perform Capillary Blood Glucose checks as needed.  Exercise Prescription Changes: Exercise Prescription Changes    Row Name 10/26/17 1200 11/02/17 0731 11/09/17 1200 11/23/17 1200       Response to Exercise   Blood Pressure (Admit)  118/72  -  124/76  104/80    Blood Pressure (Exercise)  160/56  -  120/70  122/78    Blood Pressure (Exit)  116/82  -  114/76  120/80    Heart Rate (Admit)  84 bpm  -  84 bpm  84 bpm    Heart Rate (Exercise)  104 bpm  -  113 bpm  113 bpm    Heart Rate (Exit)  78 bpm  -  69 bpm  85 bpm    Oxygen Saturation (Admit)  96 %  -  96 %  97 %    Oxygen Saturation (Exercise)  93 %  -  95 %  93 %    Oxygen Saturation (Exit)  97 %  -  95 %  97 %    Rating of Perceived Exertion (Exercise)  12  -  13  13    Perceived Dyspnea (Exercise)  3  -  2  1    Duration  Progress to 45 minutes of aerobic exercise without signs/symptoms of physical distress  -  Progress to 45 minutes of aerobic exercise without signs/symptoms of physical distress  Progress to 45 minutes of aerobic exercise without signs/symptoms of physical distress    Intensity  THRR unchanged  -  THRR unchanged  THRR unchanged      Progression   Progression  Continue to progress workloads to maintain intensity without signs/symptoms of physical distress.  -  Continue to progress workloads to maintain intensity without signs/symptoms of physical distress.  Continue to progress workloads to maintain intensity without signs/symptoms of physical distress.      Resistance Training   Training Prescription  Yes  -  Yes  Yes    Weight  blue bands  -  blue bands  blue bands    Reps  10-15  -  10-15  10-15    Time  10 Minutes  -  10 Minutes  10 Minutes      Treadmill   MPH  1.3  -  2.3  1.2    Grade  2  -  3  4    Minutes  17  -  17  17      Bike   Level  1  -  1  1.4    Minutes  17  -  17  17      NuStep   Level  3  -  4  5    SPM  80  -  80  80    Minutes  17  -   17  17    METs  2.3  -  3  2.7      Home Exercise Plan   Plans to continue exercise at  -  Home (comment) has treadmill at home  -  -    Frequency  -  Add 3 additional days to program exercise sessions.  -  -       Exercise Comments: Exercise Comments    Row Name 11/04/17 0730           Exercise  Comments  Home exercise completed          Exercise Goals and Review: Exercise Goals    Row Name 10/08/17 1024             Exercise Goals   Increase Physical Activity  Yes       Intervention  Provide advice, education, support and counseling about physical activity/exercise needs.;Develop an individualized exercise prescription for aerobic and resistive training based on initial evaluation findings, risk stratification, comorbidities and participant's personal goals.       Expected Outcomes  Short Term: Attend rehab on a regular basis to increase amount of physical activity.;Long Term: Add in home exercise to make exercise part of routine and to increase amount of physical activity.;Long Term: Exercising regularly at least 3-5 days a week.       Increase Strength and Stamina  Yes       Intervention  Provide advice, education, support and counseling about physical activity/exercise needs.;Develop an individualized exercise prescription for aerobic and resistive training based on initial evaluation findings, risk stratification, comorbidities and participant's personal goals.       Expected Outcomes  Short Term: Increase workloads from initial exercise prescription for resistance, speed, and METs.;Short Term: Perform resistance training exercises routinely during rehab and add in resistance training at home;Long Term: Improve cardiorespiratory fitness, muscular endurance and strength as measured by increased METs and functional capacity (6MWT)       Able to understand and use rate of perceived exertion (RPE) scale  Yes       Intervention  Provide education and explanation on how to use RPE  scale       Expected Outcomes  Short Term: Able to use RPE daily in rehab to express subjective intensity level;Long Term:  Able to use RPE to guide intensity level when exercising independently       Able to understand and use Dyspnea scale  Yes       Intervention  Provide education and explanation on how to use Dyspnea scale       Expected Outcomes  Short Term: Able to use Dyspnea scale daily in rehab to express subjective sense of shortness of breath during exertion;Long Term: Able to use Dyspnea scale to guide intensity level when exercising independently       Knowledge and understanding of Target Heart Rate Range (THRR)  Yes       Intervention  Provide education and explanation of THRR including how the numbers were predicted and where they are located for reference       Expected Outcomes  Short Term: Able to state/look up THRR;Short Term: Able to use daily as guideline for intensity in rehab;Long Term: Able to use THRR to govern intensity when exercising independently       Understanding of Exercise Prescription  Yes       Intervention  Provide education, explanation, and written materials on patient's individual exercise prescription       Expected Outcomes  Short Term: Able to explain program exercise prescription;Long Term: Able to explain home exercise prescription to exercise independently          Exercise Goals Re-Evaluation : Exercise Goals Re-Evaluation    Row Name 11/02/17 0814 11/26/17 1001           Exercise Goal Re-Evaluation   Exercise Goals Review  Able to understand and use Dyspnea scale;Increase Strength and Stamina;Increase Physical Activity;Able to understand and use rate of perceived exertion (RPE) scale;Knowledge and understanding of  Target Heart Rate Range (THRR);Understanding of Exercise Prescription  Able to understand and use Dyspnea scale;Increase Strength and Stamina;Increase Physical Activity;Able to understand and use rate of perceived exertion (RPE)  scale;Knowledge and understanding of Target Heart Rate Range (THRR);Understanding of Exercise Prescription      Comments  Patient has only attended 4 rehab sessions. Progressing welll-open to workload increases. Attendance has been consistent. Will cont. to monitor and progress as able.   Patient is progressing well in Pulmonary Rehab. Motivated to increase workloads. Exercising at home. MET level places him in a moderate level. Will cont. to monitor and motivate as tolerated.       Expected Outcomes  Through exercise at rehab and at home, the patient will decrease shortness of breath with daily activities and feel confident in carrying out an exercise regime at home.   Through exercise at rehab and at home, the patient will decrease shortness of breath with daily activities and feel confident in carrying out an exercise regime at home.          Discharge Exercise Prescription (Final Exercise Prescription Changes): Exercise Prescription Changes - 11/23/17 1200      Response to Exercise   Blood Pressure (Admit)  104/80    Blood Pressure (Exercise)  122/78    Blood Pressure (Exit)  120/80    Heart Rate (Admit)  84 bpm    Heart Rate (Exercise)  113 bpm    Heart Rate (Exit)  85 bpm    Oxygen Saturation (Admit)  97 %    Oxygen Saturation (Exercise)  93 %    Oxygen Saturation (Exit)  97 %    Rating of Perceived Exertion (Exercise)  13    Perceived Dyspnea (Exercise)  1    Duration  Progress to 45 minutes of aerobic exercise without signs/symptoms of physical distress    Intensity  THRR unchanged      Progression   Progression  Continue to progress workloads to maintain intensity without signs/symptoms of physical distress.      Resistance Training   Training Prescription  Yes    Weight  blue bands    Reps  10-15    Time  10 Minutes      Treadmill   MPH  1.2    Grade  4    Minutes  17      Bike   Level  1.4    Minutes  17      NuStep   Level  5    SPM  80    Minutes  17    METs   2.7       Nutrition:  Target Goals: Understanding of nutrition guidelines, daily intake of sodium <1562m, cholesterol <2013m calories 30% from fat and 7% or less from saturated fats, daily to have 5 or more servings of fruits and vegetables.  Biometrics: Pre Biometrics - 10/08/17 1058      Pre Biometrics   Grip Strength  31 kg        Nutrition Therapy Plan and Nutrition Goals: Nutrition Therapy & Goals - 10/28/17 1230      Nutrition Therapy   Diet  General, healthful      Personal Nutrition Goals   Nutrition Goal  Pt to decrease portion size of meat at meals to ~ 3ounces    Personal Goal #2  Pt to add a meatless meal at least 1-2 times/week    Personal Goal #3  Pt to increase consumption of whole grains  consumed to at least 1 serving/day, working up to 50% of grains consumed as whole grains      Intervention Plan   Intervention  Prescribe, educate and counsel regarding individualized specific dietary modifications aiming towards targeted core components such as weight, hypertension, lipid management, diabetes, heart failure and other comorbidities.    Expected Outcomes  Short Term Goal: Understand basic principles of dietary content, such as calories, fat, sodium, cholesterol and nutrients.;Long Term Goal: Adherence to prescribed nutrition plan.       Nutrition Assessments: Nutrition Assessments - 10/28/17 1221      Rate Your Plate Scores   Pre Score  47       Nutrition Goals Re-Evaluation:   Nutrition Goals Discharge (Final Nutrition Goals Re-Evaluation):   Psychosocial: Target Goals: Acknowledge presence or absence of significant depression and/or stress, maximize coping skills, provide positive support system. Participant is able to verbalize types and ability to use techniques and skills needed for reducing stress and depression.  Initial Review & Psychosocial Screening: Initial Psych Review & Screening - 10/08/17 1050      Initial Review   Current issues  with  Current Stress Concerns lovingly cares for his wife with chronic illness.     Source of Stress Concerns  Family    Comments  intermittent mild caregiver stress however patients sister-in-law willingly steps in to care for patient's wife to allow patient to have some "me time"      [place]?  Yes church family and friends      Barriers   Psychosocial barriers to participate in program  The patient should benefit from training in stress management and relaxation.      Screening Interventions   Interventions  Encouraged to exercise;Provide feedback about the scores to participant    Expected Outcomes  Short Term goal: Utilizing psychosocial counselor, staff and physician to assist with identification of specific Stressors or current issues interfering with healing process. Setting desired goal for each stressor or current issue identified.;Long Term Goal: Stressors or current issues are controlled or eliminated.       Quality of Life Scores:  Scores of 19 and below usually indicate a poorer quality of life in these areas.  A difference of  2-3 points is a clinically meaningful difference.  A difference of 2-3 points in the total score of the Quality of Life Index has been associated with significant improvement in overall quality of life, self-image, physical symptoms, and general health in studies assessing change in quality of life.   PHQ-9: Recent Review Flowsheet Data    Depression screen [facility] 2/9 10/08/2017 10/09/2015 07/05/2014   Decreased Interest 0 0 0   Down, Depressed, Hopeless 0 0 0   PHQ - 2 Score 0 0 0     Interpretation of Total Score  Total Score Depression Severity:  1-4 = Minimal depression, 5-9 = Mild depression, 10-14 = Moderate depression, 15-19 = Moderately severe depression, 20-27 = Severe depression   Psychosocial Evaluation and Intervention: Psychosocial Evaluation - 11/01/17 1614      Psychosocial Evaluation & Interventions    Comments  Pt contributes to discussions during the education classes and interacts well with fellow participants and staff.  Pt participates in his hobby of fishing       Psychosocial Re-Evaluation: Psychosocial Re-Evaluation    [place] Name 11/01/17 1611 11/29/17 1431           Psychosocial Re-Evaluation   Current issues with  Current Stress Concerns  Current Stress Concerns      Expected Outcomes  Pt will reach out to other family members when he has caregiver fatigue/stress.  Pt will utilize community resources that offer respite care.  Pt will employ strategies to maintain his health both physically and mentally.  Pt will reach out to other family members when he has caregiver fatigue/stress.  Pt will utilize community resources that offer respite care.  Pt will employ strategies to maintain his health both physically and mentally.      Interventions  Stress management education;Encouraged to attend Pulmonary Rehabilitation for the exercise;Relaxation education  Encouraged to attend Pulmonary Rehabilitation for the exercise;Stress management education;Relaxation education      Continue Psychosocial Services   Follow up required by staff  Follow up required by staff      Comments  intermittent mild caregiver stress however patients sister-in-law willingly steps in to care for patient's wife to allow patient to have some "me time"  intermittent mild caregiver stress however patients sister-in-law willingly steps in to care for patient's wife to allow patient to have some "me time"        Initial Review   Source of Stress Concerns  Family  Family         Psychosocial Discharge (Final Psychosocial Re-Evaluation): Psychosocial Re-Evaluation - 11/29/17 1431      Psychosocial Re-Evaluation   Current issues with  Current Stress Concerns    Expected Outcomes  Pt will reach out to other family members when he has caregiver fatigue/stress.  Pt will utilize community resources that offer respite  care.  Pt will employ strategies to maintain his health both physically and mentally.    Interventions  Encouraged to attend Pulmonary Rehabilitation for the exercise;Stress management education;Relaxation education    Continue Psychosocial Services   Follow up required by staff    Comments  intermittent mild caregiver stress however patients sister-in-law willingly steps in to care for patient's wife to allow patient to have some "me time"      Initial Review   Source of Stress Concerns  Family        Education: Education Goals: Education classes will be provided on a weekly basis, covering required topics. Participant will state understanding/return demonstration of topics presented.  Learning Barriers/Preferences: Learning Barriers/Preferences - 10/08/17 1049      Learning Barriers/Preferences   Learning Barriers  None    Learning Preferences  Skilled Demonstration;Written Material       Education Topics: How Lungs Work and Diseases: - Discuss the anatomy of the lungs and diseases that can affect the lungs, such as COPD.   Exercise: -Discuss the importance of exercise, FITT principles of exercise, normal and abnormal responses to exercise, and how to exercise safely.   Environmental Irritants: -Discuss types of environmental irritants and how to limit exposure to environmental irritants.   Meds/Inhalers and oxygen: - Discuss respiratory medications, definition of an inhaler and oxygen, and the proper way to use an inhaler and oxygen.   Energy Saving Techniques: - Discuss methods to conserve energy and decrease shortness of breath when performing activities of daily living.    Bronchial Hygiene / Breathing Techniques: - Discuss breathing mechanics, pursed-lip breathing technique,  proper posture, effective ways to clear airways, and other functional breathing techniques   Cleaning Equipment: - Provides group verbal and written instruction about the health risks of  elevated stress, cause of high stress, and healthy ways to reduce stress.   Nutrition I: Fats: -  Discuss the types of cholesterol, what cholesterol does to the body, and how cholesterol levels can be controlled.   Nutrition II: Labels: -Discuss the different components of food labels and how to read food labels.   Respiratory Infections: - Discuss the signs and symptoms of respiratory infections, ways to prevent respiratory infections, and the importance of seeking medical treatment when having a respiratory infection.   Stress I: Signs and Symptoms: - Discuss the causes of stress, how stress may lead to anxiety and depression, and ways to limit stress.   Stress II: Relaxation: -Discuss relaxation techniques to limit stress.   Oxygen for Home/Travel: - Discuss how to prepare for travel when on oxygen and proper ways to transport and store oxygen to ensure safety.   Knowledge Questionnaire Score: Knowledge Questionnaire Score - 10/14/17 1421      Knowledge Questionnaire Score   Pre Score  9/18       Core Components/Risk Factors/Patient Goals at Admission: Personal Goals and Risk Factors at Admission - 10/08/17 1050      Core Components/Risk Factors/Patient Goals on Admission    Weight Management  Yes    Intervention  Weight Management/Obesity: Establish reasonable short term and long term weight goals.;Obesity: Provide education and appropriate resources to help participant work on and attain dietary goals.;Weight Management: Provide education and appropriate resources to help participant work on and attain dietary goals.;Weight Management: Develop a combined nutrition and exercise program designed to reach desired caloric intake, while maintaining appropriate intake of nutrient and fiber, sodium and fats, and appropriate energy expenditure required for the weight goal.    Expected Outcomes  Short Term: Continue to assess and modify interventions until short term weight is  achieved;Long Term: Adherence to nutrition and physical activity/exercise program aimed toward attainment of established weight goal;Weight Maintenance: Understanding of the daily nutrition guidelines, which includes 25-35% calories from fat, 7% or less cal from saturated fats, less than 271m cholesterol, less than 1.5gm of sodium, & 5 or more servings of fruits and vegetables daily;Weight Loss: Understanding of general recommendations for a balanced deficit meal plan, which promotes 1-2 lb weight loss per week and includes a negative energy balance of [phone] kcal/d;Understanding recommendations for meals to include 15-35% energy as protein, 25-35% energy from fat, 35-60% energy from carbohydrates, less than 2089mof dietary cholesterol, 20-35 gm of total fiber daily;Understanding of distribution of calorie intake throughout the day with the consumption of 4-5 meals/snacks    Improve shortness of breath with ADL's  Yes    Intervention  Provide education, individualized exercise plan and daily activity instruction to help decrease symptoms of SOB with activities of daily living.    Expected Outcomes  Short Term: Improve cardiorespiratory fitness to achieve a reduction of symptoms when performing ADLs;Long Term: Be able to perform more ADLs without symptoms or delay the onset of symptoms       Core Components/Risk Factors/Patient Goals Review:  Goals and Risk Factor Review    Row Name 11/01/17 1601 11/01/17 1604 11/29/17 1429         Core Components/Risk Factors/Patient Goals Review   Personal Goals Review  Weight Management/Obesity;Improve shortness of breath with ADL's  -  Weight Management/Obesity;Improve shortness of breath with ADL's     Review  Pt is off to a great start with his participation in pulmonary rehab.  Pt desires   Pt is off to a great start with his participation in pulmonary rehab.  Pt desires to losse weight. During his two  week participation in pulmonary rehab, he has lost 1.4  kg.  Pt reports he continues to have shortness of breath but feels it is managed better with PLB and diaphragmatic breaathing. Pt maintains o2 sat in the mid 90's at rest and mid to low 90's on exertion  has lost 3 kg, progressing well, walking on treadmill @ 2.5 mph and 4% grade, level 5 on nustep, and level 1.4 on bicycle,      Expected Outcomes  -  Pt will achieve his desired weight loss and report improved management of his shortness of breath.  Pt will achieve his desired weight loss and report improved management of his shortness of breath.        Core Components/Risk Factors/Patient Goals at Discharge (Final Review):  Goals and Risk Factor Review - 11/29/17 1429      Core Components/Risk Factors/Patient Goals Review   Personal Goals Review  Weight Management/Obesity;Improve shortness of breath with ADL's    Review  has lost 3 kg, progressing well, walking on treadmill @ 2.5 mph and 4% grade, level 5 on nustep, and level 1.4 on bicycle,     Expected Outcomes  Pt will achieve his desired weight loss and report improved management of his shortness of breath.       ITP Comments: ITP Comments    Row Name 11/03/17 1042           ITP Comments  Dr. Jennet Maduro, Medical Director          Comments: ITP REVIEW Pt is making expected progress toward pulmonary rehab goals after completing 13 sessions. Recommend continued exercise, life style modification, education, and utilization of breathing techniques to increase stamina and strength and decrease shortness of breath with exertion.

## 2017-12-07 ENCOUNTER — Encounter (HOSPITAL_COMMUNITY)
Admission: RE | Admit: 2017-12-07 | Discharge: 2017-12-07 | Disposition: A | Discharge status: Home / Self Care | Payer: No Typology Code available for payment source | Source: Ambulatory Visit | Attending: Internal Medicine | Admitting: Internal Medicine

## 2017-12-07 VITALS — Wt 226.6 lb

## 2017-12-07 DIAGNOSIS — J449 Chronic obstructive pulmonary disease, unspecified: Secondary | ICD-10-CM

## 2017-12-07 NOTE — Progress Notes (Signed)
Daily Session Note  Patient Details  Name: Kevin Yang MRN: [id number] Date of Birth: [birth year] Referring Provider:     Pulmonary Rehab Walk Test from 10/12/2017 in [place]  Referring Provider  Dr. Nelda Marseille      Encounter Date: 12/07/2017  Check In: Session Check In - 12/07/17 1030      Check-In   Location  [facility]    Staff Present  Rosebud Poles, RN, BSN;Carlette Wilber Oliphant, RN, BSN;Lisa Ysidro Evert, Felipe Drone, RN, [facility]    Supervising physician immediately available to respond to emergencies  [facility] immediately available    Physician(s)  Dr. Verlon Au    Medication changes reported      No    Fall or balance concerns reported     No    Tobacco Cessation  No Change    Warm-up and Cool-down  Performed as group-led instruction    Resistance Training Performed  Yes    VAD Patient?  No    PAD/SET Patient?  No      Pain Assessment   Currently in Pain?  No/denies    Multiple Pain Sites  No       Capillary Blood Glucose: No results found for this or any previous visit (from the past 24 hour(s)).  Exercise Prescription Changes - 12/07/17 1300      Response to Exercise   Blood Pressure (Admit)  118/74    Blood Pressure (Exercise)  140/80    Blood Pressure (Exit)  120/80    Heart Rate (Admit)  85 bpm    Heart Rate (Exercise)  108 bpm    Heart Rate (Exit)  82 bpm    Oxygen Saturation (Admit)  96 %    Oxygen Saturation (Exercise)  94 %    Oxygen Saturation (Exit)  97 %    Rating of Perceived Exertion (Exercise)  14    Perceived Dyspnea (Exercise)  2    Duration  Progress to 45 minutes of aerobic exercise without signs/symptoms of physical distress    Intensity  THRR unchanged      Progression   Progression  Continue to progress workloads to maintain intensity without signs/symptoms of physical distress.      Resistance Training   Training Prescription  Yes    Weight  blue bands    Reps  10-15    Time   10 Minutes      Interval Training   Interval Training  No      Treadmill   MPH  2.5    Grade  4    Minutes  17      Bike   Level  1.4    Minutes  17      NuStep   Level  5    SPM  80    Minutes  17    METs  4.6       Social History   Tobacco Use  Smoking Status Former Smoker  . Types: Cigars  . Last attempt to quit: 09/07/1986  . Years since quitting: 31.2  Smokeless Tobacco Never Used    Goals Met:  Exercise tolerated well Strength training completed today  Goals Unmet:  Not Applicable  Comments: Service time is from 1030 to 1210    Dr. Rush Farmer is Medical Director for Pulmonary Rehab at [facility].

## 2017-12-09 ENCOUNTER — Encounter (HOSPITAL_COMMUNITY)
Admission: RE | Admit: 2017-12-09 | Discharge: 2017-12-09 | Disposition: A | Discharge status: Home / Self Care | Payer: No Typology Code available for payment source | Source: Ambulatory Visit | Attending: Internal Medicine | Admitting: Internal Medicine

## 2017-12-09 VITALS — Wt 228.2 lb

## 2017-12-09 DIAGNOSIS — J449 Chronic obstructive pulmonary disease, unspecified: Secondary | ICD-10-CM | POA: Diagnosis not present

## 2017-12-09 NOTE — Progress Notes (Signed)
Daily Session Note  Patient Details  Name: Kevin Yang MRN: [id number] Date of Birth: 11-12-1950 Referring Provider:     Pulmonary Rehab Walk Test from 10/12/2017 in [place]  Referring Provider  Dr. Nelda Marseille      Encounter Date: 12/09/2017  Check In: Session Check In - 12/09/17 1030      Check-In   Location  [facility]    Staff Present  Rosebud Poles, RN, BSN;Carlette Wilber Oliphant, RN, BSN;Lisa Ysidro Evert, Felipe Drone, RN, [facility]    Supervising physician immediately available to respond to emergencies  [facility] immediately available    Physician(s)  Dr. Broadus John    Medication changes reported      No    Fall or balance concerns reported     No    Tobacco Cessation  No Change    Warm-up and Cool-down  Performed as group-led instruction    Resistance Training Performed  Yes    VAD Patient?  No    PAD/SET Patient?  No      Pain Assessment   Currently in Pain?  No/denies    Multiple Pain Sites  No       Capillary Blood Glucose: No results found for this or any previous visit (from the past 24 hour(s)).    Social History   Tobacco Use  Smoking Status Former Smoker  . Types: Cigars  . Last attempt to quit: 09/07/1986  . Years since quitting: 31.2  Smokeless Tobacco Never Used    Goals Met:  Exercise tolerated well Strength training completed today  Goals Unmet:  Not Applicable  Comments: Service time is from 1030 to 1225.    Dr. Rush Farmer is Medical Director for Pulmonary Rehab at [facility].

## 2017-12-14 ENCOUNTER — Encounter (HOSPITAL_COMMUNITY)
Admission: RE | Admit: 2017-12-14 | Discharge: 2017-12-14 | Disposition: A | Discharge status: Home / Self Care | Payer: No Typology Code available for payment source | Source: Ambulatory Visit | Attending: Internal Medicine | Admitting: Internal Medicine

## 2017-12-14 VITALS — Wt 226.0 lb

## 2017-12-14 DIAGNOSIS — J449 Chronic obstructive pulmonary disease, unspecified: Secondary | ICD-10-CM | POA: Diagnosis not present

## 2017-12-14 NOTE — Progress Notes (Signed)
Daily Session Note  Patient Details  Name: BLAYDE BACIGALUPI MRN: [id number] Date of Birth: December 22, 1950 Referring Provider:     Pulmonary Rehab Walk Test from 10/12/2017 in [place]  Referring Provider  Dr. Nelda Marseille      Encounter Date: 12/14/2017  Check In: Session Check In - 12/14/17 1119      Check-In   Location  [facility]    Staff Present  Rosebud Poles, RN, BSN;Carlette Carlton, RN, BSN;Molly DiVincenzo, MS, ACSM RCEP, Exercise Physiologist;Lisa Ysidro Evert, Felipe Drone, RN, [facility]    Supervising physician immediately available to respond to emergencies  [facility] immediately available    Physician(s)  Dr. Broadus John    Medication changes reported      No    Fall or balance concerns reported     No    Tobacco Cessation  No Change    Warm-up and Cool-down  Performed as group-led instruction    Resistance Training Performed  Yes    VAD Patient?  No    PAD/SET Patient?  No      Pain Assessment   Currently in Pain?  No/denies    Multiple Pain Sites  No       Capillary Blood Glucose: No results found for this or any previous visit (from the past 24 hour(s)).    Social History   Tobacco Use  Smoking Status Former Smoker  . Types: Cigars  . Last attempt to quit: 09/07/1986  . Years since quitting: 31.2  Smokeless Tobacco Never Used    Goals Met:  Exercise tolerated well Strength training completed today  Goals Unmet:  Not Applicable  Comments: Service time is from 1030 to 1205    Dr. Rush Farmer is Medical Director for Pulmonary Rehab at [facility].

## 2017-12-16 ENCOUNTER — Encounter (HOSPITAL_COMMUNITY)
Admission: RE | Admit: 2017-12-16 | Discharge: 2017-12-16 | Disposition: A | Discharge status: Home / Self Care | Payer: No Typology Code available for payment source | Source: Ambulatory Visit | Attending: Internal Medicine | Admitting: Internal Medicine

## 2017-12-16 VITALS — Wt 225.5 lb

## 2017-12-16 DIAGNOSIS — J449 Chronic obstructive pulmonary disease, unspecified: Secondary | ICD-10-CM | POA: Diagnosis not present

## 2017-12-16 NOTE — Progress Notes (Signed)
Daily Session Note  Patient Details  Name: Kevin Yang MRN: [id number] Date of Birth: 02/27/1951 Referring Provider:     Pulmonary Rehab Walk Test from 10/12/2017 in [place]  Referring Provider  Dr. Nelda Marseille      Encounter Date: 12/16/2017  Check In: Session Check In - 12/16/17 1030      Check-In   Location  [facility]    Staff Present  Rosebud Poles, RN, BSN;Carlette Carlton, RN, [facility] DiVincenzo, MS, ACSM RCEP, Exercise Physiologist;Lisa Ysidro Evert, Felipe Drone, RN, [facility]    Supervising physician immediately available to respond to emergencies  [facility] immediately available    Physician(s)  Dr. Denton Brick    Medication changes reported      No    Fall or balance concerns reported     No    Tobacco Cessation  No Change    Warm-up and Cool-down  Performed as group-led instruction    Resistance Training Performed  Yes    VAD Patient?  No    PAD/SET Patient?  No      Pain Assessment   Currently in Pain?  No/denies    Multiple Pain Sites  No       Capillary Blood Glucose: No results found for this or any previous visit (from the past 24 hour(s)).    Social History   Tobacco Use  Smoking Status Former Smoker  . Types: Cigars  . Last attempt to quit: 09/07/1986  . Years since quitting: 31.2  Smokeless Tobacco Never Used    Goals Met:  Exercise tolerated well Strength training completed today  Goals Unmet:  Not Applicable  Comments: Service time is from 1030 to 1210    Dr. Rush Farmer is Medical Director for Pulmonary Rehab at [facility].

## 2017-12-20 NOTE — Progress Notes (Signed)
Pulmonary Individual Treatment Plan  Patient Details  Name: Kevin Yang MRN: [id number] Date of Birth: 1951/02/23 Referring Provider:     Pulmonary Rehab Walk Test from 10/12/2017 in [place]  Referring Provider  Dr. Nelda Marseille      Initial Encounter Date:    Pulmonary Rehab Walk Test from 10/12/2017 in [place]  Date  10/12/17      Visit Diagnosis: Chronic obstructive pulmonary disease, unspecified COPD type ([place])  Patient's Home Medications on Admission:   Current Outpatient Medications:  .  albuterol (PROVENTIL HFA;VENTOLIN HFA) 108 (90 BASE) MCG/ACT inhaler, Inhale 2 puffs into the lungs every 6 (six) hours as needed for wheezing or shortness of breath. , Disp: , Rfl:  .  budesonide-formoterol (SYMBICORT) 160-4.5 MCG/ACT inhaler, Inhale 2 puffs into the lungs 2 (two) times daily., Disp: , Rfl:  .  cetirizine (ZYRTEC) 10 MG tablet, Take 10 mg by mouth daily., Disp: , Rfl:  .  diclofenac (VOLTAREN) 75 MG EC tablet, Take 75 mg by mouth 2 (two) times daily., Disp: , Rfl:  .  fluticasone (FLONASE) 50 MCG/ACT nasal spray, Place 1 spray into both nostrils as needed. 2 sprays each nostril once a day, Disp: , Rfl:  .  formoterol (PERFOROMIST) 20 MCG/2ML nebulizer solution, Take 20 mcg by nebulization 4 (four) times daily., Disp: , Rfl:  .  HYDROcodone-acetaminophen (NORCO) 10-325 MG tablet, Take 1 tablet by mouth 2 (two) times daily as needed for severe pain., Disp: , Rfl:  .  inFLIXimab-abda 100 MG SOLR, Inject into the vein., Disp: , Rfl:  .  ipratropium-albuterol (DUONEB) 0.5-2.5 (3) MG/3ML SOLN, Inhale into the lungs., Disp: , Rfl:  .  methylPREDNISolone acetate (DEPO-MEDROL) 40 MG/ML injection, 40 mg once., Disp: , Rfl:  .  sildenafil (VIAGRA) 50 MG tablet, Take 1 tablet (50 mg total) by mouth as needed for erectile dysfunction., Disp: 10 tablet, Rfl: 2 .  simvastatin (ZOCOR) 80 MG tablet, Take 80 mg by mouth at  bedtime., Disp: , Rfl:  .  tamsulosin (FLOMAX) 0.4 MG CAPS, Take 0.4 mg by mouth at bedtime., Disp: , Rfl:   Past Medical History: Past Medical History:  Diagnosis Date  . Asthma   . COPD (chronic obstructive pulmonary disease) ([place])   . Crohn's disease ([place])     Tobacco Use: Social History   Tobacco Use  Smoking Status Former Smoker  . Types: Cigars  . Last attempt to quit: 09/07/1986  . Years since quitting: 31.3  Smokeless Tobacco Never Used    Labs: Recent Chemical engineer    Labs for ITP Cardiac and Pulmonary Rehab Latest Ref Rng & Units 04/22/2007 04/07/2009 04/08/2009 10/09/2015   Cholestrol 0 - 200 mg/dL 212(HH) - 270 ATP III CLASSIFICATION: <200     mg/dL   Desirable 200-239  mg/dL   Borderline High >=240    mg/dL   High(H) 282(H)   LDLCALC 0 - 99 mg/dL - - 206 Total Cholesterol/HDL:CHD Risk Coronary Heart Disease Risk Table Men   Women 1/2 Average Risk   3.4   3.3 Average Risk       5.0   4.4 2 X Average Risk   9.6   7.1 3 X Average Risk  23.4   11.0 Use the calculated Patient Ratio above and the CHD Risk Table to determine the patient's CHD Risk. ATP III CLASSIFICATION (LDL): <100     mg/dL   Optimal 100-129  mg/dL  Near or Above Optimal 130-159  mg/dL   Borderline 160-189  mg/dL   High >190     mg/dL   Very High(H) 189(H)   LDLDIRECT mg/dL 146.5 - - -   HDL >39.00 mg/dL 37.3(L) - 40 56.90   Trlycerides 0.0 - 149.0 mg/dL 113 - 122 180.0(H)   TCO2 0 - 100 mmol/L - 30 - -      Capillary Blood Glucose: No results found for: GLUCAP   Pulmonary Assessment Scores: Pulmonary Assessment Scores    Row Name 10/12/17 1646         ADL UCSD   ADL Phase  Entry       mMRC Score   mMRC Score  1        Pulmonary Function Assessment: Pulmonary Function Assessment - 10/08/17 1049      Breath   Bilateral Breath Sounds  Clear;Decreased    Shortness of Breath  Yes;Limiting activity       Exercise Target Goals:    Exercise Program  Goal: Individual exercise prescription set using results from initial 6 min walk test and THRR while considering  patient's activity barriers and safety.    Exercise Prescription Goal: Initial exercise prescription builds to 30-45 minutes a day of aerobic activity, 2-3 days per week.  Home exercise guidelines will be given to patient during program as part of exercise prescription that the participant will acknowledge.  Activity Barriers & Risk Stratification: Activity Barriers & Cardiac Risk Stratification - 10/08/17 1023      Activity Barriers & Cardiac Risk Stratification   Activity Barriers  Back Problems;Shortness of Breath;Deconditioning       6 Minute Walk: 6 Minute Walk    Row Name 10/12/17 1641 10/12/17 1646       6 Minute Walk   Phase  Initial  Initial    Distance  1600 feet  -    Walk Time  6 minutes  -    # of Rest Breaks  0  -    MPH  3.03  -    METS  3.3  -    RPE  15  -    Perceived Dyspnea   2  -    Symptoms  No  -    Resting HR  96 bpm  -    Resting BP  138/84  -    Resting Oxygen Saturation   95 %  -    Exercise Oxygen Saturation  during 6 min walk  88 %  -    Max Ex. HR  116 bpm  -    Max Ex. BP  114/84  -      Interval HR   1 Minute HR  107  -    2 Minute HR  108  -    3 Minute HR  107  -    4 Minute HR  104  -    5 Minute HR  114  -    6 Minute HR  116  -    2 Minute Post HR  101  -    Interval Heart Rate?  Yes  -      Interval Oxygen   Interval Oxygen?  Yes  -    Baseline Oxygen Saturation %  95 %  -    1 Minute Oxygen Saturation %  94 %  -    1 Minute Liters of Oxygen  0 L  -    2 Minute  Oxygen Saturation %  88 %  -    2 Minute Liters of Oxygen  0 L  -    3 Minute Oxygen Saturation %  89 %  -    3 Minute Liters of Oxygen  0 L  -    4 Minute Oxygen Saturation %  89 %  -    4 Minute Liters of Oxygen  0 L  -    5 Minute Oxygen Saturation %  89 %  -    5 Minute Liters of Oxygen  0 L  -    6 Minute Oxygen Saturation %  91 %  -    6 Minute  Liters of Oxygen  0 L  -    2 Minute Post Oxygen Saturation %  96 %  -    2 Minute Post Liters of Oxygen  0 L  -       Oxygen Initial Assessment: Oxygen Initial Assessment - 10/12/17 1646      Initial 6 min Walk   Oxygen Used  None      Program Oxygen Prescription   Program Oxygen Prescription  None       Oxygen Re-Evaluation: Oxygen Re-Evaluation    Row Name 11/02/17 0814 11/26/17 1000 12/20/17 0910         Program Oxygen Prescription   Program Oxygen Prescription  None  None  None       Home Oxygen   Home Oxygen Device  None  None  None     Sleep Oxygen Prescription  CPAP  CPAP  CPAP     Home Exercise Oxygen Prescription  None  None  None     Home at Rest Exercise Oxygen Prescription  None  None  None        Oxygen Discharge (Final Oxygen Re-Evaluation): Oxygen Re-Evaluation - 12/20/17 0910      Program Oxygen Prescription   Program Oxygen Prescription  None      Home Oxygen   Home Oxygen Device  None    Sleep Oxygen Prescription  CPAP    Home Exercise Oxygen Prescription  None    Home at Rest Exercise Oxygen Prescription  None       Initial Exercise Prescription: Initial Exercise Prescription - 10/12/17 1600      Date of Initial Exercise RX and Referring Provider   Date  10/12/17    Referring Provider  Dr. Nelda Marseille      Bike   Level  1    Minutes  17      NuStep   Level  2    SPM  80    Minutes  17    METs  1.5      Track   Laps  10    Minutes  17      Prescription Details   Frequency (times per week)  2    Duration  Progress to 45 minutes of aerobic exercise without signs/symptoms of physical distress      Intensity   THRR 40-80% of Max Heartrate  52-124    Ratings of Perceived Exertion  11-13    Perceived Dyspnea  0-4      Progression   Progression  Continue progressive overload as per policy without signs/symptoms or physical distress.      Resistance Training   Training Prescription  Yes    Weight  blue bands    Reps  10-15        Perform  Capillary Blood Glucose checks as needed.  Exercise Prescription Changes: Exercise Prescription Changes    Row Name 10/26/17 1200 11/02/17 0731 11/09/17 1200 11/23/17 1200 12/07/17 1300     Response to Exercise   Blood Pressure (Admit)  118/72  -  124/76  104/80  118/74   Blood Pressure (Exercise)  160/56  -  120/70  122/78  140/80   Blood Pressure (Exit)  116/82  -  114/76  120/80  120/80   Heart Rate (Admit)  84 bpm  -  84 bpm  84 bpm  85 bpm   Heart Rate (Exercise)  104 bpm  -  113 bpm  113 bpm  108 bpm   Heart Rate (Exit)  78 bpm  -  69 bpm  85 bpm  82 bpm   Oxygen Saturation (Admit)  96 %  -  96 %  97 %  96 %   Oxygen Saturation (Exercise)  93 %  -  95 %  93 %  94 %   Oxygen Saturation (Exit)  97 %  -  95 %  97 %  97 %   Rating of Perceived Exertion (Exercise)  12  -  '13  13  14   '$ Perceived Dyspnea (Exercise)  3  -  '2  1  2   '$ Duration  Progress to 45 minutes of aerobic exercise without signs/symptoms of physical distress  -  Progress to 45 minutes of aerobic exercise without signs/symptoms of physical distress  Progress to 45 minutes of aerobic exercise without signs/symptoms of physical distress  Progress to 45 minutes of aerobic exercise without signs/symptoms of physical distress   Intensity  THRR unchanged  -  THRR unchanged  THRR unchanged  THRR unchanged     Progression   Progression  Continue to progress workloads to maintain intensity without signs/symptoms of physical distress.  -  Continue to progress workloads to maintain intensity without signs/symptoms of physical distress.  Continue to progress workloads to maintain intensity without signs/symptoms of physical distress.  Continue to progress workloads to maintain intensity without signs/symptoms of physical distress.     Resistance Training   Training Prescription  Yes  -  Yes  Yes  Yes   Weight  blue bands  -  blue bands  blue bands  blue bands   Reps  10-15  -  10-15  10-15  10-15   Time  10 Minutes  -   10 Minutes  10 Minutes  10 Minutes     Interval Training   Interval Training  -  -  -  -  No     Treadmill   MPH  1.3  -  2.3  1.2  2.5   Grade  2  -  '3  4  4   '$ Minutes  17  -  '17  17  17     '$ Bike   Level  1  -  1  1.4  1.4   Minutes  17  -  '17  17  17     '$ NuStep   Level  3  -  '4  5  5   '$ SPM  80  -  80  80  80   Minutes  17  -  '17  17  17   '$ METs  2.3  -  3  2.7  4.6     Home Exercise Plan   Plans to continue exercise at  -  Home (  comment) has treadmill at home  -  -  -   Frequency  -  Add 3 additional days to program exercise sessions.  -  -  -      Exercise Comments: Exercise Comments    Row Name 11/04/17 0730           Exercise Comments  Home exercise completed          Exercise Goals and Review: Exercise Goals    Row Name 10/08/17 1024             Exercise Goals   Increase Physical Activity  Yes       Intervention  Provide advice, education, support and counseling about physical activity/exercise needs.;Develop an individualized exercise prescription for aerobic and resistive training based on initial evaluation findings, risk stratification, comorbidities and participant's personal goals.       Expected Outcomes  Short Term: Attend rehab on a regular basis to increase amount of physical activity.;Long Term: Add in home exercise to make exercise part of routine and to increase amount of physical activity.;Long Term: Exercising regularly at least 3-5 days a week.       Increase Strength and Stamina  Yes       Intervention  Provide advice, education, support and counseling about physical activity/exercise needs.;Develop an individualized exercise prescription for aerobic and resistive training based on initial evaluation findings, risk stratification, comorbidities and participant's personal goals.       Expected Outcomes  Short Term: Increase workloads from initial exercise prescription for resistance, speed, and METs.;Short Term: Perform resistance training  exercises routinely during rehab and add in resistance training at home;Long Term: Improve cardiorespiratory fitness, muscular endurance and strength as measured by increased METs and functional capacity (6MWT)       Able to understand and use rate of perceived exertion (RPE) scale  Yes       Intervention  Provide education and explanation on how to use RPE scale       Expected Outcomes  Short Term: Able to use RPE daily in rehab to express subjective intensity level;Long Term:  Able to use RPE to guide intensity level when exercising independently       Able to understand and use Dyspnea scale  Yes       Intervention  Provide education and explanation on how to use Dyspnea scale       Expected Outcomes  Short Term: Able to use Dyspnea scale daily in rehab to express subjective sense of shortness of breath during exertion;Long Term: Able to use Dyspnea scale to guide intensity level when exercising independently       Knowledge and understanding of Target Heart Rate Range (THRR)  Yes       Intervention  Provide education and explanation of THRR including how the numbers were predicted and where they are located for reference       Expected Outcomes  Short Term: Able to state/look up THRR;Short Term: Able to use daily as guideline for intensity in rehab;Long Term: Able to use THRR to govern intensity when exercising independently       Understanding of Exercise Prescription  Yes       Intervention  Provide education, explanation, and written materials on patient's individual exercise prescription       Expected Outcomes  Short Term: Able to explain program exercise prescription;Long Term: Able to explain home exercise prescription to exercise independently          Exercise Goals  Re-Evaluation : Exercise Goals Re-Evaluation    [place] Name 11/02/17 0347 11/26/17 1001 12/20/17 0910         Exercise Goal Re-Evaluation   Exercise Goals Review  Able to understand and use Dyspnea scale;Increase Strength  and Stamina;Increase Physical Activity;Able to understand and use rate of perceived exertion (RPE) scale;Knowledge and understanding of Target Heart Rate Range (THRR);Understanding of Exercise Prescription  Able to understand and use Dyspnea scale;Increase Strength and Stamina;Increase Physical Activity;Able to understand and use rate of perceived exertion (RPE) scale;Knowledge and understanding of Target Heart Rate Range (THRR);Understanding of Exercise Prescription  Able to understand and use Dyspnea scale;Increase Strength and Stamina;Increase Physical Activity;Able to understand and use rate of perceived exertion (RPE) scale;Knowledge and understanding of Target Heart Rate Range (THRR);Understanding of Exercise Prescription     Comments  Patient has only attended 4 rehab sessions. Progressing welll-open to workload increases. Attendance has been consistent. Will cont. to monitor and progress as able.   Patient is progressing well in Pulmonary Rehab. Motivated to increase workloads. Exercising at home. MET level places him in a moderate level. Will cont. to monitor and motivate as tolerated.   Patient is progressing well in Pulmonary Rehab. Motivated to increase workloads. Exercising at home. MET level places him in a moderate level. Will cont. to monitor and motivate as tolerated.      Expected Outcomes  Through exercise at rehab and at home, the patient will decrease shortness of breath with daily activities and feel confident in carrying out an exercise regime at home.   Through exercise at rehab and at home, the patient will decrease shortness of breath with daily activities and feel confident in carrying out an exercise regime at home.   Through exercise at rehab and at home, the patient will decrease shortness of breath with daily activities and feel confident in carrying out an exercise regime at home.         Discharge Exercise Prescription (Final Exercise Prescription Changes): Exercise  Prescription Changes - 12/07/17 1300      Response to Exercise   Blood Pressure (Admit)  118/74    Blood Pressure (Exercise)  140/80    Blood Pressure (Exit)  120/80    Heart Rate (Admit)  85 bpm    Heart Rate (Exercise)  108 bpm    Heart Rate (Exit)  82 bpm    Oxygen Saturation (Admit)  96 %    Oxygen Saturation (Exercise)  94 %    Oxygen Saturation (Exit)  97 %    Rating of Perceived Exertion (Exercise)  14    Perceived Dyspnea (Exercise)  2    Duration  Progress to 45 minutes of aerobic exercise without signs/symptoms of physical distress    Intensity  THRR unchanged      Progression   Progression  Continue to progress workloads to maintain intensity without signs/symptoms of physical distress.      Resistance Training   Training Prescription  Yes    Weight  blue bands    Reps  10-15    Time  10 Minutes      Interval Training   Interval Training  No      Treadmill   MPH  2.5    Grade  4    Minutes  17      Bike   Level  1.4    Minutes  17      NuStep   Level  5    SPM  80  Minutes  17    METs  4.6       Nutrition:  Target Goals: Understanding of nutrition guidelines, daily intake of sodium '1500mg'$ , cholesterol '200mg'$ , calories 30% from fat and 7% or less from saturated fats, daily to have 5 or more servings of fruits and vegetables.  Biometrics: Pre Biometrics - 10/08/17 1058      Pre Biometrics   Grip Strength  31 kg        Nutrition Therapy Plan and Nutrition Goals: Nutrition Therapy & Goals - 10/28/17 1230      Nutrition Therapy   Diet  General, healthful      Personal Nutrition Goals   Nutrition Goal  Pt to decrease portion size of meat at meals to ~ 3ounces    Personal Goal #2  Pt to add a meatless meal at least 1-2 times/week    Personal Goal #3  Pt to increase consumption of whole grains consumed to at least 1 serving/day, working up to 50% of grains consumed as whole grains      Intervention Plan   Intervention  Prescribe, educate and  counsel regarding individualized specific dietary modifications aiming towards targeted core components such as weight, hypertension, lipid management, diabetes, heart failure and other comorbidities.    Expected Outcomes  Short Term Goal: Understand basic principles of dietary content, such as calories, fat, sodium, cholesterol and nutrients.;Long Term Goal: Adherence to prescribed nutrition plan.       Nutrition Assessments: Nutrition Assessments - 10/28/17 1221      Rate Your Plate Scores   Pre Score  47       Nutrition Goals Re-Evaluation:   Nutrition Goals Discharge (Final Nutrition Goals Re-Evaluation):   Psychosocial: Target Goals: Acknowledge presence or absence of significant depression and/or stress, maximize coping skills, provide positive support system. Participant is able to verbalize types and ability to use techniques and skills needed for reducing stress and depression.  Initial Review & Psychosocial Screening: Initial Psych Review & Screening - 10/08/17 1050      Initial Review   Current issues with  Current Stress Concerns lovingly cares for his wife with chronic illness.     Source of Stress Concerns  Family    Comments  intermittent mild caregiver stress however patients sister-in-law willingly steps in to care for patient's wife to allow patient to have some "me time"      [place]?  Yes church family and friends      Barriers   Psychosocial barriers to participate in program  The patient should benefit from training in stress management and relaxation.      Screening Interventions   Interventions  Encouraged to exercise;Provide feedback about the scores to participant    Expected Outcomes  Short Term goal: Utilizing psychosocial counselor, staff and physician to assist with identification of specific Stressors or current issues interfering with healing process. Setting desired goal for each stressor or current issue  identified.;Long Term Goal: Stressors or current issues are controlled or eliminated.       Quality of Life Scores:  Scores of 19 and below usually indicate a poorer quality of life in these areas.  A difference of  2-3 points is a clinically meaningful difference.  A difference of 2-3 points in the total score of the Quality of Life Index has been associated with significant improvement in overall quality of life, self-image, physical symptoms, and general health in studies assessing change in quality of  life.   PHQ-9: Recent Review Flowsheet Data    Depression screen [facility] 2/9 10/08/2017 10/09/2015 07/05/2014   Decreased Interest 0 0 0   Down, Depressed, Hopeless 0 0 0   PHQ - 2 Score 0 0 0     Interpretation of Total Score  Total Score Depression Severity:  1-4 = Minimal depression, 5-9 = Mild depression, 10-14 = Moderate depression, 15-19 = Moderately severe depression, 20-27 = Severe depression   Psychosocial Evaluation and Intervention: Psychosocial Evaluation - 12/20/17 1535      Psychosocial Evaluation & Interventions   Interventions  Encouraged to exercise with the program and follow exercise prescription    Comments  Pt contributes to discussions during the education classes and interacts well with fellow participants and staff.  Pt participates in his hobby of fishing    Expected Outcomes  patient will remain free from psychosocial barriers to participation in pulmonary rehab    Continue Psychosocial Services   Follow up required by staff       Psychosocial Re-Evaluation: Psychosocial Re-Evaluation    [place] Name 11/01/17 1611 11/29/17 1431 12/20/17 1536         Psychosocial Re-Evaluation   Current issues with  Current Stress Concerns  Current Stress Concerns  Current Stress Concerns     Comments  -  -  Pt enjoys participating in pulmonary rehab.  This gives him an opportunity for time for himself which helps him deal with the ongoing care of his wife.     Expected Outcomes   Pt will reach out to other family members when he has caregiver fatigue/stress.  Pt will utilize community resources that offer respite care.  Pt will employ strategies to maintain his health both physically and mentally.  Pt will reach out to other family members when he has caregiver fatigue/stress.  Pt will utilize community resources that offer respite care.  Pt will employ strategies to maintain his health both physically and mentally.  Pt will reach out to other family members when he has caregiver fatigue/stress.  Pt will utilize community resources that offer respite care.  Pt will employ strategies to maintain his health both physically and mentally.     Interventions  Stress management education;Encouraged to attend Pulmonary Rehabilitation for the exercise;Relaxation education  Encouraged to attend Pulmonary Rehabilitation for the exercise;Stress management education;Relaxation education  Encouraged to attend Pulmonary Rehabilitation for the exercise;Stress management education;Relaxation education     Continue Psychosocial Services   Follow up required by staff  Follow up required by staff  Follow up required by staff     Comments  intermittent mild caregiver stress however patients sister-in-law willingly steps in to care for patient's wife to allow patient to have some "me time"  intermittent mild caregiver stress however patients sister-in-law willingly steps in to care for patient's wife to allow patient to have some "me time"  intermittent caregiver stress however patients sister-in-law willingly steps in to care for patient's wife to allow patient to have some "me time"       Initial Review   Source of Stress Concerns  Family  Family  -        Psychosocial Discharge (Final Psychosocial Re-Evaluation): Psychosocial Re-Evaluation - 12/20/17 1536      Psychosocial Re-Evaluation   Current issues with  Current Stress Concerns    Comments  Pt enjoys participating in pulmonary rehab.  This  gives him an opportunity for time for himself which helps him deal with the ongoing care  of his wife.    Expected Outcomes  Pt will reach out to other family members when he has caregiver fatigue/stress.  Pt will utilize community resources that offer respite care.  Pt will employ strategies to maintain his health both physically and mentally.    Interventions  Encouraged to attend Pulmonary Rehabilitation for the exercise;Stress management education;Relaxation education    Continue Psychosocial Services   Follow up required by staff    Comments  intermittent caregiver stress however patients sister-in-law willingly steps in to care for patient's wife to allow patient to have some "me time"       Education: Education Goals: Education classes will be provided on a weekly basis, covering required topics. Participant will state understanding/return demonstration of topics presented.  Learning Barriers/Preferences: Learning Barriers/Preferences - 10/08/17 1049      Learning Barriers/Preferences   Learning Barriers  None    Learning Preferences  Skilled Demonstration;Written Material       Education Topics: Risk Factor Reduction:  -Group instruction that is supported by a PowerPoint presentation. Instructor discusses the definition of a risk factor, different risk factors for pulmonary disease, and how the heart and lungs work together.     Nutrition for Pulmonary Patient:  -Group instruction provided by PowerPoint slides, verbal discussion, and written materials to support subject matter. The instructor gives an explanation and review of healthy diet recommendations, which includes a discussion on weight management, recommendations for fruit and vegetable consumption, as well as protein, fluid, caffeine, fiber, sodium, sugar, and alcohol. Tips for eating when patients are short of breath are discussed.   Pursed Lip Breathing:  -Group instruction that is supported by demonstration and  informational handouts. Instructor discusses the benefits of pursed lip and diaphragmatic breathing and detailed demonstration on how to preform both.     Oxygen Safety:  -Group instruction provided by PowerPoint, verbal discussion, and written material to support subject matter. There is an overview of "What is Oxygen" and "Why do we need it".  Instructor also reviews how to create a safe environment for oxygen use, the importance of using oxygen as prescribed, and the risks of noncompliance. There is a brief discussion on traveling with oxygen and resources the patient may utilize.   PULMONARY REHAB CHRONIC OBSTRUCTIVE PULMONARY DISEASE from 12/16/2017 in [place]  Date  12/16/17  Educator  Cloyde Reams  Instruction Review Code  1- Verbalizes Understanding      Oxygen Equipment:  -Group instruction provided by [employer] utilizing handouts, written materials, and Insurance underwriter.   Signs and Symptoms:  -Group instruction provided by written material and verbal discussion to support subject matter. Warning signs and symptoms of infection, stroke, and heart attack are reviewed and when to call the physician/911 reinforced. Tips for preventing the spread of infection discussed.   PULMONARY REHAB CHRONIC OBSTRUCTIVE PULMONARY DISEASE from 12/16/2017 in [place]  Date  12/09/17  Educator  Remo Lipps  Instruction Review Code  1- Verbalizes Understanding      Advanced Directives:  -Group instruction provided by verbal instruction and written material to support subject matter. Instructor reviews Advanced Directive laws and proper instruction for filling out document.   Pulmonary Video:  -Group video education that reviews the importance of medication and oxygen compliance, exercise, good nutrition, pulmonary hygiene, and pursed lip and diaphragmatic breathing for the pulmonary patient.   Exercise for the Pulmonary  Patient:  -Group instruction that is supported by a PowerPoint presentation.  Instructor discusses benefits of exercise, core components of exercise, frequency, duration, and intensity of an exercise routine, importance of utilizing pulse oximetry during exercise, safety while exercising, and options of places to exercise outside of rehab.     Pulmonary Medications:  -Verbally interactive group education provided by instructor with focus on inhaled medications and proper administration.   Anatomy and Physiology of the Respiratory System and Intimacy:  -Group instruction provided by PowerPoint, verbal discussion, and written material to support subject matter. Instructor reviews respiratory cycle and anatomical components of the respiratory system and their functions. Instructor also reviews differences in obstructive and restrictive respiratory diseases with examples of each. Intimacy, Sex, and Sexuality differences are reviewed with a discussion on how relationships can change when diagnosed with pulmonary disease. Common sexual concerns are reviewed.   PULMONARY REHAB CHRONIC OBSTRUCTIVE PULMONARY DISEASE from 12/09/2017 in [place]  Date  10/28/17  Educator  rn  Instruction Review Code  2- Demonstrated Understanding      MD DAY -A group question and answer session with a medical doctor that allows participants to ask questions that relate to their pulmonary disease state.   PULMONARY REHAB CHRONIC OBSTRUCTIVE PULMONARY DISEASE from 12/09/2017 in [place]  Date  11/04/17  Educator  Dr. Nelda Marseille  Instruction Review Code  1- Verbalizes Understanding      OTHER EDUCATION -Group or individual verbal, written, or video instructions that support the educational goals of the pulmonary rehab program.   PULMONARY REHAB CHRONIC OBSTRUCTIVE PULMONARY DISEASE from 12/09/2017 in [place]  Date  11/25/17   Educator  [place]  Instruction Review Code  1- Verbalizes Understanding      Holiday Eating Survival Tips:  -Group instruction provided by PowerPoint slides, verbal discussion, and written materials to support subject matter. The instructor gives patients tips, tricks, and techniques to help them not only survive but enjoy the holidays despite the onslaught of food that accompanies the holidays.   Knowledge Questionnaire Score: Knowledge Questionnaire Score - 10/08/17 1049      Knowledge Questionnaire Score   Pre Score  9/18       Core Components/Risk Factors/Patient Goals at Admission: Personal Goals and Risk Factors at Admission - 10/08/17 1050      Core Components/Risk Factors/Patient Goals on Admission    Weight Management  Yes    Intervention  Weight Management/Obesity: Establish reasonable short term and long term weight goals.;Obesity: Provide education and appropriate resources to help participant work on and attain dietary goals.;Weight Management: Provide education and appropriate resources to help participant work on and attain dietary goals.;Weight Management: Develop a combined nutrition and exercise program designed to reach desired caloric intake, while maintaining appropriate intake of nutrient and fiber, sodium and fats, and appropriate energy expenditure required for the weight goal.    Expected Outcomes  Short Term: Continue to assess and modify interventions until short term weight is achieved;Long Term: Adherence to nutrition and physical activity/exercise program aimed toward attainment of established weight goal;Weight Maintenance: Understanding of the daily nutrition guidelines, which includes 25-35% calories from fat, 7% or less cal from saturated fats, less than '200mg'$  cholesterol, less than 1.5gm of sodium, & 5 or more servings of fruits and vegetables daily;Weight Loss: Understanding of general recommendations for a balanced  deficit meal plan, which promotes 1-2 lb weight loss per week and includes a negative energy balance of [id number] kcal/d;Understanding recommendations for  meals to include 15-35% energy as protein, 25-35% energy from fat, 35-60% energy from carbohydrates, less than '200mg'$  of dietary cholesterol, 20-35 gm of total fiber daily;Understanding of distribution of calorie intake throughout the day with the consumption of 4-5 meals/snacks    Improve shortness of breath with ADL's  Yes    Intervention  Provide education, individualized exercise plan and daily activity instruction to help decrease symptoms of SOB with activities of daily living.    Expected Outcomes  Short Term: Improve cardiorespiratory fitness to achieve a reduction of symptoms when performing ADLs;Long Term: Be able to perform more ADLs without symptoms or delay the onset of symptoms       Core Components/Risk Factors/Patient Goals Review:  Goals and Risk Factor Review    Row Name 11/01/17 1601 11/01/17 1604 11/29/17 1429 12/20/17 1532       Core Components/Risk Factors/Patient Goals Review   Personal Goals Review  Weight Management/Obesity;Improve shortness of breath with ADL's  -  Weight Management/Obesity;Improve shortness of breath with ADL's  Weight Management/Obesity;Improve shortness of breath with ADL's    Review  Pt is off to a great start with his participation in pulmonary rehab.  Pt desires   Pt is off to a great start with his participation in pulmonary rehab.  Pt desires to losse weight. During his two week participation in pulmonary rehab, he has lost 1.4 kg.  Pt reports he continues to have shortness of breath but feels it is managed better with PLB and diaphragmatic breaathing. Pt maintains o2 sat in the mid 90's at rest and mid to low 90's on exertion  has lost 3 kg, progressing well, walking on treadmill @ 2.5 mph and 4% grade, level 5 on nustep, and level 1.4 on bicycle,    Kevin Yang has completed 17 exercise sessions.  Pt is  nearing the end of his [place] authorization which is 7/29.  Multiple attempts to contact the [place] for an extension with no sucess.  Pt has lost 3.9 kg, Pt beginning weight on 6/27 was 106.7 to 102.3 on 7/18. Pt is progressing well, walking on treadmill @ 2.5 mph and 4% grade, level 5 on nustep, and level 1.4 on bicycle,     Expected Outcomes  -  Pt will achieve his desired weight loss and report improved management of his shortness of breath.  Pt will achieve his desired weight loss and report improved management of his shortness of breath.  See Admission Goals        Core Components/Risk Factors/Patient Goals at Discharge (Final Review):  Goals and Risk Factor Review - 12/20/17 1532      Core Components/Risk Factors/Patient Goals Review   Personal Goals Review  Weight Management/Obesity;Improve shortness of breath with ADL's    Review   Kevin Yang has completed 17 exercise sessions.  Pt is nearing the end of his [place] authorization which is 7/29.  Multiple attempts to contact the [place] for an extension with no sucess.  Pt has lost 3.9 kg, Pt beginning weight on 6/27 was 106.7 to 102.3 on 7/18. Pt is progressing well, walking on treadmill @ 2.5 mph and 4% grade, level 5 on nustep, and level 1.4 on bicycle,     Expected Outcomes  See Admission Goals        ITP Comments: ITP Comments    Row Name 11/03/17 1042 12/20/17 1532         ITP Comments  Dr. Jennet Maduro, Medical Director  Dr. Jennet Maduro, Medical Director  Comments:  Pt has completed 17 exercise sessions.  Pt authorization with [facility] ends on 12/27/17.Cherre Huger, BSN Cardiac and Training and development officer

## 2017-12-21 ENCOUNTER — Encounter (HOSPITAL_COMMUNITY)
Admission: RE | Admit: 2017-12-21 | Discharge: 2017-12-21 | Disposition: A | Discharge status: Home / Self Care | Payer: No Typology Code available for payment source | Source: Ambulatory Visit | Attending: Internal Medicine | Admitting: Internal Medicine

## 2017-12-21 VITALS — Wt 225.1 lb

## 2017-12-21 DIAGNOSIS — J449 Chronic obstructive pulmonary disease, unspecified: Secondary | ICD-10-CM | POA: Diagnosis not present

## 2017-12-23 ENCOUNTER — Encounter (HOSPITAL_COMMUNITY): Payer: No Typology Code available for payment source

## 2017-12-28 ENCOUNTER — Encounter (HOSPITAL_COMMUNITY): Payer: No Typology Code available for payment source

## 2017-12-30 ENCOUNTER — Encounter (HOSPITAL_COMMUNITY): Payer: No Typology Code available for payment source

## 2018-01-04 ENCOUNTER — Encounter (HOSPITAL_COMMUNITY): Payer: No Typology Code available for payment source

## 2018-01-04 ENCOUNTER — Encounter (HOSPITAL_COMMUNITY): Admission: RE | Admit: 2018-01-04 | Payer: No Typology Code available for payment source | Source: Ambulatory Visit

## 2018-01-04 ENCOUNTER — Encounter (HOSPITAL_COMMUNITY)
Admission: RE | Admit: 2018-01-04 | Discharge: 2018-01-04 | Disposition: A | Discharge status: Home / Self Care | Payer: No Typology Code available for payment source | Source: Ambulatory Visit | Attending: Pulmonary Disease | Admitting: Pulmonary Disease

## 2018-01-04 VITALS — Wt 222.2 lb

## 2018-01-04 DIAGNOSIS — J449 Chronic obstructive pulmonary disease, unspecified: Secondary | ICD-10-CM | POA: Diagnosis not present

## 2018-01-04 NOTE — Progress Notes (Signed)
Daily Session Note  Patient Details  Name: Kevin Yang MRN: [id number] Date of Birth: [birth year] Referring Provider:     Pulmonary Rehab Walk Test from 10/12/2017 in [place]  Referring Provider  Dr. Nelda Marseille      Encounter Date: 01/04/2018  Check In: Session Check In - 01/04/18 1225      Check-In   Supervising physician immediately available to respond to emergencies  [facility] immediately available    Physician(s)  Dr. Reesa Chew    Location  [facility]    Staff Present  Rosebud Poles, RN, BSN;Carlette Wilber Oliphant, RN, BSN;Jamarkis Branam, MS, ACSM RCEP, Exercise Physiologist;Lisa Colletta [place], RN, MHA    Medication changes reported      No    Fall or balance concerns reported     No    Tobacco Cessation  No Change    Warm-up and Cool-down  Performed as group-led instruction    Resistance Training Performed  Yes    VAD Patient?  No    PAD/SET Patient?  No      Pain Assessment   Currently in Pain?  No/denies    Multiple Pain Sites  No       Capillary Blood Glucose: No results found for this or any previous visit (from the past 24 hour(s)).  Exercise Prescription Changes - 01/04/18 1200      Response to Exercise   Blood Pressure (Admit)  140/80    Blood Pressure (Exercise)  180/80    Blood Pressure (Exit)  136/78    Heart Rate (Admit)  78 bpm    Heart Rate (Exercise)  105 bpm    Heart Rate (Exit)  81 bpm    Oxygen Saturation (Admit)  97 %    Oxygen Saturation (Exercise)  94 %    Oxygen Saturation (Exit)  93 %    Rating of Perceived Exertion (Exercise)  13    Perceived Dyspnea (Exercise)  2    Duration  Progress to 45 minutes of aerobic exercise without signs/symptoms of physical distress    Intensity  THRR unchanged      Progression   Progression  Continue to progress workloads to maintain intensity without signs/symptoms of physical distress.      Resistance Training   Training Prescription   Yes    Weight  blue bands    Reps  10-15    Time  10 Minutes      Interval Training   Interval Training  No      Treadmill   MPH  2.5    Grade  4    Minutes  17      NuStep   Level  5    SPM  80    Minutes  17    METs  4.8       Social History   Tobacco Use  Smoking Status Former Smoker  . Types: Cigars  . Last attempt to quit: 09/07/1986  . Years since quitting: 31.3  Smokeless Tobacco Never Used    Goals Met:  Exercise tolerated well No report of cardiac concerns or symptoms Strength training completed today  Goals Unmet:  Not Applicable  Comments: Service time is from 10:30a to 12:15p    Dr. Rush Farmer is Medical Director for Pulmonary Rehab at [facility].

## 2018-01-06 ENCOUNTER — Encounter (HOSPITAL_COMMUNITY)
Admission: RE | Admit: 2018-01-06 | Discharge: 2018-01-06 | Disposition: A | Discharge status: Home / Self Care | Payer: No Typology Code available for payment source | Source: Ambulatory Visit | Attending: Internal Medicine | Admitting: Internal Medicine

## 2018-01-06 ENCOUNTER — Encounter (HOSPITAL_COMMUNITY): Payer: No Typology Code available for payment source

## 2018-01-06 DIAGNOSIS — J449 Chronic obstructive pulmonary disease, unspecified: Secondary | ICD-10-CM | POA: Diagnosis not present

## 2018-01-06 NOTE — Progress Notes (Signed)
Daily Session Note  Patient Details  Name: Kevin Yang MRN: [id number] Date of Birth: [birth year] Referring Provider:     Pulmonary Rehab Walk Test from 10/12/2017 in [place]  Referring Provider  Dr. Nelda Marseille      Encounter Date: 01/06/2018  Check In: Session Check In - 01/06/18 1238      Check-In   Supervising physician immediately available to respond to emergencies  [facility] immediately available    Physician(s)  Dr. [place] Pink    Location  [facility]    Staff Present  Su Hilt, MS, ACSM RCEP, Exercise Physiologist;Lisa Colletta [place], RN, MHA    Medication changes reported      No    Fall or balance concerns reported     No    Tobacco Cessation  No Change    Warm-up and Cool-down  Performed as group-led instruction    Resistance Training Performed  Yes    VAD Patient?  No      Pain Assessment   Currently in Pain?  No/denies    Multiple Pain Sites  No       Capillary Blood Glucose: No results found for this or any previous visit (from the past 24 hour(s)).    Social History   Tobacco Use  Smoking Status Former Smoker  . Types: Cigars  . Last attempt to quit: 09/07/1986  . Years since quitting: 31.3  Smokeless Tobacco Never Used    Goals Met:  Achieving weight loss Exercise tolerated well Personal goals reviewed  Goals Unmet:  Not Applicable  Comments: Service time is from 10:30a to 12:15p    Dr. Rush Farmer is Medical Director for Pulmonary Rehab at [facility].

## 2018-01-11 ENCOUNTER — Encounter (HOSPITAL_COMMUNITY)
Admission: RE | Admit: 2018-01-11 | Discharge: 2018-01-11 | Disposition: A | Discharge status: Home / Self Care | Payer: No Typology Code available for payment source | Source: Ambulatory Visit | Attending: Internal Medicine | Admitting: Internal Medicine

## 2018-01-11 ENCOUNTER — Encounter (HOSPITAL_COMMUNITY): Payer: No Typology Code available for payment source

## 2018-01-11 DIAGNOSIS — J449 Chronic obstructive pulmonary disease, unspecified: Secondary | ICD-10-CM | POA: Diagnosis not present

## 2018-01-11 NOTE — Progress Notes (Signed)
Daily Session Note  Patient Details  Name: Kevin Yang MRN: [id number] Date of Birth: [birth year] Referring Provider:     Pulmonary Rehab Walk Test from 10/12/2017 in [place]  Referring Provider  Dr. Nelda Marseille      Encounter Date: 01/11/2018  Check In: Session Check In - 01/11/18 1135      Check-In   Supervising physician immediately available to respond to emergencies  [facility] immediately available    Physician(s)  Dr. Florene Glen    Location  [facility]    Staff Present  Su Hilt, MS, ACSM RCEP, Exercise Physiologist;Youssouf Shipley Colletta [place], RN, MHA    Medication changes reported      No    Fall or balance concerns reported     No    Tobacco Cessation  No Change    Warm-up and Cool-down  Performed as group-led instruction    Resistance Training Performed  Yes    PAD/SET Patient?  No      Pain Assessment   Currently in Pain?  No/denies       Capillary Blood Glucose: No results found for this or any previous visit (from the past 24 hour(s)).    Social History   Tobacco Use  Smoking Status Former Smoker  . Types: Cigars  . Last attempt to quit: 09/07/1986  . Years since quitting: 31.3  Smokeless Tobacco Never Used    Goals Met:  Exercise tolerated well No report of cardiac concerns or symptoms Strength training completed today  Goals Unmet:  Not Applicable  Comments: Service time is from 1030 to 1200    Dr. Rush Farmer is Medical Director for Pulmonary Rehab at [facility].

## 2018-01-13 ENCOUNTER — Encounter (HOSPITAL_COMMUNITY)
Admission: RE | Admit: 2018-01-13 | Discharge: 2018-01-13 | Disposition: A | Discharge status: Home / Self Care | Payer: No Typology Code available for payment source | Source: Ambulatory Visit | Attending: Internal Medicine | Admitting: Internal Medicine

## 2018-01-13 ENCOUNTER — Encounter (HOSPITAL_COMMUNITY): Payer: No Typology Code available for payment source

## 2018-01-13 DIAGNOSIS — J449 Chronic obstructive pulmonary disease, unspecified: Secondary | ICD-10-CM | POA: Diagnosis not present

## 2018-01-13 NOTE — Progress Notes (Signed)
Daily Session Note  Patient Details  Name: Kevin Yang MRN: [id number] Date of Birth: 12-04-1950 Referring Provider:     Pulmonary Rehab Walk Test from 10/12/2017 in [place]  Referring Provider  Dr. Nelda Marseille      Encounter Date: 01/13/2018  Check In: Session Check In - 01/13/18 1015      Check-In   Supervising physician immediately available to respond to emergencies  [facility] immediately available    Physician(s)  Dr. Florene Glen    Location  [facility]    Staff Present  Su Hilt, MS, ACSM RCEP, Exercise Physiologist;Annedrea Rosezella [place], RN, MHA;Carlette Wilber Oliphant, RN, BSN    Medication changes reported      No    Fall or balance concerns reported     No    Tobacco Cessation  No Change    Warm-up and Cool-down  Performed as group-led Higher education careers adviser Performed  Yes    VAD Patient?  No    PAD/SET Patient?  No      Pain Assessment   Currently in Pain?  No/denies    Multiple Pain Sites  No       Capillary Blood Glucose: No results found for this or any previous visit (from the past 24 hour(s)).    Social History   Tobacco Use  Smoking Status Former Smoker  . Types: Cigars  . Last attempt to quit: 09/07/1986  . Years since quitting: 31.3  Smokeless Tobacco Never Used    Goals Met:  Achieving weight loss Exercise tolerated well Personal goals reviewed  Goals Unmet:  Not Applicable  Comments: Service time is from 10:30a to 12:30p    Dr. Rush Farmer is Medical Director for Pulmonary Rehab at [facility].

## 2018-01-18 ENCOUNTER — Encounter (HOSPITAL_COMMUNITY): Payer: No Typology Code available for payment source

## 2018-01-18 ENCOUNTER — Encounter (HOSPITAL_COMMUNITY)
Admission: RE | Admit: 2018-01-18 | Discharge: 2018-01-18 | Disposition: A | Discharge status: Home / Self Care | Payer: No Typology Code available for payment source | Source: Ambulatory Visit | Attending: Internal Medicine | Admitting: Internal Medicine

## 2018-01-18 VITALS — Wt 221.3 lb

## 2018-01-18 DIAGNOSIS — J449 Chronic obstructive pulmonary disease, unspecified: Secondary | ICD-10-CM | POA: Diagnosis not present

## 2018-01-18 NOTE — Progress Notes (Signed)
Daily Session Note  Patient Details  Name: Kevin Yang MRN: [id number] Date of Birth: 21-Sep-1950 Referring Provider:     Pulmonary Rehab Walk Test from 10/12/2017 in [place]  Referring Provider  Dr. Nelda Marseille      Encounter Date: 01/18/2018  Check In: Session Check In - 01/18/18 1230      Check-In   Supervising physician immediately available to respond to emergencies  [facility] immediately available    Physician(s)  Dr. Florene Glen    Location  [facility]    Staff Present  Su Hilt, MS, ACSM RCEP, Exercise Physiologist;Annedrea Rosezella [place], RN, MHA;Carlette Wilber Oliphant, RN, Roque Cash, RN    Medication changes reported      No    Fall or balance concerns reported     No    Tobacco Cessation  No Change    Warm-up and Cool-down  Performed as group-led instruction    Resistance Training Performed  No    VAD Patient?  No    PAD/SET Patient?  No      Pain Assessment   Currently in Pain?  No/denies       Capillary Blood Glucose: No results found for this or any previous visit (from the past 24 hour(s)).  Exercise Prescription Changes - 01/18/18 1200      Response to Exercise   Blood Pressure (Admit)  140/82    Blood Pressure (Exercise)  154/90    Blood Pressure (Exit)  136/76    Heart Rate (Admit)  85 bpm    Heart Rate (Exercise)  102 bpm    Heart Rate (Exit)  83 bpm    Oxygen Saturation (Admit)  97 %    Oxygen Saturation (Exercise)  95 %    Oxygen Saturation (Exit)  97 %    Rating of Perceived Exertion (Exercise)  13    Perceived Dyspnea (Exercise)  2    Duration  Continue with 45 min of aerobic exercise without signs/symptoms of physical distress.    Intensity  THRR unchanged      Progression   Progression  Continue to progress workloads to maintain intensity without signs/symptoms of physical distress.      Resistance Training   Training Prescription  Yes    Weight  blue bands    Reps  10-15    Time  10 Minutes      Interval Training   Interval Training  No      Treadmill   MPH  2.5    Grade  5    Minutes  17      Bike   Level  1.5    Minutes  17      NuStep   Level  5    SPM  80    Minutes  17    METs  3.5       Social History   Tobacco Use  Smoking Status Former Smoker  . Types: Cigars  . Last attempt to quit: 09/07/1986  . Years since quitting: 31.3  Smokeless Tobacco Never Used    Goals Met:  Exercise tolerated well No report of cardiac concerns or symptoms Strength training completed today  Goals Unmet:  Not Applicable  Comments: Service time is from 1030 to 1220    Dr. Rush Farmer is Medical Director for Pulmonary Rehab at [facility].

## 2018-01-18 NOTE — Progress Notes (Signed)
Pulmonary Individual Treatment Plan  Patient Details  Name: Kevin Yang MRN: [id number] Date of Birth: 1951/02/23 Referring Provider:     Pulmonary Rehab Walk Test from 10/12/2017 in [place]  Referring Provider  Dr. Nelda Marseille      Initial Encounter Date:    Pulmonary Rehab Walk Test from 10/12/2017 in [place]  Date  10/12/17      Visit Diagnosis: Chronic obstructive pulmonary disease, unspecified COPD type ([place])  Patient's Home Medications on Admission:   Current Outpatient Medications:  .  albuterol (PROVENTIL HFA;VENTOLIN HFA) 108 (90 BASE) MCG/ACT inhaler, Inhale 2 puffs into the lungs every 6 (six) hours as needed for wheezing or shortness of breath. , Disp: , Rfl:  .  budesonide-formoterol (SYMBICORT) 160-4.5 MCG/ACT inhaler, Inhale 2 puffs into the lungs 2 (two) times daily., Disp: , Rfl:  .  cetirizine (ZYRTEC) 10 MG tablet, Take 10 mg by mouth daily., Disp: , Rfl:  .  diclofenac (VOLTAREN) 75 MG EC tablet, Take 75 mg by mouth 2 (two) times daily., Disp: , Rfl:  .  fluticasone (FLONASE) 50 MCG/ACT nasal spray, Place 1 spray into both nostrils as needed. 2 sprays each nostril once a day, Disp: , Rfl:  .  formoterol (PERFOROMIST) 20 MCG/2ML nebulizer solution, Take 20 mcg by nebulization 4 (four) times daily., Disp: , Rfl:  .  HYDROcodone-acetaminophen (NORCO) 10-325 MG tablet, Take 1 tablet by mouth 2 (two) times daily as needed for severe pain., Disp: , Rfl:  .  inFLIXimab-abda 100 MG SOLR, Inject into the vein., Disp: , Rfl:  .  ipratropium-albuterol (DUONEB) 0.5-2.5 (3) MG/3ML SOLN, Inhale into the lungs., Disp: , Rfl:  .  methylPREDNISolone acetate (DEPO-MEDROL) 40 MG/ML injection, 40 mg once., Disp: , Rfl:  .  sildenafil (VIAGRA) 50 MG tablet, Take 1 tablet (50 mg total) by mouth as needed for erectile dysfunction., Disp: 10 tablet, Rfl: 2 .  simvastatin (ZOCOR) 80 MG tablet, Take 80 mg by mouth at  bedtime., Disp: , Rfl:  .  tamsulosin (FLOMAX) 0.4 MG CAPS, Take 0.4 mg by mouth at bedtime., Disp: , Rfl:   Past Medical History: Past Medical History:  Diagnosis Date  . Asthma   . COPD (chronic obstructive pulmonary disease) ([place])   . Crohn's disease ([place])     Tobacco Use: Social History   Tobacco Use  Smoking Status Former Smoker  . Types: Cigars  . Last attempt to quit: 09/07/1986  . Years since quitting: 31.3  Smokeless Tobacco Never Used    Labs: Recent Chemical engineer    Labs for ITP Cardiac and Pulmonary Rehab Latest Ref Rng & Units 04/22/2007 04/07/2009 04/08/2009 10/09/2015   Cholestrol 0 - 200 mg/dL 212(HH) - 270 ATP III CLASSIFICATION: <200     mg/dL   Desirable 200-239  mg/dL   Borderline High >=240    mg/dL   High(H) 282(H)   LDLCALC 0 - 99 mg/dL - - 206 Total Cholesterol/HDL:CHD Risk Coronary Heart Disease Risk Table Men   Women 1/2 Average Risk   3.4   3.3 Average Risk       5.0   4.4 2 X Average Risk   9.6   7.1 3 X Average Risk  23.4   11.0 Use the calculated Patient Ratio above and the CHD Risk Table to determine the patient's CHD Risk. ATP III CLASSIFICATION (LDL): <100     mg/dL   Optimal 100-129  mg/dL  Near or Above Optimal 130-159  mg/dL   Borderline 160-189  mg/dL   High >190     mg/dL   Very High(H) 189(H)   LDLDIRECT mg/dL 146.5 - - -   HDL >39.00 mg/dL 37.3(L) - 40 56.90   Trlycerides 0.0 - 149.0 mg/dL 113 - 122 180.0(H)   TCO2 0 - 100 mmol/L - 30 - -      Capillary Blood Glucose: No results found for: GLUCAP   Pulmonary Assessment Scores: Pulmonary Assessment Scores    Row Name 10/12/17 1646 12/21/17 1308 12/21/17 1440     ADL UCSD   ADL Phase  Entry  Exit  Exit   SOB Score total  -  -  60     CAT Score   CAT Score  -  -  pre/25    exit/20     mMRC Score   mMRC Score  1  0  -      Pulmonary Function Assessment: Pulmonary Function Assessment - 10/08/17 1049      Breath   Bilateral Breath Sounds   Clear;Decreased    Shortness of Breath  Yes;Limiting activity       Exercise Target Goals: Exercise Program Goal: Individual exercise prescription set using results from initial 6 min walk test and THRR while considering  patient's activity barriers and safety.   Exercise Prescription Goal: Initial exercise prescription builds to 30-45 minutes a day of aerobic activity, 2-3 days per week.  Home exercise guidelines will be given to patient during program as part of exercise prescription that the participant will acknowledge.  Activity Barriers & Risk Stratification: Activity Barriers & Cardiac Risk Stratification - 10/08/17 1023      Activity Barriers & Cardiac Risk Stratification   Activity Barriers  Back Problems;Shortness of Breath;Deconditioning       6 Minute Walk: 6 Minute Walk    Row Name 10/12/17 1641 10/12/17 1646 12/21/17 1305     6 Minute Walk   Phase  Initial  Initial  Discharge   Distance  1600 feet  -  2000 feet   Distance Feet Change  -  -  400 ft   Walk Time  6 minutes  -  6 minutes   # of Rest Breaks  0  -  0   MPH  3.03  -  3.78   METS  3.3  -  3.91   RPE  15  -  13   Perceived Dyspnea   2  -  2   Symptoms  No  -  No   Resting HR  96 bpm  -  82 bpm   Resting BP  138/84  -  124/68   Resting Oxygen Saturation   95 %  -  97 %   Exercise Oxygen Saturation  during 6 min walk  88 %  -  95 %   Max Ex. HR  116 bpm  -  106 bpm   Max Ex. BP  114/84  -  140/80     Interval HR   1 Minute HR  107  -  99   2 Minute HR  108  -  101   3 Minute HR  107  -  103   4 Minute HR  104  -  104   5 Minute HR  114  -  106   6 Minute HR  116  -  105   2 Minute Post HR  101  -  -  Interval Heart Rate?  Yes  -  Yes     Interval Oxygen   Interval Oxygen?  Yes  -  Yes   Baseline Oxygen Saturation %  95 %  -  97 %   1 Minute Oxygen Saturation %  94 %  -  96 %   1 Minute Liters of Oxygen  0 L  -  0 L   2 Minute Oxygen Saturation %  88 %  -  95 %   2 Minute Liters of Oxygen   0 L  -  0 L   3 Minute Oxygen Saturation %  89 %  -  95 %   3 Minute Liters of Oxygen  0 L  -  0 L   4 Minute Oxygen Saturation %  89 %  -  96 %   4 Minute Liters of Oxygen  0 L  -  0 L   5 Minute Oxygen Saturation %  89 %  -  95 %   5 Minute Liters of Oxygen  0 L  -  0 L   6 Minute Oxygen Saturation %  91 %  -  96 %   6 Minute Liters of Oxygen  0 L  -  0 L   2 Minute Post Oxygen Saturation %  96 %  -  -   2 Minute Post Liters of Oxygen  0 L  -  -      Oxygen Initial Assessment: Oxygen Initial Assessment - 10/12/17 1646      Initial 6 min Walk   Oxygen Used  None      Program Oxygen Prescription   Program Oxygen Prescription  None       Oxygen Re-Evaluation: Oxygen Re-Evaluation    Row Name 11/02/17 0814 11/26/17 1000 12/20/17 0910 01/17/18 1048       Program Oxygen Prescription   Program Oxygen Prescription  None  None  None  None      Home Oxygen   Home Oxygen Device  None  None  None  None    Sleep Oxygen Prescription  CPAP  CPAP  CPAP  CPAP    Home Exercise Oxygen Prescription  None  None  None  None    Home at Rest Exercise Oxygen Prescription  None  None  None  None       Oxygen Discharge (Final Oxygen Re-Evaluation): Oxygen Re-Evaluation - 01/17/18 1048      Program Oxygen Prescription   Program Oxygen Prescription  None      Home Oxygen   Home Oxygen Device  None    Sleep Oxygen Prescription  CPAP    Home Exercise Oxygen Prescription  None    Home at Rest Exercise Oxygen Prescription  None       Initial Exercise Prescription: Initial Exercise Prescription - 10/12/17 1600      Date of Initial Exercise RX and Referring Provider   Date  10/12/17    Referring Provider  Dr. Nelda Marseille      Bike   Level  1    Minutes  17      NuStep   Level  2    SPM  80    Minutes  17    METs  1.5      Track   Laps  10    Minutes  17      Prescription Details   Frequency (times per week)  2  Duration  Progress to 45 minutes of aerobic exercise without  signs/symptoms of physical distress      Intensity   THRR 40-80% of Max Heartrate  52-124    Ratings of Perceived Exertion  11-13    Perceived Dyspnea  0-4      Progression   Progression  Continue progressive overload as per policy without signs/symptoms or physical distress.      Resistance Training   Training Prescription  Yes    Weight  blue bands    Reps  10-15       Perform Capillary Blood Glucose checks as needed.  Exercise Prescription Changes:  Exercise Prescription Changes    Row Name 10/26/17 1200 11/02/17 0731 11/09/17 1200 11/23/17 1200 12/07/17 1300     Response to Exercise   Blood Pressure (Admit)  118/72  -  124/76  104/80  118/74   Blood Pressure (Exercise)  160/56  -  120/70  122/78  140/80   Blood Pressure (Exit)  116/82  -  114/76  120/80  120/80   Heart Rate (Admit)  84 bpm  -  84 bpm  84 bpm  85 bpm   Heart Rate (Exercise)  104 bpm  -  113 bpm  113 bpm  108 bpm   Heart Rate (Exit)  78 bpm  -  69 bpm  85 bpm  82 bpm   Oxygen Saturation (Admit)  96 %  -  96 %  97 %  96 %   Oxygen Saturation (Exercise)  93 %  -  95 %  93 %  94 %   Oxygen Saturation (Exit)  97 %  -  95 %  97 %  97 %   Rating of Perceived Exertion (Exercise)  12  -  _0 Perceived Dyspnea (Exercise)  3  -  _1 Duration  Progress to 45 minutes of aerobic exercise without signs/symptoms of physical distress  -  Progress to 45 minutes of aerobic exercise without signs/symptoms of physical distress  Progress to 45 minutes of aerobic exercise without signs/symptoms of physical distress  Progress to 45 minutes of aerobic exercise without signs/symptoms of physical distress   Intensity  THRR unchanged  -  THRR unchanged  THRR unchanged  THRR unchanged     Progression   Progression  Continue to progress workloads to maintain intensity without signs/symptoms of physical distress.  -  Continue to progress workloads to maintain intensity without signs/symptoms of physical distress.  Continue  to progress workloads to maintain intensity without signs/symptoms of physical distress.  Continue to progress workloads to maintain intensity without signs/symptoms of physical distress.     Resistance Training   Training Prescription  Yes  -  Yes  Yes  Yes   Weight  blue bands  -  blue bands  blue bands  blue bands   Reps  10-15  -  10-15  10-15  10-15   Time  10 Minutes  -  10 Minutes  10 Minutes  10 Minutes     Interval Training   Interval Training  -  -  -  -  No     Treadmill   MPH  1.3  -  2.3  1.2  2.5   Grade  2  -  _2 Minutes  17  -  _3 Bike  Level  1  -  1  1.4  1.4   Minutes  17  -  _0 NuStep   Level  3  -  _1 SPM  80  -  80  80  80   Minutes  17  -  _2 METs  2.3  -  3  2.7  4.6     Home Exercise Plan   Plans to continue exercise at  -  Home (comment) has treadmill at home  -  -  -   Frequency  -  Add 3 additional days to program exercise sessions.  -  -  -   Row Name 01/04/18 1200 01/18/18 1200           Response to Exercise   Blood Pressure (Admit)  140/80  140/82      Blood Pressure (Exercise)  180/80  154/90      Blood Pressure (Exit)  136/78  136/76      Heart Rate (Admit)  78 bpm  85 bpm      Heart Rate (Exercise)  105 bpm  102 bpm      Heart Rate (Exit)  81 bpm  83 bpm      Oxygen Saturation (Admit)  97 %  97 %      Oxygen Saturation (Exercise)  94 %  95 %      Oxygen Saturation (Exit)  93 %  97 %      Rating of Perceived Exertion (Exercise)  13  13      Perceived Dyspnea (Exercise)  2  2      Duration  Progress to 45 minutes of aerobic exercise without signs/symptoms of physical distress  Continue with 45 min of aerobic exercise without signs/symptoms of physical distress.      Intensity  THRR unchanged  THRR unchanged        Progression   Progression  Continue to progress workloads to maintain intensity without signs/symptoms of physical distress.  Continue to progress workloads to maintain  intensity without signs/symptoms of physical distress.        Resistance Training   Training Prescription  Yes  Yes      Weight  blue bands  blue bands      Reps  10-15  10-15      Time  10 Minutes  10 Minutes        Interval Training   Interval Training  No  No        Treadmill   MPH  2.5  2.5      Grade  4  5      Minutes  17  17        Bike   Level  -  1.5      Minutes  -  17        NuStep   Level  5  5      SPM  80  80      Minutes  17  17      METs  4.8  3.5         Exercise Comments:  Exercise Comments    Row Name 11/04/17 0730           Exercise Comments  Home exercise completed          Exercise Goals and Review:  Exercise  Goals    Row Name 10/08/17 1024             Exercise Goals   Increase Physical Activity  Yes       Intervention  Provide advice, education, support and counseling about physical activity/exercise needs.;Develop an individualized exercise prescription for aerobic and resistive training based on initial evaluation findings, risk stratification, comorbidities and participant's personal goals.       Expected Outcomes  Short Term: Attend rehab on a regular basis to increase amount of physical activity.;Long Term: Add in home exercise to make exercise part of routine and to increase amount of physical activity.;Long Term: Exercising regularly at least 3-5 days a week.       Increase Strength and Stamina  Yes       Intervention  Provide advice, education, support and counseling about physical activity/exercise needs.;Develop an individualized exercise prescription for aerobic and resistive training based on initial evaluation findings, risk stratification, comorbidities and participant's personal goals.       Expected Outcomes  Short Term: Increase workloads from initial exercise prescription for resistance, speed, and METs.;Short Term: Perform resistance training exercises routinely during rehab and add in resistance training at home;Long  Term: Improve cardiorespiratory fitness, muscular endurance and strength as measured by increased METs and functional capacity (6MWT)       Able to understand and use rate of perceived exertion (RPE) scale  Yes       Intervention  Provide education and explanation on how to use RPE scale       Expected Outcomes  Short Term: Able to use RPE daily in rehab to express subjective intensity level;Long Term:  Able to use RPE to guide intensity level when exercising independently       Able to understand and use Dyspnea scale  Yes       Intervention  Provide education and explanation on how to use Dyspnea scale       Expected Outcomes  Short Term: Able to use Dyspnea scale daily in rehab to express subjective sense of shortness of breath during exertion;Long Term: Able to use Dyspnea scale to guide intensity level when exercising independently       Knowledge and understanding of Target Heart Rate Range (THRR)  Yes       Intervention  Provide education and explanation of THRR including how the numbers were predicted and where they are located for reference       Expected Outcomes  Short Term: Able to state/look up THRR;Short Term: Able to use daily as guideline for intensity in rehab;Long Term: Able to use THRR to govern intensity when exercising independently       Understanding of Exercise Prescription  Yes       Intervention  Provide education, explanation, and written materials on patient's individual exercise prescription       Expected Outcomes  Short Term: Able to explain program exercise prescription;Long Term: Able to explain home exercise prescription to exercise independently          Exercise Goals Re-Evaluation : Exercise Goals Re-Evaluation    Row Name 11/02/17 9983 11/26/17 1001 12/20/17 0910 01/17/18 1048       Exercise Goal Re-Evaluation   Exercise Goals Review  Able to understand and use Dyspnea scale;Increase Strength and Stamina;Increase Physical Activity;Able to understand and use  rate of perceived exertion (RPE) scale;Knowledge and understanding of Target Heart Rate Range (THRR);Understanding of Exercise Prescription  Able to understand and use Dyspnea scale;Increase Strength and  Stamina;Increase Physical Activity;Able to understand and use rate of perceived exertion (RPE) scale;Knowledge and understanding of Target Heart Rate Range (THRR);Understanding of Exercise Prescription  Able to understand and use Dyspnea scale;Increase Strength and Stamina;Increase Physical Activity;Able to understand and use rate of perceived exertion (RPE) scale;Knowledge and understanding of Target Heart Rate Range (THRR);Understanding of Exercise Prescription  Able to understand and use Dyspnea scale;Increase Strength and Stamina;Increase Physical Activity;Able to understand and use rate of perceived exertion (RPE) scale;Knowledge and understanding of Target Heart Rate Range (THRR);Understanding of Exercise Prescription    Comments  Patient has only attended 4 rehab sessions. Progressing welll-open to workload increases. Attendance has been consistent. Will cont. to monitor and progress as able.   Patient is progressing well in Pulmonary Rehab. Motivated to increase workloads. Exercising at home. MET level places him in a moderate level. Will cont. to monitor and motivate as tolerated.   Patient is progressing well in Pulmonary Rehab. Motivated to increase workloads. Exercising at home. MET level places him in a moderate level. Will cont. to monitor and motivate as tolerated.   Patient is progressing well in Pulmonary Rehab. Motivated to increase workloads. Exercising at home. Has lost 12.5 pounds since he started the program. MET level places him in a moderate level. Will cont. to monitor and motivate as tolerated.     Expected Outcomes  Through exercise at rehab and at home, the patient will decrease shortness of breath with daily activities and feel confident in carrying out an exercise regime at home.    Through exercise at rehab and at home, the patient will decrease shortness of breath with daily activities and feel confident in carrying out an exercise regime at home.   Through exercise at rehab and at home, the patient will decrease shortness of breath with daily activities and feel confident in carrying out an exercise regime at home.   Through exercise at rehab and at home, the patient will decrease shortness of breath with daily activities and feel confident in carrying out an exercise regime at home.        Discharge Exercise Prescription (Final Exercise Prescription Changes): Exercise Prescription Changes - 01/18/18 1200      Response to Exercise   Blood Pressure (Admit)  140/82    Blood Pressure (Exercise)  154/90    Blood Pressure (Exit)  136/76    Heart Rate (Admit)  85 bpm    Heart Rate (Exercise)  102 bpm    Heart Rate (Exit)  83 bpm    Oxygen Saturation (Admit)  97 %    Oxygen Saturation (Exercise)  95 %    Oxygen Saturation (Exit)  97 %    Rating of Perceived Exertion (Exercise)  13    Perceived Dyspnea (Exercise)  2    Duration  Continue with 45 min of aerobic exercise without signs/symptoms of physical distress.    Intensity  THRR unchanged      Progression   Progression  Continue to progress workloads to maintain intensity without signs/symptoms of physical distress.      Resistance Training   Training Prescription  Yes    Weight  blue bands    Reps  10-15    Time  10 Minutes      Interval Training   Interval Training  No      Treadmill   MPH  2.5    Grade  5    Minutes  17      Bike   Level  1.5    Minutes  17      NuStep   Level  5    SPM  80    Minutes  17    METs  3.5       Nutrition:  Target Goals: Understanding of nutrition guidelines, daily intake of sodium '1500mg'$ , cholesterol '200mg'$ , calories 30% from fat and 7% or less from saturated fats, daily to have 5 or more servings of fruits and vegetables.  Biometrics: Pre Biometrics -  10/08/17 1058      Pre Biometrics   Grip Strength  31 kg        Nutrition Therapy Plan and Nutrition Goals: Nutrition Therapy & Goals - 10/28/17 1230      Nutrition Therapy   Diet  General, healthful      Personal Nutrition Goals   Nutrition Goal  Pt to decrease portion size of meat at meals to ~ 3ounces    Personal Goal #2  Pt to add a meatless meal at least 1-2 times/week    Personal Goal #3  Pt to increase consumption of whole grains consumed to at least 1 serving/day, working up to 50% of grains consumed as whole grains      Intervention Plan   Intervention  Prescribe, educate and counsel regarding individualized specific dietary modifications aiming towards targeted core components such as weight, hypertension, lipid management, diabetes, heart failure and other comorbidities.    Expected Outcomes  Short Term Goal: Understand basic principles of dietary content, such as calories, fat, sodium, cholesterol and nutrients.;Long Term Goal: Adherence to prescribed nutrition plan.       Nutrition Assessments: Nutrition Assessments - 12/21/17 1537      Rate Your Plate Scores   Pre Score  47    Post Score  30       Nutrition Goals Re-Evaluation:   Nutrition Goals Discharge (Final Nutrition Goals Re-Evaluation):   Psychosocial: Target Goals: Acknowledge presence or absence of significant depression and/or stress, maximize coping skills, provide positive support system. Participant is able to verbalize types and ability to use techniques and skills needed for reducing stress and depression.  Initial Review & Psychosocial Screening: Initial Psych Review & Screening - 10/08/17 1050      Initial Review   Current issues with  Current Stress Concerns   lovingly cares for his wife with chronic illness.    Source of Stress Concerns  Family    Comments  intermittent mild caregiver stress however patients sister-in-law willingly steps in to care for patient's wife to allow patient  to have some "me time"      [place]?  Yes   church family and friends     Barriers   Psychosocial barriers to participate in program  The patient should benefit from training in stress management and relaxation.      Screening Interventions   Interventions  Encouraged to exercise;Provide feedback about the scores to participant    Expected Outcomes  Short Term goal: Utilizing psychosocial counselor, staff and physician to assist with identification of specific Stressors or current issues interfering with healing process. Setting desired goal for each stressor or current issue identified.;Long Term Goal: Stressors or current issues are controlled or eliminated.       Quality of Life Scores:  Scores of 19 and below usually indicate a poorer quality of life in these areas.  A difference of  2-3 points is a clinically meaningful difference.  A difference of  2-3 points in the total score of the Quality of Life Index has been associated with significant improvement in overall quality of life, self-image, physical symptoms, and general health in studies assessing change in quality of life.  PHQ-9: Recent Review Flowsheet Data    Depression screen [facility] 2/9 12/21/2017 10/08/2017 10/09/2015 07/05/2014   Decreased Interest 0 0 0 0   Down, Depressed, Hopeless 0 0 0 0   PHQ - 2 Score 0 0 0 0     Interpretation of Total Score  Total Score Depression Severity:  1-4 = Minimal depression, 5-9 = Mild depression, 10-14 = Moderate depression, 15-19 = Moderately severe depression, 20-27 = Severe depression   Psychosocial Evaluation and Intervention: Psychosocial Evaluation - 01/18/18 0916      Psychosocial Evaluation & Interventions   Interventions  Encouraged to exercise with the program and follow exercise prescription    Continue Psychosocial Services   Follow up required by staff       Psychosocial Re-Evaluation: Psychosocial Re-Evaluation    [place] Name 11/01/17 1611  11/29/17 1431 12/20/17 1536 01/18/18 0916       Psychosocial Re-Evaluation   Current issues with  Current Stress Concerns  Current Stress Concerns  Current Stress Concerns  Current Stress Concerns    Comments  -  -  Pt enjoys participating in pulmonary rehab.  This gives him an opportunity for time for himself which helps him deal with the ongoing care of his wife.  Pt enjoys participating in pulmonary rehab.  This gives him an opportunity for time for himself which helps him deal with the ongoing care of his wife.    Expected Outcomes  Pt will reach out to other family members when he has caregiver fatigue/stress.  Pt will utilize community resources that offer respite care.  Pt will employ strategies to maintain his health both physically and mentally.  Pt will reach out to other family members when he has caregiver fatigue/stress.  Pt will utilize community resources that offer respite care.  Pt will employ strategies to maintain his health both physically and mentally.  Pt will reach out to other family members when he has caregiver fatigue/stress.  Pt will utilize community resources that offer respite care.  Pt will employ strategies to maintain his health both physically and mentally.  Pt will reach out to other family members when he has caregiver fatigue/stress.  Pt will utilize community resources that offer respite care.  Pt will employ strategies to maintain his health both physically and mentally.    Interventions  Stress management education;Encouraged to attend Pulmonary Rehabilitation for the exercise;Relaxation education  Encouraged to attend Pulmonary Rehabilitation for the exercise;Stress management education;Relaxation education  Encouraged to attend Pulmonary Rehabilitation for the exercise;Stress management education;Relaxation education  Encouraged to attend Pulmonary Rehabilitation for the exercise;Stress management education;Relaxation education    Continue Psychosocial Services    Follow up required by staff  Follow up required by staff  Follow up required by staff  Follow up required by staff    Comments  intermittent mild caregiver stress however patients sister-in-law willingly steps in to care for patient's wife to allow patient to have some "me time"  intermittent mild caregiver stress however patients sister-in-law willingly steps in to care for patient's wife to allow patient to have some "me time"  intermittent caregiver stress however patients sister-in-law willingly steps in to care for patient's wife to allow patient to have some "me time"  intermittent caregiver stress however patients sister-in-law willingly  steps in to care for patient's wife to allow patient to have some "me time"      Initial Review   Source of Stress Concerns  Family  Family  -  Occupation       Psychosocial Discharge (Final Psychosocial Re-Evaluation): Psychosocial Re-Evaluation - 01/18/18 0916      Psychosocial Re-Evaluation   Current issues with  Current Stress Concerns    Comments  Pt enjoys participating in pulmonary rehab.  This gives him an opportunity for time for himself which helps him deal with the ongoing care of his wife.    Expected Outcomes  Pt will reach out to other family members when he has caregiver fatigue/stress.  Pt will utilize community resources that offer respite care.  Pt will employ strategies to maintain his health both physically and mentally.    Interventions  Encouraged to attend Pulmonary Rehabilitation for the exercise;Stress management education;Relaxation education    Continue Psychosocial Services   Follow up required by staff    Comments  intermittent caregiver stress however patients sister-in-law willingly steps in to care for patient's wife to allow patient to have some "me time"      Initial Review   Source of Stress Concerns  Occupation       Education: Education Goals: Education classes will be provided on a weekly basis, covering required  topics. Participant will state understanding/return demonstration of topics presented.  Learning Barriers/Preferences: Learning Barriers/Preferences - 10/08/17 1049      Learning Barriers/Preferences   Learning Barriers  None    Learning Preferences  Skilled Demonstration;Written Material       Education Topics: Risk Factor Reduction:  -Group instruction that is supported by a PowerPoint presentation. Instructor discusses the definition of a risk factor, different risk factors for pulmonary disease, and how the heart and lungs work together.     Nutrition for Pulmonary Patient:  -Group instruction provided by PowerPoint slides, verbal discussion, and written materials to support subject matter. The instructor gives an explanation and review of healthy diet recommendations, which includes a discussion on weight management, recommendations for fruit and vegetable consumption, as well as protein, fluid, caffeine, fiber, sodium, sugar, and alcohol. Tips for eating when patients are short of breath are discussed.   Pursed Lip Breathing:  -Group instruction that is supported by demonstration and informational handouts. Instructor discusses the benefits of pursed lip and diaphragmatic breathing and detailed demonstration on how to preform both.     Oxygen Safety:  -Group instruction provided by PowerPoint, verbal discussion, and written material to support subject matter. There is an overview of "What is Oxygen" and "Why do we need it".  Instructor also reviews how to create a safe environment for oxygen use, the importance of using oxygen as prescribed, and the risks of noncompliance. There is a brief discussion on traveling with oxygen and resources the patient may utilize.   PULMONARY REHAB OTHER RESPIRATORY from 01/13/2018 in [place]  Date  12/16/17  Educator  Cloyde Reams  Instruction Review Code  1- Verbalizes Understanding      Oxygen Equipment:  -Group  instruction provided by [employer] utilizing handouts, written materials, and Insurance underwriter.   Signs and Symptoms:  -Group instruction provided by written material and verbal discussion to support subject matter. Warning signs and symptoms of infection, stroke, and heart attack are reviewed and when to call the physician/911 reinforced. Tips for preventing the spread of infection discussed.   PULMONARY REHAB OTHER  RESPIRATORY from 01/13/2018 in [place]  Date  12/09/17  Educator  Remo Lipps  Instruction Review Code  1- Verbalizes Understanding      Advanced Directives:  -Group instruction provided by verbal instruction and written material to support subject matter. Instructor reviews Advanced Directive laws and proper instruction for filling out document.   Pulmonary Video:  -Group video education that reviews the importance of medication and oxygen compliance, exercise, good nutrition, pulmonary hygiene, and pursed lip and diaphragmatic breathing for the pulmonary patient.   Exercise for the Pulmonary Patient:  -Group instruction that is supported by a PowerPoint presentation. Instructor discusses benefits of exercise, core components of exercise, frequency, duration, and intensity of an exercise routine, importance of utilizing pulse oximetry during exercise, safety while exercising, and options of places to exercise outside of rehab.     Pulmonary Medications:  -Verbally interactive group education provided by instructor with focus on inhaled medications and proper administration.   Anatomy and Physiology of the Respiratory System and Intimacy:  -Group instruction provided by PowerPoint, verbal discussion, and written material to support subject matter. Instructor reviews respiratory cycle and anatomical components of the respiratory system and their functions. Instructor also reviews differences in obstructive and restrictive respiratory  diseases with examples of each. Intimacy, Sex, and Sexuality differences are reviewed with a discussion on how relationships can change when diagnosed with pulmonary disease. Common sexual concerns are reviewed.   PULMONARY REHAB CHRONIC OBSTRUCTIVE PULMONARY DISEASE from 12/09/2017 in [place]  Date  10/28/17  Educator  rn  Instruction Review Code  2- Demonstrated Understanding      MD DAY -A group question and answer session with a medical doctor that allows participants to ask questions that relate to their pulmonary disease state.   PULMONARY REHAB OTHER RESPIRATORY from 01/13/2018 in [place]  Date  01/04/18  Educator  [place]  Instruction Review Code  2- Demonstrated Understanding      OTHER EDUCATION -Group or individual verbal, written, or video instructions that support the educational goals of the pulmonary rehab program.   PULMONARY REHAB CHRONIC OBSTRUCTIVE PULMONARY DISEASE from 12/09/2017 in [place]  Date  11/25/17  Educator  [place]  Instruction Review Code  1- Verbalizes Understanding      Holiday Eating Survival Tips:  -Group instruction provided by PowerPoint slides, verbal discussion, and written materials to support subject matter. The instructor gives patients tips, tricks, and techniques to help them not only survive but enjoy the holidays despite the onslaught of food that accompanies the holidays.   Knowledge Questionnaire Score: Knowledge Questionnaire Score - 12/21/17 1440      Knowledge Questionnaire Score   Post Score  17/18       Core Components/Risk Factors/Patient Goals at Admission: Personal Goals and Risk Factors at Admission - 10/08/17 1050      Core Components/Risk Factors/Patient Goals on Admission    Weight Management  Yes    Intervention  Weight Management/Obesity: Establish reasonable short term and  long term weight goals.;Obesity: Provide education and appropriate resources to help participant work on and attain dietary goals.;Weight Management: Provide education and appropriate resources to help participant work on and attain dietary goals.;Weight Management: Develop a combined nutrition and exercise program designed to reach desired caloric intake, while maintaining appropriate intake of nutrient and fiber, sodium and fats, and appropriate energy expenditure required for the weight  goal.    Expected Outcomes  Short Term: Continue to assess and modify interventions until short term weight is achieved;Long Term: Adherence to nutrition and physical activity/exercise program aimed toward attainment of established weight goal;Weight Maintenance: Understanding of the daily nutrition guidelines, which includes 25-35% calories from fat, 7% or less cal from saturated fats, less than '200mg'$  cholesterol, less than 1.5gm of sodium, & 5 or more servings of fruits and vegetables daily;Weight Loss: Understanding of general recommendations for a balanced deficit meal plan, which promotes 1-2 lb weight loss per week and includes a negative energy balance of [phone] kcal/d;Understanding recommendations for meals to include 15-35% energy as protein, 25-35% energy from fat, 35-60% energy from carbohydrates, less than '200mg'$  of dietary cholesterol, 20-35 gm of total fiber daily;Understanding of distribution of calorie intake throughout the day with the consumption of 4-5 meals/snacks    Improve shortness of breath with ADL's  Yes    Intervention  Provide education, individualized exercise plan and daily activity instruction to help decrease symptoms of SOB with activities of daily living.    Expected Outcomes  Short Term: Improve cardiorespiratory fitness to achieve a reduction of symptoms when performing ADLs;Long Term: Be able to perform more ADLs without symptoms or delay the onset of symptoms       Core  Components/Risk Factors/Patient Goals Review:  Goals and Risk Factor Review    Row Name 11/01/17 1601 11/01/17 1604 11/29/17 1429 12/20/17 1532 01/18/18 0916     Core Components/Risk Factors/Patient Goals Review   Personal Goals Review  Weight Management/Obesity;Improve shortness of breath with ADL's  -  Weight Management/Obesity;Improve shortness of breath with ADL's  Weight Management/Obesity;Improve shortness of breath with ADL's  Weight Management/Obesity;Improve shortness of breath with ADL's   Review  Pt is off to a great start with his participation in pulmonary rehab.  Pt desires   Pt is off to a great start with his participation in pulmonary rehab.  Pt desires to losse weight. During his two week participation in pulmonary rehab, he has lost 1.4 kg.  Pt reports he continues to have shortness of breath but feels it is managed better with PLB and diaphragmatic breaathing. Pt maintains o2 sat in the mid 90's at rest and mid to low 90's on exertion  has lost 3 kg, progressing well, walking on treadmill @ 2.5 mph and 4% grade, level 5 on nustep, and level 1.4 on bicycle,    Kevin Yang has completed 17 exercise sessions.  Pt is nearing the end of his [employer] authorization which is 7/29.  Multiple attempts to contact the [place] for an extension with no sucess.  Pt has lost 3.9 kg, Pt beginning weight on 6/27 was 106.7 to 102.3 on 7/18. Pt is progressing well, walking on treadmill @ 2.5 mph and 4% grade, level 5 on nustep, and level 1.4 on bicycle,    Kevin Yang has completed 21 exercise sessions.  Pt received extension on his authorization from the [place] to complete remaining sessions.    Pt has lost 5.7 kg, Pt beginning weight on 6/27 was 106.7 to 100.5 on 8/15. Pt is progressing well, walking on treadmill @ 2.6 mph and 5% grade, level 5 on nustep, and level 1.4 on bicycle,.  Anticipate pt will continue to showed progress in the next 30 days toward his pulmonary rehab goals.   Expected Outcomes  -  Pt will achieve his desired  weight loss and report improved management of his shortness of breath.  Pt will achieve his  desired weight loss and report improved management of his shortness of breath.  See Admission Goals   See Admission Goals    Row Name 01/19/18 7209             Core Components/Risk Factors/Patient Goals Review   Personal Goals Review  Weight Management/Obesity;Improve shortness of breath with ADL's;Increase knowledge of respiratory medications and ability to use respiratory devices properly.;Develop more efficient breathing techniques such as purse lipped breathing and diaphragmatic breathing and practicing self-pacing with activity.       Expected Outcomes  See Admission Goals/Outcomes          Core Components/Risk Factors/Patient Goals at Discharge (Final Review):  Goals and Risk Factor Review - 01/19/18 0927      Core Components/Risk Factors/Patient Goals Review   Personal Goals Review  Weight Management/Obesity;Improve shortness of breath with ADL's;Increase knowledge of respiratory medications and ability to use respiratory devices properly.;Develop more efficient breathing techniques such as purse lipped breathing and diaphragmatic breathing and practicing self-pacing with activity.    Expected Outcomes  See Admission Goals/Outcomes       ITP Comments: ITP Comments    Row Name 11/03/17 1042 12/20/17 1532 01/19/18 0922       ITP Comments  Dr. Jennet Maduro, Medical Director  Dr. Jennet Maduro, Medical Director  Dr. Jennet Maduro, Medical Director        Comments:  Pt, who is a veteran, has completed 21 exercise sessions since 5/21. Cherre Huger, BSN Cardiac and Training and development officer

## 2018-01-20 ENCOUNTER — Encounter (HOSPITAL_COMMUNITY): Payer: No Typology Code available for payment source

## 2018-01-20 ENCOUNTER — Encounter (HOSPITAL_COMMUNITY)
Admission: RE | Admit: 2018-01-20 | Discharge: 2018-01-20 | Disposition: A | Discharge status: Home / Self Care | Payer: No Typology Code available for payment source | Source: Ambulatory Visit | Attending: Internal Medicine | Admitting: Internal Medicine

## 2018-01-20 DIAGNOSIS — J449 Chronic obstructive pulmonary disease, unspecified: Secondary | ICD-10-CM

## 2018-01-20 NOTE — Progress Notes (Signed)
Daily Session Note  Patient Details  Name: Kevin Yang MRN: [id number] Date of Birth: 01/02/1951 Referring Provider:     Pulmonary Rehab Walk Test from 10/12/2017 in [place]  Referring Provider  Dr. Nelda Marseille      Encounter Date: 01/20/2018  Check In: Session Check In - 01/20/18 1220      Check-In   Supervising physician immediately available to respond to emergencies  [facility] immediately available    Physician(s)  Dr. Algis Liming    Location  [facility]    Staff Present  Su Hilt, MS, ACSM RCEP, Exercise Physiologist;Annedrea Rosezella [place], RN, MHA;Carlette Wilber Oliphant, RN, Roque Cash, RN    Medication changes reported      No    Fall or balance concerns reported     No    Tobacco Cessation  No Change    Warm-up and Cool-down  Performed as group-led instruction    Resistance Training Performed  Yes    VAD Patient?  No    PAD/SET Patient?  No      Pain Assessment   Currently in Pain?  No/denies    Multiple Pain Sites  No       Capillary Blood Glucose: No results found for this or any previous visit (from the past 24 hour(s)).    Social History   Tobacco Use  Smoking Status Former Smoker  . Types: Cigars  . Last attempt to quit: 09/07/1986  . Years since quitting: 31.3  Smokeless Tobacco Never Used    Goals Met:  Exercise tolerated well No report of cardiac concerns or symptoms Strength training completed today  Goals Unmet:  Not Applicable  Comments: Service time is from 1030 to 1215    Dr. Rush Farmer is Medical Director for Pulmonary Rehab at [facility].

## 2018-01-25 ENCOUNTER — Encounter (HOSPITAL_COMMUNITY): Payer: No Typology Code available for payment source

## 2018-01-27 ENCOUNTER — Encounter (HOSPITAL_COMMUNITY): Payer: No Typology Code available for payment source

## 2018-02-01 ENCOUNTER — Encounter (HOSPITAL_COMMUNITY): Payer: No Typology Code available for payment source

## 2018-02-03 ENCOUNTER — Encounter (HOSPITAL_COMMUNITY): Payer: No Typology Code available for payment source

## 2018-02-08 ENCOUNTER — Encounter (HOSPITAL_COMMUNITY): Payer: No Typology Code available for payment source

## 2018-02-11 NOTE — Addendum Note (Signed)
Encounter addended by: Enid Skeens, RD on: 02/11/2018 11:20 AM  Actions taken: Flowsheet data copied forward, Visit Navigator Flowsheet section accepted

## 2018-02-14 NOTE — Progress Notes (Signed)
Discharge Progress Report  Patient Details  Name: JEIDEN DAUGHTRIDGE MRN: [id number] Date of Birth: November 16, 1950 Referring Provider:     Pulmonary Rehab Walk Test from 10/12/2017 in [facility]  Referring Provider  Dr. Nelda Marseille       Number of Visits: 23  Reason for Discharge:  Patient reached a stable level of exercise. Patient independent in their exercise. Patient has met program and personal goals.  Within [place] authorization time period  Smoking History:  Social History   Tobacco Use  Smoking Status Former Smoker  . Types: Cigars  . Last attempt to quit: 09/07/1986  . Years since quitting: 31.4  Smokeless Tobacco Never Used    Diagnosis:  Chronic obstructive pulmonary disease, unspecified COPD type ([place])  ADL [facility]: Pulmonary Assessment Scores    Row Name 10/12/17 1646 12/21/17 1308 12/21/17 1440     ADL [facility]   ADL Phase  Entry  Exit  Exit   SOB Score total  -  -  60     CAT Score   CAT Score  -  -  pre/25    exit/20     mMRC Score   mMRC Score  1  0  -      Initial Exercise Prescription: Initial Exercise Prescription - 10/12/17 1600      Date of Initial Exercise RX and Referring Provider   Date  10/12/17    Referring Provider  Dr. Nelda Marseille      Bike   Level  1    Minutes  17      NuStep   Level  2    SPM  80    Minutes  17    METs  1.5      Track   Laps  10    Minutes  17      Prescription Details   Frequency (times per week)  2    Duration  Progress to 45 minutes of aerobic exercise without signs/symptoms of physical distress      Intensity   THRR 40-80% of Max Heartrate  52-124    Ratings of Perceived Exertion  11-13    Perceived Dyspnea  0-4      Progression   Progression  Continue progressive overload as per policy without signs/symptoms or physical distress.      Resistance Training   Training Prescription  Yes    Weight  blue bands    Reps  10-15       Discharge Exercise Prescription (Final Exercise  Prescription Changes): Exercise Prescription Changes - 01/18/18 1200      Response to Exercise   Blood Pressure (Admit)  140/82    Blood Pressure (Exercise)  154/90    Blood Pressure (Exit)  136/76    Heart Rate (Admit)  85 bpm    Heart Rate (Exercise)  102 bpm    Heart Rate (Exit)  83 bpm    Oxygen Saturation (Admit)  97 %    Oxygen Saturation (Exercise)  95 %    Oxygen Saturation (Exit)  97 %    Rating of Perceived Exertion (Exercise)  13    Perceived Dyspnea (Exercise)  2    Duration  Continue with 45 min of aerobic exercise without signs/symptoms of physical distress.    Intensity  THRR unchanged      Progression   Progression  Continue to progress workloads to maintain intensity without signs/symptoms of physical distress.      Resistance  Training   Training Prescription  Yes    Weight  blue bands    Reps  10-15    Time  10 Minutes      Interval Training   Interval Training  No      Treadmill   MPH  2.5    Grade  5    Minutes  17      Bike   Level  1.5    Minutes  17      NuStep   Level  5    SPM  80    Minutes  17    METs  3.5       Functional Capacity: 6 Minute Walk    Row Name 10/12/17 1641 10/12/17 1646 12/21/17 1305     6 Minute Walk   Phase  Initial  Initial  Discharge   Distance  1600 feet  -  2000 feet   Distance Feet Change  -  -  400 ft   Walk Time  6 minutes  -  6 minutes   # of Rest Breaks  0  -  0   MPH  3.03  -  3.78   METS  3.3  -  3.91   RPE  15  -  13   Perceived Dyspnea   2  -  2   Symptoms  No  -  No   Resting HR  96 bpm  -  82 bpm   Resting BP  138/84  -  124/68   Resting Oxygen Saturation   95 %  -  97 %   Exercise Oxygen Saturation  during 6 min walk  88 %  -  95 %   Max Ex. HR  116 bpm  -  106 bpm   Max Ex. BP  114/84  -  140/80     Interval HR   1 Minute HR  107  -  99   2 Minute HR  108  -  101   3 Minute HR  107  -  103   4 Minute HR  104  -  104   5 Minute HR  114  -  106   6 Minute HR  116  -  105   2 Minute  Post HR  101  -  -   Interval Heart Rate?  Yes  -  Yes     Interval Oxygen   Interval Oxygen?  Yes  -  Yes   Baseline Oxygen Saturation %  95 %  -  97 %   1 Minute Oxygen Saturation %  94 %  -  96 %   1 Minute Liters of Oxygen  0 L  -  0 L   2 Minute Oxygen Saturation %  88 %  -  95 %   2 Minute Liters of Oxygen  0 L  -  0 L   3 Minute Oxygen Saturation %  89 %  -  95 %   3 Minute Liters of Oxygen  0 L  -  0 L   4 Minute Oxygen Saturation %  89 %  -  96 %   4 Minute Liters of Oxygen  0 L  -  0 L   5 Minute Oxygen Saturation %  89 %  -  95 %   5 Minute Liters of Oxygen  0 L  -  0 L   6 Minute Oxygen  Saturation %  91 %  -  96 %   6 Minute Liters of Oxygen  0 L  -  0 L   2 Minute Post Oxygen Saturation %  96 %  -  -   2 Minute Post Liters of Oxygen  0 L  -  -      Psychological, QOL, Others - Outcomes: PHQ 2/9: Depression screen [facility] 2/9 02/14/2018 12/21/2017 10/08/2017 10/09/2015 07/05/2014  Decreased Interest 0 0 0 0 0  Down, Depressed, Hopeless 0 0 0 0 0  PHQ - 2 Score 0 0 0 0 0  Altered sleeping 1 - - - -  Tired, decreased energy 1 - - - -  Change in appetite 0 - - - -  Feeling bad or failure about yourself  0 - - - -  Trouble concentrating 0 - - - -  Moving slowly or fidgety/restless 0 - - - -  Suicidal thoughts 0 - - - -  PHQ-9 Score 2 - - - -  Difficult doing work/chores Not difficult at all - - - -    Quality of Life:   Personal Goals: Goals established at orientation with interventions provided to work toward goal. Personal Goals and Risk Factors at Admission - 10/08/17 1050      Core Components/Risk Factors/Patient Goals on Admission    Weight Management  Yes    Intervention  Weight Management/Obesity: Establish reasonable short term and long term weight goals.;Obesity: Provide education and appropriate resources to help participant work on and attain dietary goals.;Weight Management: Provide education and appropriate resources to help participant work on and attain  dietary goals.;Weight Management: Develop a combined nutrition and exercise program designed to reach desired caloric intake, while maintaining appropriate intake of nutrient and fiber, sodium and fats, and appropriate energy expenditure required for the weight goal.    Expected Outcomes  Short Term: Continue to assess and modify interventions until short term weight is achieved;Long Term: Adherence to nutrition and physical activity/exercise program aimed toward attainment of established weight goal;Weight Maintenance: Understanding of the daily nutrition guidelines, which includes 25-35% calories from fat, 7% or less cal from saturated fats, less than 242m cholesterol, less than 1.5gm of sodium, & 5 or more servings of fruits and vegetables daily;Weight Loss: Understanding of general recommendations for a balanced deficit meal plan, which promotes 1-2 lb weight loss per week and includes a negative energy balance of [phone] kcal/d;Understanding recommendations for meals to include 15-35% energy as protein, 25-35% energy from fat, 35-60% energy from carbohydrates, less than 2037mof dietary cholesterol, 20-35 gm of total fiber daily;Understanding of distribution of calorie intake throughout the day with the consumption of 4-5 meals/snacks    Improve shortness of breath with ADL's  Yes    Intervention  Provide education, individualized exercise plan and daily activity instruction to help decrease symptoms of SOB with activities of daily living.    Expected Outcomes  Short Term: Improve cardiorespiratory fitness to achieve a reduction of symptoms when performing ADLs;Long Term: Be able to perform more ADLs without symptoms or delay the onset of symptoms        Personal Goals Discharge: Goals and Risk Factor Review    Row Name 11/01/17 1601 11/01/17 1604 11/29/17 1429 12/20/17 1532 01/18/18 0916     Core Components/Risk Factors/Patient Goals Review   Personal Goals Review  Weight  Management/Obesity;Improve shortness of breath with ADL's  -  Weight Management/Obesity;Improve shortness of breath with ADL's  Weight Management/Obesity;Improve shortness  of breath with ADL's  Weight Management/Obesity;Improve shortness of breath with ADL's   Review  Pt is off to a great start with his participation in pulmonary rehab.  Pt desires   Pt is off to a great start with his participation in pulmonary rehab.  Pt desires to losse weight. During his two week participation in pulmonary rehab, he has lost 1.4 kg.  Pt reports he continues to have shortness of breath but feels it is managed better with PLB and diaphragmatic breaathing. Pt maintains o2 sat in the mid 90's at rest and mid to low 90's on exertion  has lost 3 kg, progressing well, walking on treadmill @ 2.5 mph and 4% grade, level 5 on nustep, and level 1.4 on bicycle,    Audry Pili has completed 17 exercise sessions.  Pt is nearing the end of his [place] authorization which is 7/29.  Multiple attempts to contact the [place] for an extension with no sucess.  Pt has lost 3.9 kg, Pt beginning weight on 6/27 was 106.7 to 102.3 on 7/18. Pt is progressing well, walking on treadmill @ 2.5 mph and 4% grade, level 5 on nustep, and level 1.4 on bicycle,    Ricky has completed 21 exercise sessions.  Pt received extension on his authorization from the [place] to complete remaining sessions.    Pt has lost 5.7 kg, Pt beginning weight on 6/27 was 106.7 to 100.5 on 8/15. Pt is progressing well, walking on treadmill @ 2.6 mph and 5% grade, level 5 on nustep, and level 1.4 on bicycle,.  Anticipate pt will continue to showed progress in the next 30 days toward his pulmonary rehab goals.   Expected Outcomes  -  Pt will achieve his desired weight loss and report improved management of his shortness of breath.  Pt will achieve his desired weight loss and report improved management of his shortness of breath.  See Admission Goals   See Admission Goals    Row Name 01/19/18 9038 02/14/18  1541           Core Components/Risk Factors/Patient Goals Review   Personal Goals Review  Weight Management/Obesity;Improve shortness of breath with ADL's;Increase knowledge of respiratory medications and ability to use respiratory devices properly.;Develop more efficient breathing techniques such as purse lipped breathing and diaphragmatic breathing and practicing self-pacing with activity.  Weight Management/Obesity;Improve shortness of breath with ADL's;Increase knowledge of respiratory medications and ability to use respiratory devices properly.;Develop more efficient breathing techniques such as purse lipped breathing and diaphragmatic breathing and practicing self-pacing with activity.      Review  -  Pt graduates with the completion of 23 exercise session. Pt has lost a total of 7.0 kg, Pt beginning weight on 6/27 was 106.7 to 99.2 on discharge.  Pt reports less shortness of breath to perform task at home with caring for his wife.  Pt practices PLB effectively and understands how his medication work for him.      Expected Outcomes  See Admission Goals/Outcomes  Pt has met his personal goals and outcomes.         Exercise Goals and Review: Exercise Goals    Row Name 10/08/17 1024             Exercise Goals   Increase Physical Activity  Yes       Intervention  Provide advice, education, support and counseling about physical activity/exercise needs.;Develop an individualized exercise prescription for aerobic and resistive training based on initial evaluation findings, risk stratification, comorbidities  and participant's personal goals.       Expected Outcomes  Short Term: Attend rehab on a regular basis to increase amount of physical activity.;Long Term: Add in home exercise to make exercise part of routine and to increase amount of physical activity.;Long Term: Exercising regularly at least 3-5 days a week.       Increase Strength and Stamina  Yes       Intervention  Provide advice,  education, support and counseling about physical activity/exercise needs.;Develop an individualized exercise prescription for aerobic and resistive training based on initial evaluation findings, risk stratification, comorbidities and participant's personal goals.       Expected Outcomes  Short Term: Increase workloads from initial exercise prescription for resistance, speed, and METs.;Short Term: Perform resistance training exercises routinely during rehab and add in resistance training at home;Long Term: Improve cardiorespiratory fitness, muscular endurance and strength as measured by increased METs and functional capacity (6MWT)       Able to understand and use rate of perceived exertion (RPE) scale  Yes       Intervention  Provide education and explanation on how to use RPE scale       Expected Outcomes  Short Term: Able to use RPE daily in rehab to express subjective intensity level;Long Term:  Able to use RPE to guide intensity level when exercising independently       Able to understand and use Dyspnea scale  Yes       Intervention  Provide education and explanation on how to use Dyspnea scale       Expected Outcomes  Short Term: Able to use Dyspnea scale daily in rehab to express subjective sense of shortness of breath during exertion;Long Term: Able to use Dyspnea scale to guide intensity level when exercising independently       Knowledge and understanding of Target Heart Rate Range (THRR)  Yes       Intervention  Provide education and explanation of THRR including how the numbers were predicted and where they are located for reference       Expected Outcomes  Short Term: Able to state/look up THRR;Short Term: Able to use daily as guideline for intensity in rehab;Long Term: Able to use THRR to govern intensity when exercising independently       Understanding of Exercise Prescription  Yes       Intervention  Provide education, explanation, and written materials on patient's individual exercise  prescription       Expected Outcomes  Short Term: Able to explain program exercise prescription;Long Term: Able to explain home exercise prescription to exercise independently          Nutrition & Weight - Outcomes: Pre Biometrics - 10/08/17 1058      Pre Biometrics   Grip Strength  31 kg        Nutrition: Nutrition Therapy & Goals - 10/28/17 1230      Nutrition Therapy   Diet  General, healthful      Personal Nutrition Goals   Nutrition Goal  Pt to decrease portion size of meat at meals to ~ 3ounces    Personal Goal #2  Pt to add a meatless meal at least 1-2 times/week    Personal Goal #3  Pt to increase consumption of whole grains consumed to at least 1 serving/day, working up to 50% of grains consumed as whole grains      Intervention Plan   Intervention  Prescribe, educate and counsel regarding individualized specific  dietary modifications aiming towards targeted core components such as weight, hypertension, lipid management, diabetes, heart failure and other comorbidities.    Expected Outcomes  Short Term Goal: Understand basic principles of dietary content, such as calories, fat, sodium, cholesterol and nutrients.;Long Term Goal: Adherence to prescribed nutrition plan.       Nutrition Discharge: Nutrition Assessments - 12/21/17 1537      Rate Your Plate Scores   Pre Score  47    Post Score  30       Education Questionnaire Score: Knowledge Questionnaire Score - 12/21/17 1440      Knowledge Questionnaire Score   Post Score  17/18       Goals reviewed with patient.  Cherre Huger, BSN Cardiac and Training and development officer

## 2018-02-14 NOTE — Addendum Note (Signed)
Encounter addended by: Chelsea Aus, RN on: 02/14/2018 3:48 PM  Actions taken: Episode resolved, Flowsheet data copied forward, Visit Navigator Flowsheet section accepted, Sign clinical note

## 2019-07-11 ENCOUNTER — Encounter (HOSPITAL_COMMUNITY): Payer: Self-pay | Admitting: Emergency Medicine

## 2019-07-11 ENCOUNTER — Emergency Department (HOSPITAL_COMMUNITY)
Admission: EM | Admit: 2019-07-11 | Discharge: 2019-07-12 | Disposition: A | Discharge status: Home / Self Care | Payer: No Typology Code available for payment source | Attending: Emergency Medicine | Admitting: Emergency Medicine

## 2019-07-11 ENCOUNTER — Other Ambulatory Visit: Payer: Self-pay

## 2019-07-11 ENCOUNTER — Emergency Department (HOSPITAL_COMMUNITY): Payer: No Typology Code available for payment source

## 2019-07-11 DIAGNOSIS — J449 Chronic obstructive pulmonary disease, unspecified: Secondary | ICD-10-CM | POA: Diagnosis not present

## 2019-07-11 DIAGNOSIS — R0789 Other chest pain: Secondary | ICD-10-CM | POA: Diagnosis present

## 2019-07-11 DIAGNOSIS — E669 Obesity, unspecified: Secondary | ICD-10-CM | POA: Diagnosis not present

## 2019-07-11 DIAGNOSIS — Z6831 Body mass index (BMI) 31.0-31.9, adult: Secondary | ICD-10-CM | POA: Diagnosis not present

## 2019-07-11 DIAGNOSIS — Z79899 Other long term (current) drug therapy: Secondary | ICD-10-CM | POA: Diagnosis not present

## 2019-07-11 DIAGNOSIS — Z87891 Personal history of nicotine dependence: Secondary | ICD-10-CM | POA: Insufficient documentation

## 2019-07-11 HISTORY — DX: Noninfective gastroenteritis and colitis, unspecified: K52.9

## 2019-07-11 LAB — CBC
HCT: 39.9 % (ref 39.0–52.0)
Hemoglobin: 12.5 g/dL — ABNORMAL LOW (ref 13.0–17.0)
MCH: 29.7 pg (ref 26.0–34.0)
MCHC: 31.3 g/dL (ref 30.0–36.0)
MCV: 94.8 fL (ref 80.0–100.0)
Platelets: 223 10*3/uL (ref 150–400)
RBC: 4.21 MIL/uL — ABNORMAL LOW (ref 4.22–5.81)
RDW: 12.7 % (ref 11.5–15.5)
WBC: 8.7 10*3/uL (ref 4.0–10.5)
nRBC: 0 % (ref 0.0–0.2)

## 2019-07-11 LAB — BASIC METABOLIC PANEL
Anion gap: 11 (ref 5–15)
BUN: 14 mg/dL (ref 8–23)
CO2: 27 mmol/L (ref 22–32)
Calcium: 9.1 mg/dL (ref 8.9–10.3)
Chloride: 104 mmol/L (ref 98–111)
Creatinine, Ser: 0.96 mg/dL (ref 0.61–1.24)
GFR calc Af Amer: >60 mL/min (ref >60)
GFR calc non Af Amer: >60 mL/min (ref >60)
Glucose, Bld: 203 mg/dL — ABNORMAL HIGH (ref 70–99)
Potassium: 3.3 mmol/L — ABNORMAL LOW (ref 3.5–5.1)
Sodium: 142 mmol/L (ref 135–145)

## 2019-07-11 LAB — TROPONIN I (HIGH SENSITIVITY): Troponin I (High Sensitivity): 6 ng/L (ref <18)

## 2019-07-11 MED ORDER — SODIUM CHLORIDE 0.9% FLUSH: 3.0000 mL | Freq: Once | INTRAVENOUS | Status: DC

## 2019-07-11 NOTE — ED Triage Notes (Addendum)
Pt to ED with c/o mid chest pain that woke him from sleep.  EMS reports pt burped a couple of times and it helped the pain.  EMS gave pt NTG SL x's 2 and pt took ASA 324mg   Pt st's he had hot wings and pizza for dinner tonight

## 2019-07-12 LAB — TROPONIN I (HIGH SENSITIVITY): Troponin I (High Sensitivity): 8 ng/L (ref <18)

## 2019-07-12 MED ORDER — POTASSIUM CHLORIDE CRYS ER 20 MEQ PO TBCR
40.0000 meq | EXTENDED_RELEASE_TABLET | Freq: Once | ORAL | Status: AC
  Administered 2019-07-12: 40 meq via ORAL
  Filled 2019-07-12: qty 2

## 2019-07-12 MED ORDER — MAGNESIUM OXIDE 400 (241.3 MG) MG PO TABS
800.0000 mg | ORAL_TABLET | Freq: Once | ORAL | Status: AC
  Administered 2019-07-12: 800 mg via ORAL
  Filled 2019-07-12: qty 2

## 2019-07-12 NOTE — ED Provider Notes (Signed)
[facility] EMERGENCY DEPARTMENT Provider Note   CSN: [id number] Arrival date & time: 07/11/19  2040     History Chief Complaint  Patient presents with  . Chest Pain    Kevin Yang is a 69 y.o. male.  69 yo M with a cc of chest pain.  Describes this as a dull ache to the center of his chest.  Occurred when he went to sleep after having a large dinner.  Pain was at max 8.  He did get diaphoretic and short of breath with it.  Improved in route with EMS and then resolved completely prior to my exam.  Not exertional.  History of hyperlipidemia.  Denies hypertension diabetes smoking or family history of MI.  Denies history of PE or DVT denies unilateral lower extremity edema denies hemoptysis,  recent surgery, immobilization, cancer or testosterone use.  The history is provided by the patient.  Chest Pain Pain location:  Substernal area Pain quality: dull   Pain radiates to:  Does not radiate Pain severity:  Moderate Onset quality:  Gradual Duration:  2 days Timing:  Constant Progression:  Worsening Chronicity:  New Relieved by:  Nothing Worsened by:  Nothing Ineffective treatments:  None tried Associated symptoms: no abdominal pain, no fever, no headache, no palpitations, no shortness of breath and no vomiting        Past Medical History:  Diagnosis Date  . Asthma   . Colitis   . COPD (chronic obstructive pulmonary disease) (HCC)   . Crohn's disease [facility])     Patient Active Problem List   Diagnosis Date Noted  . RHUS DERMATITIS 11/04/2009  . HYPERCHOLESTEROLEMIA 04/09/2009  . TOBACCO USE, QUIT 04/09/2009  . ACUTE MAXILLARY SINUSITIS 08/25/2007  . EXTERNAL HEMORRHOIDS, THROMBOSED 07/28/2007  . Asthma with acute exacerbation 05/02/2007  . 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 History of colonic polyps 05/02/2007  . HEADACHE 02/11/2007    Past Surgical History:  Procedure Laterality Date  . BACK SURGERY    . COLON SURGERY    . SHOULDER  SURGERY         No family history on file.  Social History   Tobacco Use  . Smoking status: Former Smoker    Types: Cigars    Quit date: 09/07/1986    Years since quitting: 32.8  . Smokeless tobacco: Never Used  Substance Use Topics  . Alcohol use: No    Alcohol/week: 0.0 standard drinks  . Drug use: No    Home Medications Prior to Admission medications   Medication Sig Start Date End Date Taking? Authorizing Provider  albuterol (PROVENTIL HFA;VENTOLIN HFA) 108 (90 BASE) MCG/ACT inhaler Inhale 2 puffs into the lungs every 6 (six) hours as needed for wheezing or shortness of breath.    Yes [provider]  budesonide-formoterol (SYMBICORT) 160-4.5 MCG/ACT inhaler Inhale 2 puffs into the lungs 2 (two) times daily.   Yes [facility]  fluticasone (FLONASE) 50 MCG/ACT nasal spray Place 1 spray into both nostrils as needed. 2 sprays each nostril once a day 09/22/11  Yes [provider]  formoterol (PERFOROMIST) 20 MCG/2ML nebulizer solution Take 20 mcg by nebulization 4 (four) times daily.   Yes [provider]  inFLIXimab-abda 100 MG SOLR Inject into the vein every 6 (six) weeks.    Yes [facility]  ipratropium-albuterol (DUONEB) 0.5-2.5 (3) MG/3ML SOLN Inhale 3 mLs into the lungs every 4 (four) hours as needed (for wheezing).    Yes [provider]  simvastatin (ZOCOR) 80 MG tablet Take 80 mg by mouth at bedtime.   Yes [provider]  tamsulosin (FLOMAX) 0.4 MG CAPS Take 0.4 mg by mouth at bedtime.   Yes [provider]  sildenafil (VIAGRA) 50 MG tablet Take 1 tablet (50 mg total) by mouth as needed for erectile dysfunction. Patient not taking: Reported on 07/12/2019 10/09/15   Gordy Savers, MD    Allergies    Penicillins  Review of Systems   Review of Systems  Constitutional: Negative for chills and fever.  HENT: Negative for congestion and facial swelling.   Eyes: Negative for discharge and visual  disturbance.  Respiratory: Negative for shortness of breath.   Cardiovascular: Positive for chest pain. Negative for palpitations.  Gastrointestinal: Negative for abdominal pain, diarrhea and vomiting.  Musculoskeletal: Negative for arthralgias and myalgias.  Skin: Negative for color change and rash.  Neurological: Negative for tremors, syncope and headaches.  Psychiatric/Behavioral: Negative for confusion and dysphoric mood.    Physical Exam Updated Vital Signs BP (!) 144/92   Pulse 72   Temp 98.3 F (36.8 C) (Oral)   Resp 16   Ht 5\' 10"  (1.778 m)   Wt 99.8 kg   SpO2 100%   BMI 31.57 kg/m   Physical Exam Vitals and nursing note reviewed.  Constitutional:      Appearance: He is well-developed. He is obese.  HENT:     Head: Normocephalic and atraumatic.  Eyes:     Pupils: Pupils are equal, round, and reactive to light.  Neck:     Vascular: No JVD.  Cardiovascular:     Rate and Rhythm: Normal rate and regular rhythm.     Heart sounds: No murmur. No friction rub. No gallop.   Pulmonary:     Effort: No respiratory distress.     Breath sounds: No wheezing.  Abdominal:     General: There is no distension.     Tenderness: There is no guarding or rebound.     Comments: Large lower midline abdominal scar  Musculoskeletal:        General: Normal range of motion.     Cervical back: Normal range of motion and neck supple.  Skin:    Coloration: Skin is not pale.     Findings: No rash.  Neurological:     Mental Status: He is alert and oriented to person, place, and time.  Psychiatric:        Behavior: Behavior normal.     ED Results / Procedures / Treatments   Labs (all labs ordered are listed, but only abnormal results are displayed) Labs Reviewed  BASIC METABOLIC PANEL - Abnormal; Notable for the following components:      Result Value   Potassium 3.3 (*)    Glucose, Bld 203 (*)    All other components within normal limits  CBC - Abnormal; Notable for the  following components:   RBC 4.21 (*)    Hemoglobin 12.5 (*)    All other components within normal limits  TROPONIN I (HIGH SENSITIVITY)  TROPONIN I (HIGH SENSITIVITY)    EKG EKG Interpretation  Date/Time:  Wednesday July 12 2019 02:12:42 EST Ventricular Rate:  78 PR Interval:    QRS Duration: 90 QT Interval:  386 QTC Calculation: 440 R Axis:   51 Text Interpretation: Sinus rhythm No significant change since last tracing Confirmed by 08-10-1991 [phone]) on 07/12/2019 2:14:30 AM   Radiology DG Chest 2 View  Result  Date: 07/11/2019 CLINICAL DATA:  Chest pain EXAM: CHEST - 2 VIEW COMPARISON:  None. FINDINGS: The heart size and mediastinal contours are within normal limits. Left basilar atelectasis. Both lungs are clear. The visualized skeletal structures are unremarkable. IMPRESSION: No active cardiopulmonary disease. Electronically Signed   By: Ulyses Jarred M.D.   On: 07/11/2019 21:15    Procedures Procedures (including critical care time)  Medications Ordered in ED Medications  sodium chloride flush (NS) 0.9 % injection 3 mL (has no administration in time range)  sodium chloride flush (NS) 0.9 % injection 3 mL (has no administration in time range)  potassium chloride SA (KLOR-CON) CR tablet 40 mEq (40 mEq Oral Given 07/12/19 0425)  magnesium oxide (MAG-OX) tablet 800 mg (800 mg Oral Given 07/12/19 0425)    ED Course  I have reviewed the triage vital signs and the nursing notes.  Pertinent labs & imaging results that were available during my care of the patient were reviewed by me and considered in my medical decision making (see chart for details).    MDM Rules/Calculators/A&P                      69 yo M with a chief complaint of chest pain.  This is atypical in nature and has resolved prior to my exam.  EKG without concerning finding his delta troponin is negative.  Most likely the patient has reflux by history of a large meal with alcohol and then laying back flat and  having symptoms.  We will have him try Zantac or Pepcid at home.  PCP and GI follow-up.  4:27 AM:  I have discussed the diagnosis/risks/treatment options with the patient and believe the pt to be eligible for discharge home to follow-up with PCP. We also discussed returning to the ED immediately if new or worsening sx occur. We discussed the sx which are most concerning (e.g., sudden worsening pain, fever, inability to tolerate by mouth) that necessitate immediate return. Medications administered to the patient during their visit and any new prescriptions provided to the patient are listed below.  Medications given during this visit Medications  sodium chloride flush (NS) 0.9 % injection 3 mL (has no administration in time range)  sodium chloride flush (NS) 0.9 % injection 3 mL (has no administration in time range)  potassium chloride SA (KLOR-CON) CR tablet 40 mEq (40 mEq Oral Given 07/12/19 0425)  magnesium oxide (MAG-OX) tablet 800 mg (800 mg Oral Given 07/12/19 0425)     The patient appears reasonably screen and/or stabilized for discharge and I doubt any other medical condition or other [facility] requiring further screening, evaluation, or treatment in the ED at this time prior to discharge.   Final Clinical Impression(s) / ED Diagnoses Final diagnoses:  Atypical chest pain    Rx / DC Orders ED Discharge Orders    None       Deno Etienne, DO 07/12/19 [phone]

## 2019-07-12 NOTE — Discharge Instructions (Signed)
Try zantac or pepcid twice a day.  Try to avoid things that may make this worse, most commonly these are spicy foods tomato based products fatty foods chocolate and peppermint.  Alcohol and tobacco can also make this worse.  Return to the emergency department for sudden worsening pain fever or inability to eat or drink. ° °

## 2020-04-23 ENCOUNTER — Ambulatory Visit: Payer: No Typology Code available for payment source | Attending: Internal Medicine

## 2020-04-23 DIAGNOSIS — Z23 Encounter for immunization: Secondary | ICD-10-CM

## 2020-04-23 NOTE — Progress Notes (Signed)
   Covid-19 Vaccination Clinic  Name:  LASARO PRIMM    MRN: [id number] DOB: 13-Sep-1950  04/23/2020  Mr. Sobotka was observed post Covid-19 immunization for 15 minutes without incident. He was provided with Vaccine Information Sheet and instruction to access the V-Safe system.   Mr. Schaller was instructed to call 911 with any severe reactions post vaccine: Marland Kitchen Difficulty breathing  . Swelling of face and throat  . A fast heartbeat  . A bad rash all over body  . Dizziness and weakness   Immunizations Administered    No immunizations on file.

## 2021-02-13 ENCOUNTER — Telehealth (HOSPITAL_COMMUNITY): Payer: Self-pay

## 2021-02-13 ENCOUNTER — Encounter (HOSPITAL_COMMUNITY): Payer: Self-pay | Admitting: *Deleted

## 2021-02-13 NOTE — Progress Notes (Signed)
Received pulmonary rehab authorization [id number] from [place] for this veteran to participate in Pulmonary rehab with the diagnosis of COPD.  Pt who is known to pulmonary rehab staff from previous participation in 2019.  Will contact [facility] care in the community department to please send clinical follow up notes for review.  Due to wait list, this veteran can be added while waiting for the [facility] to send clinic notes. Will forward to support staff. Alanson Aly, BSN Cardiac and Emergency planning/management officer

## 2021-02-13 NOTE — Telephone Encounter (Signed)
Returned pt phone call in regards to pulmonary rehab. I advised pt that we have received his pulmonary authorization from the [place]. I advised pt that we have a1-3 month backlog right now with pulmonary rehab and we would contact him at a later date to get him scheduled.

## 2021-03-18 ENCOUNTER — Telehealth (HOSPITAL_COMMUNITY): Payer: Self-pay

## 2021-03-18 NOTE — Telephone Encounter (Signed)
Pt called and wanted to know if we still had a backlog and how much more longer. Adv pt we still have a backlog of 1-4 months. Pt ask if there was any other location he was able to attend. Provide pt [facility] and [facility] phone number for [place].

## 2021-03-18 NOTE — Telephone Encounter (Signed)
Pt call back and stated [facility] will be able to get him in sooner and he would like for [place] to fax his VA referral to them. Adv pt I would and close his [facility] referral here at [facility].

## 2021-03-19 ENCOUNTER — Encounter: Payer: Self-pay | Admitting: *Deleted

## 2021-03-19 ENCOUNTER — Encounter: Payer: No Typology Code available for payment source | Attending: Critical Care Medicine | Admitting: *Deleted

## 2021-03-19 ENCOUNTER — Other Ambulatory Visit: Payer: Self-pay

## 2021-03-19 DIAGNOSIS — H9190 Unspecified hearing loss, unspecified ear: Secondary | ICD-10-CM | POA: Insufficient documentation

## 2021-03-19 DIAGNOSIS — G4733 Obstructive sleep apnea (adult) (pediatric): Secondary | ICD-10-CM | POA: Insufficient documentation

## 2021-03-19 DIAGNOSIS — K219 Gastro-esophageal reflux disease without esophagitis: Secondary | ICD-10-CM | POA: Insufficient documentation

## 2021-03-19 DIAGNOSIS — R0609 Other forms of dyspnea: Secondary | ICD-10-CM

## 2021-03-19 DIAGNOSIS — N400 Enlarged prostate without lower urinary tract symptoms: Secondary | ICD-10-CM | POA: Insufficient documentation

## 2021-03-19 DIAGNOSIS — R0600 Dyspnea, unspecified: Secondary | ICD-10-CM | POA: Insufficient documentation

## 2021-03-19 DIAGNOSIS — Z9189 Other specified personal risk factors, not elsewhere classified: Secondary | ICD-10-CM | POA: Insufficient documentation

## 2021-03-19 DIAGNOSIS — J449 Chronic obstructive pulmonary disease, unspecified: Secondary | ICD-10-CM | POA: Insufficient documentation

## 2021-03-19 NOTE — Progress Notes (Signed)
Initial telephone orientation completed. Diagnosis can be found in CE 8/15. EP orientation scheduled for Monday 10/31 at 8am.

## 2021-03-24 ENCOUNTER — Encounter: Payer: No Typology Code available for payment source | Admitting: *Deleted

## 2021-03-24 ENCOUNTER — Other Ambulatory Visit: Payer: Self-pay

## 2021-03-24 VITALS — Ht 69.5 in | Wt 219.2 lb

## 2021-03-24 DIAGNOSIS — R0600 Dyspnea, unspecified: Secondary | ICD-10-CM | POA: Diagnosis present

## 2021-03-24 DIAGNOSIS — R0609 Other forms of dyspnea: Secondary | ICD-10-CM

## 2021-03-24 NOTE — Patient Instructions (Signed)
Patient Instructions  Patient Details  Name: Kevin Yang MRN: [id number] Date of Birth: August 20, 1950 Referring Provider:  [place], [place] Ryan, DO  Below are your personal goals for exercise, nutrition, and risk factors. Our goal is to help you stay on track towards obtaining and maintaining these goals. We will be discussing your progress on these goals with you throughout the program.  Initial Exercise Prescription:  Initial Exercise Prescription - 03/24/21 1400       Date of Initial Exercise RX and Referring Provider   Date 03/24/21    Referring Provider [place], [place] MD      Oxygen   Maintain Oxygen Saturation 88% or higher      Treadmill   MPH 2.2    Grade 0.5    Minutes 15    METs 2.84      Recumbant Bike   Level 2    RPM 50    Watts 26    Minutes 15    METs 2.8      REL-XR   Level 2    Speed 50    Minutes 15    METs 2.8      T5 Nustep   Level 2    SPM 80    Minutes 15    METs 2.8      Prescription Details   Frequency (times per week) 3    Duration Progress to 30 minutes of continuous aerobic without signs/symptoms of physical distress      Intensity   THRR 40-80% of Max Heartrate 116-140    Ratings of Perceived Exertion 11-13    Perceived Dyspnea 0-4      Progression   Progression Continue to progress workloads to maintain intensity without signs/symptoms of physical distress.      Resistance Training   Training Prescription Yes    Weight 3 lb    Reps 10-15             Exercise Goals: Frequency: Be able to perform aerobic exercise two to three times per week in program working toward 2-5 days per week of home exercise.  Intensity: Work with a perceived exertion of 11 (fairly light) - 15 (hard) while following your exercise prescription.  We will make changes to your prescription with you as you progress through the program.   Duration: Be able to do 30 to 45 minutes of continuous aerobic exercise in addition to a 5 minute warm-up and a 5  minute cool-down routine.   Nutrition Goals: Your personal nutrition goals will be established when you do your nutrition analysis with the dietician.  The following are general nutrition guidelines to follow: Cholesterol < 200mg /day Sodium < 1500mg /day Fiber: Men over 50 yrs - 30 grams per day  Personal Goals:  Personal Goals and Risk Factors at Admission - 03/24/21 1423       Core Components/Risk Factors/Patient Goals on Admission    Weight Management Yes;Weight Loss;Obesity    Intervention Weight Management: Develop a combined nutrition and exercise program designed to reach desired caloric intake, while maintaining appropriate intake of nutrient and fiber, sodium and fats, and appropriate energy expenditure required for the weight goal.;Weight Management: Provide education and appropriate resources to help participant work on and attain dietary goals.;Weight Management/Obesity: Establish reasonable short term and long term weight goals.;Obesity: Provide education and appropriate resources to help participant work on and attain dietary goals.    Admit Weight 219 lb 3.2 oz (99.4 kg)    Goal Weight: Short Term  214 lb (97.1 kg)    Goal Weight: Long Term 209 lb (94.8 kg)    Expected Outcomes Short Term: Continue to assess and modify interventions until short term weight is achieved;Long Term: Adherence to nutrition and physical activity/exercise program aimed toward attainment of established weight goal;Weight Maintenance: Understanding of the daily nutrition guidelines, which includes 25-35% calories from fat, 7% or less cal from saturated fats, less than 200mg  cholesterol, less than 1.5gm of sodium, & 5 or more servings of fruits and vegetables daily;Understanding recommendations for meals to include 15-35% energy as protein, 25-35% energy from fat, 35-60% energy from carbohydrates, less than 200mg  of dietary cholesterol, 20-35 gm of total fiber daily;Understanding of distribution of calorie  intake throughout the day with the consumption of 4-5 meals/snacks    Improve shortness of breath with ADL's Yes    Intervention Provide education, individualized exercise plan and daily activity instruction to help decrease symptoms of SOB with activities of daily living.    Expected Outcomes Short Term: Improve cardiorespiratory fitness to achieve a reduction of symptoms when performing ADLs;Long Term: Be able to perform more ADLs without symptoms or delay the onset of symptoms    Increase knowledge of respiratory medications and ability to use respiratory devices properly  Yes    Intervention Provide education and demonstration as needed of appropriate use of medications, inhalers, and oxygen therapy.    Expected Outcomes Short Term: Achieves understanding of medications use. Understands that oxygen is a medication prescribed by physician. Demonstrates appropriate use of inhaler and oxygen therapy.;Long Term: Maintain appropriate use of medications, inhalers, and oxygen therapy.             Tobacco Use Initial Evaluation: Social History   Tobacco Use  Smoking Status Former   Types: Cigars   Quit date: 09/07/1986   Years since quitting: 34.5  Smokeless Tobacco Never    Exercise Goals and Review:  Exercise Goals     Row Name 03/24/21 1422             Exercise Goals   Increase Physical Activity Yes       Intervention Provide advice, education, support and counseling about physical activity/exercise needs.;Develop an individualized exercise prescription for aerobic and resistive training based on initial evaluation findings, risk stratification, comorbidities and participant's personal goals.       Expected Outcomes Short Term: Attend rehab on a regular basis to increase amount of physical activity.;Long Term: Add in home exercise to make exercise part of routine and to increase amount of physical activity.;Long Term: Exercising regularly at least 3-5 days a week.       Increase  Strength and Stamina Yes       Intervention Provide advice, education, support and counseling about physical activity/exercise needs.;Develop an individualized exercise prescription for aerobic and resistive training based on initial evaluation findings, risk stratification, comorbidities and participant's personal goals.       Expected Outcomes Short Term: Increase workloads from initial exercise prescription for resistance, speed, and METs.;Short Term: Perform resistance training exercises routinely during rehab and add in resistance training at home;Long Term: Improve cardiorespiratory fitness, muscular endurance and strength as measured by increased METs and functional capacity (11/07/1986)       Able to understand and use rate of perceived exertion (RPE) scale Yes       Intervention Provide education and explanation on how to use RPE scale       Expected Outcomes Short Term: Able to use RPE daily in rehab  to express subjective intensity level;Long Term:  Able to use RPE to guide intensity level when exercising independently       Able to understand and use Dyspnea scale Yes       Intervention Provide education and explanation on how to use Dyspnea scale       Expected Outcomes Short Term: Able to use Dyspnea scale daily in rehab to express subjective sense of shortness of breath during exertion;Long Term: Able to use Dyspnea scale to guide intensity level when exercising independently       Knowledge and understanding of Target Heart Rate Range (THRR) Yes       Intervention Provide education and explanation of THRR including how the numbers were predicted and where they are located for reference       Expected Outcomes Short Term: Able to state/look up THRR;Short Term: Able to use daily as guideline for intensity in rehab;Long Term: Able to use THRR to govern intensity when exercising independently       Able to check pulse independently Yes       Intervention Provide education and demonstration on how  to check pulse in carotid and radial arteries.;Review the importance of being able to check your own pulse for safety during independent exercise       Expected Outcomes Short Term: Able to explain why pulse checking is important during independent exercise;Long Term: Able to check pulse independently and accurately       Understanding of Exercise Prescription Yes       Intervention Provide education, explanation, and written materials on patient's individual exercise prescription       Expected Outcomes Short Term: Able to explain program exercise prescription;Long Term: Able to explain home exercise prescription to exercise independently                Copy of goals given to participant.

## 2021-03-24 NOTE — Progress Notes (Signed)
Pulmonary Individual Treatment Plan  Patient Details  Name: Kevin Yang MRN: [id number] Date of Birth: 04-Jun-1950 Referring Provider:   Flowsheet Row Pulmonary Rehab from 03/24/2021 in [facility] Cardiac and Pulmonary Rehab  Referring Provider Marion, [place] MD       Initial Encounter Date:  Flowsheet Row Pulmonary Rehab from 03/24/2021 in [facility] Cardiac and Pulmonary Rehab  Date 03/24/21       Visit Diagnosis: Dyspnea on exertion  Patient's Home Medications on Admission:  Current Outpatient Medications:    albuterol (PROVENTIL HFA;VENTOLIN HFA) 108 (90 BASE) MCG/ACT inhaler, Inhale 2 puffs into the lungs every 6 (six) hours as needed for wheezing or shortness of breath. , Disp: , Rfl:    atorvastatin (LIPITOR) 80 MG tablet, TAKE ONE TABLET BY MOUTH AT BEDTIME FOR CHOLESTEROL, Disp: , Rfl:    Benzoyl Peroxide 10 % LIQD, WASH AS DIRECTED AFFECTED AREA DAILY : APPLY TO THE SCALP, CHEST AND BACK DAILY IN THE SHOWER. RINSE OFF COMPLETELY. DRY OFF WITH WHITE TOWELS SINCE THIS WASH CAN BLEACH COLORS, Disp: , Rfl:    budesonide-formoterol (SYMBICORT) 160-4.5 MCG/ACT inhaler, Inhale 2 puffs into the lungs 2 (two) times daily., Disp: , Rfl:    Carboxymethylcellulose Sodium 1 % GEL, APPLY 1 DROP TO EACH EYE FOUR TIMES A DAY, Disp: , Rfl:    cetirizine (ZYRTEC) 10 MG tablet, Take 1 tablet by mouth daily., Disp: , Rfl:    diclofenac Sodium (VOLTAREN) 1 % GEL, APPLY 4 GRAMS TO AFFECTED AREA FOUR TIMES A DAY AS NEEDED, Disp: , Rfl:    dicyclomine (BENTYL) 10 MG capsule, TAKE ONE CAPSULE BY MOUTH TWICE A DAY BEFORE MEALS, Disp: , Rfl:    fluticasone (FLONASE) 50 MCG/ACT nasal spray, Place 1 spray into both nostrils as needed. 2 sprays each nostril once a day, Disp: , Rfl:    formoterol (PERFOROMIST) 20 MCG/2ML nebulizer solution, Take 20 mcg by nebulization 4 (four) times daily. (Patient not taking: Reported on 03/19/2021), Disp: , Rfl:    HYDROcodone-acetaminophen (NORCO) 10-325 MG tablet, TAKE ONE-HALF  TO ONE TABLET BY MOUTH TWICE A DAY AS NEEDED (9/65M,10/32M), Disp: , Rfl:    inFLIXimab-abda 100 MG SOLR, Inject into the vein every 6 (six) weeks.  (Patient not taking: Reported on 03/19/2021), Disp: , Rfl:    ipratropium-albuterol (DUONEB) 0.5-2.5 (3) MG/3ML SOLN, Inhale 3 mLs into the lungs every 4 (four) hours as needed (for wheezing).  (Patient not taking: Reported on 03/19/2021), Disp: , Rfl:    lisinopril (ZESTRIL) 10 MG tablet, Take 1 tablet by mouth daily., Disp: , Rfl:    loperamide (IMODIUM) 2 MG capsule, TAKE ONE CAPSULE BY MOUTH AS DIRECTED BY YOUR PROVIDER AS NEEDED    AFTER EACH LOOSE STOOL NO MORE THAN 4 TIMES PER DAY, Disp: , Rfl:    naloxone (NARCAN) nasal spray 4 mg/0.1 mL, SPRAY 1 SPRAY INTO ONE NOSTRIL AS DIRECTED FOR OPIOID OVERDOSE (TURN PERSON ON SIDE AFTER DOSE. IF NO RESPONSE IN 2-3 MINUTES OR PERSON RESPONDS BUT RELAPSES, REPEAT USING A NEW SPRAY DEVICE AND SPRAY INTO THE OTHER NOSTRIL. CALL 911 AFTER USE.) * EMERGENCY USE ONLY *, Disp: , Rfl:    omeprazole (PRILOSEC) 40 MG capsule, TAKE ONE CAPSULE BY MOUTH DAILY (TAKE ON AN EMPTY STOMACH 30 MINUTES PRIOR TO A MEAL), Disp: , Rfl:    predniSONE (DELTASONE) 10 MG tablet, TAKE TWO TABLETS BY MOUTH IN THE MORNING -CONTINUE THE 20 MG DAILY UNTIL FURTHER INSTRUCTED (TAKE WITH FOOD), Disp: , Rfl:  sildenafil (VIAGRA) 50 MG tablet, Take 1 tablet (50 mg total) by mouth as needed for erectile dysfunction. (Patient not taking: No sig reported), Disp: 10 tablet, Rfl: 2   simvastatin (ZOCOR) 80 MG tablet, Take 80 mg by mouth at bedtime. (Patient not taking: Reported on 03/19/2021), Disp: , Rfl:    tamsulosin (FLOMAX) 0.4 MG CAPS, Take 0.4 mg by mouth at bedtime., Disp: , Rfl:   Past Medical History: Past Medical History:  Diagnosis Date   Asthma    Colitis    COPD (chronic obstructive pulmonary disease) (HCC)    Crohn's disease (HCC)     Tobacco Use: Social History   Tobacco Use  Smoking Status Former   Types: Cigars    Quit date: 09/07/1986   Years since quitting: 34.5  Smokeless Tobacco Never    Labs: Recent Review Flowsheet Data     Labs for ITP Cardiac and Pulmonary Rehab Latest Ref Rng & Units 04/22/2007 04/07/2009 04/08/2009 10/09/2015   Cholestrol 0 - 200 mg/dL 253(GU) - 440 .Marland Kitchen.(H) 282(H)   LDLCALC 0 - 99 mg/dL - - 347 .Marland Kitchen.(H) 189(H)   LDLDIRECT mg/dL 425.9 - - -   HDL >56.38 mg/dL 37.3(L) - 40 56.90   Trlycerides 0.0 - 149.0 mg/dL 756 - 433 295.0(H)   TCO2 0 - 100 mmol/L - 30 - -        Pulmonary Assessment Scores:  Pulmonary Assessment Scores     Row Name 03/24/21 1424         ADL UCSD   ADL Phase Entry     SOB Score total 90     Rest 2     Walk 4     Stairs 5     Bath 3     Dress 4     Shop 3       CAT Score   CAT Score 31       mMRC Score   mMRC Score 1              UCSD: Self-administered rating of dyspnea associated with activities of daily living (ADLs) 6-point scale (0 = "not at all" to 5 = "maximal or unable to do because of breathlessness")  Scoring Scores range from 0 to 120.  Minimally important difference is 5 units  CAT: CAT can identify the health impairment of COPD patients and is better correlated with disease progression.  CAT has a scoring range of zero to 40. The CAT score is classified into four groups of low (less than 10), medium (10 - 20), high (21-30) and very high (31-40) based on the impact level of disease on health status. A CAT score over 10 suggests significant symptoms.  A worsening CAT score could be explained by an exacerbation, poor medication adherence, poor inhaler technique, or progression of COPD or comorbid conditions.  CAT MCID is 2 points  mMRC: mMRC (Modified Medical Research Council) Dyspnea Scale is used to assess the degree of baseline functional disability in patients of respiratory disease due to dyspnea. No minimal important difference is established. A decrease in score of 1 point or greater is considered a positive  change.   Pulmonary Function Assessment:   Exercise Target Goals: Exercise Program Goal: Individual exercise prescription set using results from initial 6 min walk test and THRR while considering  patient's activity barriers and safety.   Exercise Prescription Goal: Initial exercise prescription builds to 30-45 minutes a day of aerobic activity, 2-3 days per week.  Home  exercise guidelines will be given to patient during program as part of exercise prescription that the participant will acknowledge.  Education: Aerobic Exercise: - Group verbal and visual presentation on the components of exercise prescription. Introduces F.I.T.T principle from ACSM for exercise prescriptions.  Reviews F.I.T.T. principles of aerobic exercise including progression. Written material given at graduation. Flowsheet Row Pulmonary Rehab from 03/24/2021 in [facility] Cardiac and Pulmonary Rehab  Education need identified 03/24/21       Education: Resistance Exercise: - Group verbal and visual presentation on the components of exercise prescription. Introduces F.I.T.T principle from ACSM for exercise prescriptions  Reviews F.I.T.T. principles of resistance exercise including progression. Written material given at graduation.    Education: Exercise & Equipment Safety: - Individual verbal instruction and demonstration of equipment use and safety with use of the equipment. Flowsheet Row Pulmonary Rehab from 03/24/2021 in [facility] Cardiac and Pulmonary Rehab  Date 03/24/21  Educator [facility]  Instruction Review Code 1- Verbalizes Understanding       Education: Exercise Physiology & General Exercise Guidelines: - Group verbal and written instruction with models to review the exercise physiology of the cardiovascular system and associated critical values. Provides general exercise guidelines with specific guidelines to those with heart or lung disease.    Education: Flexibility, Balance, Mind/Body Relaxation: - Group verbal  and visual presentation with interactive activity on the components of exercise prescription. Introduces F.I.T.T principle from ACSM for exercise prescriptions. Reviews F.I.T.T. principles of flexibility and balance exercise training including progression. Also discusses the mind body connection.  Reviews various relaxation techniques to help reduce and manage stress (i.e. Deep breathing, progressive muscle relaxation, and visualization). Balance handout provided to take home. Written material given at graduation.   Activity Barriers & Risk Stratification:  Activity Barriers & Cardiac Risk Stratification - 03/24/21 1420       Activity Barriers & Cardiac Risk Stratification   Activity Barriers Shortness of Breath;Back Problems;Deconditioning             6 Minute Walk:  6 Minute Walk     Row Name 03/24/21 1416         6 Minute Walk   Phase Initial     Distance 1282 feet     Walk Time 6 minutes     # of Rest Breaks 0     MPH 2.43     METS 2.84     RPE 16     Perceived Dyspnea  3     VO2 Peak 9.96     Symptoms Yes (comment)     Comments SOB     Resting HR 92 bpm     Resting BP 134/66     Resting Oxygen Saturation  92 %     Exercise Oxygen Saturation  during 6 min walk 84 %     Max Ex. HR 105 bpm     Max Ex. BP 138/74     2 Minute Post BP 138/70       Interval HR   1 Minute HR 104     2 Minute HR 103     3 Minute HR 97     4 Minute HR 105     5 Minute HR 89     6 Minute HR 90     2 Minute Post HR 98     Interval Heart Rate? Yes       Interval Oxygen   Interval Oxygen? Yes     Baseline Oxygen Saturation % 92 %  1 Minute Oxygen Saturation % 85 %     1 Minute Liters of Oxygen 0 L  Room Air     2 Minute Oxygen Saturation % 84 %     2 Minute Liters of Oxygen 0 L     3 Minute Oxygen Saturation % 85 %     3 Minute Liters of Oxygen 0 L     4 Minute Oxygen Saturation % 84 %     4 Minute Liters of Oxygen 0 L     5 Minute Oxygen Saturation % 85 %     5 Minute  Liters of Oxygen 0 L     6 Minute Oxygen Saturation % 87 %     6 Minute Liters of Oxygen 0 L     2 Minute Post Oxygen Saturation % 94 %     2 Minute Post Liters of Oxygen 0 L             Oxygen Initial Assessment:  Oxygen Initial Assessment - 03/19/21 1035       Home Oxygen   Home Oxygen Device None    Sleep Oxygen Prescription CPAP    Home Exercise Oxygen Prescription None    Home Resting Oxygen Prescription None      Initial 6 min Walk   Oxygen Used None      Program Oxygen Prescription   Program Oxygen Prescription None      Intervention   Short Term Goals To learn and understand importance of maintaining oxygen saturations>88%;To learn and demonstrate proper pursed lip breathing techniques or other breathing techniques. ;To learn and demonstrate proper use of respiratory medications;To learn and understand importance of monitoring SPO2 with pulse oximeter and demonstrate accurate use of the pulse oximeter.    Long  Term Goals Verbalizes importance of monitoring SPO2 with pulse oximeter and return demonstration;Maintenance of O2 saturations>88%;Exhibits proper breathing techniques, such as pursed lip breathing or other method taught during program session;Compliance with respiratory medication             Oxygen Re-Evaluation:   Oxygen Discharge (Final Oxygen Re-Evaluation):   Initial Exercise Prescription:  Initial Exercise Prescription - 03/24/21 1400       Date of Initial Exercise RX and Referring Provider   Date 03/24/21    Referring Provider Marion, [place] MD      Oxygen   Maintain Oxygen Saturation 88% or higher      Treadmill   MPH 2.2    Grade 0.5    Minutes 15    METs 2.84      Recumbant Bike   Level 2    RPM 50    Watts 26    Minutes 15    METs 2.8      REL-XR   Level 2    Speed 50    Minutes 15    METs 2.8      T5 Nustep   Level 2    SPM 80    Minutes 15    METs 2.8      Prescription Details   Frequency (times per week) 3     Duration Progress to 30 minutes of continuous aerobic without signs/symptoms of physical distress      Intensity   THRR 40-80% of Max Heartrate 116-140    Ratings of Perceived Exertion 11-13    Perceived Dyspnea 0-4      Progression   Progression Continue to progress workloads to maintain intensity without signs/symptoms of physical  distress.      Resistance Training   Training Prescription Yes    Weight 3 lb    Reps 10-15             Perform Capillary Blood Glucose checks as needed.  Exercise Prescription Changes:   Exercise Comments:   Exercise Goals and Review:   Exercise Goals     Row Name 03/24/21 1422             Exercise Goals   Increase Physical Activity Yes       Intervention Provide advice, education, support and counseling about physical activity/exercise needs.;Develop an individualized exercise prescription for aerobic and resistive training based on initial evaluation findings, risk stratification, comorbidities and participant's personal goals.       Expected Outcomes Short Term: Attend rehab on a regular basis to increase amount of physical activity.;Long Term: Add in home exercise to make exercise part of routine and to increase amount of physical activity.;Long Term: Exercising regularly at least 3-5 days a week.       Increase Strength and Stamina Yes       Intervention Provide advice, education, support and counseling about physical activity/exercise needs.;Develop an individualized exercise prescription for aerobic and resistive training based on initial evaluation findings, risk stratification, comorbidities and participant's personal goals.       Expected Outcomes Short Term: Increase workloads from initial exercise prescription for resistance, speed, and METs.;Short Term: Perform resistance training exercises routinely during rehab and add in resistance training at home;Long Term: Improve cardiorespiratory fitness, muscular endurance and strength  as measured by increased METs and functional capacity ( )       Able to understand and use rate of perceived exertion (RPE) scale Yes       Intervention Provide education and explanation on how to use RPE scale       Expected Outcomes Short Term: Able to use RPE daily in rehab to express subjective intensity level;Long Term:  Able to use RPE to guide intensity level when exercising independently       Able to understand and use Dyspnea scale Yes       Intervention Provide education and explanation on how to use Dyspnea scale       Expected Outcomes Short Term: Able to use Dyspnea scale daily in rehab to express subjective sense of shortness of breath during exertion;Long Term: Able to use Dyspnea scale to guide intensity level when exercising independently       Knowledge and understanding of Target Heart Rate Range (THRR) Yes       Intervention Provide education and explanation of THRR including how the numbers were predicted and where they are located for reference       Expected Outcomes Short Term: Able to state/look up THRR;Short Term: Able to use daily as guideline for intensity in rehab;Long Term: Able to use THRR to govern intensity when exercising independently       Able to check pulse independently Yes       Intervention Provide education and demonstration on how to check pulse in carotid and radial arteries.;Review the importance of being able to check your own pulse for safety during independent exercise       Expected Outcomes Short Term: Able to explain why pulse checking is important during independent exercise;Long Term: Able to check pulse independently and accurately       Understanding of Exercise Prescription Yes       Intervention Provide education, explanation, and written  materials on patient's individual exercise prescription       Expected Outcomes Short Term: Able to explain program exercise prescription;Long Term: Able to explain home exercise prescription to exercise  independently                Exercise Goals Re-Evaluation :   Discharge Exercise Prescription (Final Exercise Prescription Changes):   Nutrition:  Target Goals: Understanding of nutrition guidelines, daily intake of sodium 1500mg , cholesterol 200mg , calories 30% from fat and 7% or less from saturated fats, daily to have 5 or more servings of fruits and vegetables.  Education: All About Nutrition: -Group instruction provided by verbal, written material, interactive activities, discussions, models, and posters to present general guidelines for heart healthy nutrition including fat, fiber, MyPlate, the role of sodium in heart healthy nutrition, utilization of the nutrition label, and utilization of this knowledge for meal planning. Follow up email sent as well. Written material given at graduation.   Biometrics:  Pre Biometrics - 03/24/21 1422       Pre Biometrics   Height 5' 9.5" (1.765 m)    Weight 219 lb 3.2 oz (99.4 kg)    BMI (Calculated) 31.92    Single Leg Stand 30 seconds              Nutrition Therapy Plan and Nutrition Goals:  Nutrition Therapy & Goals - 03/24/21 1423       Intervention Plan   Intervention Prescribe, educate and counsel regarding individualized specific dietary modifications aiming towards targeted core components such as weight, hypertension, lipid management, diabetes, heart failure and other comorbidities.    Expected Outcomes Short Term Goal: Understand basic principles of dietary content, such as calories, fat, sodium, cholesterol and nutrients.;Long Term Goal: Adherence to prescribed nutrition plan.;Short Term Goal: A plan has been developed with personal nutrition goals set during dietitian appointment.             Nutrition Assessments:  MEDIFICTS Score Key: ?70 Need to make dietary changes  40-70 Heart Healthy Diet ? 40 Therapeutic Level Cholesterol Diet  Flowsheet Row Pulmonary Rehab from 03/24/2021 in [facility] Cardiac and  Pulmonary Rehab  Picture Your Plate Total Score on Admission 42      Picture Your Plate Scores: <16 Unhealthy dietary pattern with much room for improvement. 41-50 Dietary pattern unlikely to meet recommendations for good health and room for improvement. 51-60 More healthful dietary pattern, with some room for improvement.  >60 Healthy dietary pattern, although there may be some specific behaviors that could be improved.   Nutrition Goals Re-Evaluation:   Nutrition Goals Discharge (Final Nutrition Goals Re-Evaluation):   Psychosocial: Target Goals: Acknowledge presence or absence of significant depression and/or stress, maximize coping skills, provide positive support system. Participant is able to verbalize types and ability to use techniques and skills needed for reducing stress and depression.   Education: Stress, Anxiety, and Depression - Group verbal and visual presentation to define topics covered.  Reviews how body is impacted by stress, anxiety, and depression.  Also discusses healthy ways to reduce stress and to treat/manage anxiety and depression.  Written material given at graduation.   Education: Sleep Hygiene -Provides group verbal and written instruction about how sleep can affect your health.  Define sleep hygiene, discuss sleep cycles and impact of sleep habits. Review good sleep hygiene tips.    Initial Review & Psychosocial Screening:  Initial Psych Review & Screening - 03/19/21 1041       Initial Review   Current issues  with Current Sleep Concerns      Family Dynamics   Good Support System? Yes   church family, friends     Barriers   Psychosocial barriers to participate in program There are no identifiable barriers or psychosocial needs.;The patient should benefit from training in stress management and relaxation.      Screening Interventions   Interventions Encouraged to exercise;Provide feedback about the scores to participant;To provide support and  resources with identified psychosocial needs    Expected Outcomes Short Term goal: Utilizing psychosocial counselor, staff and physician to assist with identification of specific Stressors or current issues interfering with healing process. Setting desired goal for each stressor or current issue identified.;Long Term Goal: Stressors or current issues are controlled or eliminated.;Short Term goal: Identification and review with participant of any Quality of Life or Depression concerns found by scoring the questionnaire.;Long Term goal: The participant improves quality of Life and PHQ9 Scores as seen by post scores and/or verbalization of changes             Quality of Life Scores:  Scores of 19 and below usually indicate a poorer quality of life in these areas.  A difference of  2-3 points is a clinically meaningful difference.  A difference of 2-3 points in the total score of the Quality of Life Index has been associated with significant improvement in overall quality of life, self-image, physical symptoms, and general health in studies assessing change in quality of life.  PHQ-9: Recent Review Flowsheet Data     Depression screen [facility] 2/9 03/24/2021 02/14/2018 12/21/2017 10/08/2017 10/09/2015   Decreased Interest 3 0 0 0 0   Down, Depressed, Hopeless 2 0 0 0 0   PHQ - 2 Score 5 0 0 0 0   Altered sleeping 3 1 - - -   Tired, decreased energy 3 1 - - -   Change in appetite 1 0 - - -   Feeling bad or failure about yourself  0 0 - - -   Trouble concentrating 1 0 - - -   Moving slowly or fidgety/restless 0 0 - - -   Suicidal thoughts 0 0 - - -   PHQ-9 Score 13 2 - - -   Difficult doing work/chores Very difficult Not difficult at all - - -      Interpretation of Total Score  Total Score Depression Severity:  1-4 = Minimal depression, 5-9 = Mild depression, 10-14 = Moderate depression, 15-19 = Moderately severe depression, 20-27 = Severe depression   Psychosocial Evaluation and  Intervention:  Psychosocial Evaluation - 03/19/21 1042       Psychosocial Evaluation & Interventions   Interventions Encouraged to exercise with the program and follow exercise prescription    Comments Kevin Yang is coming to Pulmonary Rehab after worsening dyspnea. He has done the program before at [facility]. He is very motivated to work hard and tries not to let his health deter him. He relies on his church family and friends. He does report issues sleeping where he only gets about 4 hours of sleep before his back starts to bother him. He usually will get up and nap later during the day. Recently his breathing has gotten worse and his diverticulitis has come back. He is on medication to help with both and is hoping he can repair the hit his stamina has taken.    Expected Outcomes Short: attend pulmonary rehab for education and exercise. Long: develop and maintain positive self care  habtis.    Continue Psychosocial Services  Follow up required by staff             Psychosocial Re-Evaluation:   Psychosocial Discharge (Final Psychosocial Re-Evaluation):   Education: Education Goals: Education classes will be provided on a weekly basis, covering required topics. Participant will state understanding/return demonstration of topics presented.  Learning Barriers/Preferences:  Learning Barriers/Preferences - 03/19/21 1041       Learning Barriers/Preferences   Learning Barriers None    Learning Preferences Skilled Demonstration;Written Material             General Pulmonary Education Topics:  Infection Prevention: - Provides verbal and written material to individual with discussion of infection control including proper hand washing and proper equipment cleaning during exercise session. Flowsheet Row Pulmonary Rehab from 03/24/2021 in [facility] Cardiac and Pulmonary Rehab  Date 03/24/21  Educator [facility]  Instruction Review Code 1- Verbalizes Understanding       Falls Prevention: -  Provides verbal and written material to individual with discussion of falls prevention and safety. Flowsheet Row Pulmonary Rehab from 03/24/2021 in [facility] Cardiac and Pulmonary Rehab  Date 03/24/21  Educator [facility]  Instruction Review Code 1- Verbalizes Understanding       Chronic Lung Disease Review: - Group verbal instruction with posters, models, PowerPoint presentations and videos,  to review new updates, new respiratory medications, new advancements in procedures and treatments. Providing information on websites and "800" numbers for continued self-education. Includes information about supplement oxygen, available portable oxygen systems, continuous and intermittent flow rates, oxygen safety, concentrators, and Medicare reimbursement for oxygen. Explanation of Pulmonary Drugs, including class, frequency, complications, importance of spacers, rinsing mouth after steroid MDI's, and proper cleaning methods for nebulizers. Review of basic lung anatomy and physiology related to function, structure, and complications of lung disease. Review of risk factors. Discussion about methods for diagnosing sleep apnea and types of masks and machines for OSA. Includes a review of the use of types of environmental controls: home humidity, furnaces, filters, dust mite/pet prevention, HEPA vacuums. Discussion about weather changes, air quality and the benefits of nasal washing. Instruction on Warning signs, infection symptoms, calling MD promptly, preventive modes, and value of vaccinations. Review of effective airway clearance, coughing and/or vibration techniques. Emphasizing that all should Create an Action Plan. Written material given at graduation. Flowsheet Row Pulmonary Rehab from 03/24/2021 in [facility] Cardiac and Pulmonary Rehab  Education need identified 03/24/21       AED/CPR: - Group verbal and written instruction with the use of models to demonstrate the basic use of the AED with the basic ABC's of  resuscitation.    Anatomy and Cardiac Procedures: - Group verbal and visual presentation and models provide information about basic cardiac anatomy and function. Reviews the testing methods done to diagnose heart disease and the outcomes of the test results. Describes the treatment choices: Medical Management, Angioplasty, or Coronary Bypass Surgery for treating various heart conditions including Myocardial Infarction, Angina, Valve Disease, and Cardiac Arrhythmias.  Written material given at graduation.   Medication Safety: - Group verbal and visual instruction to review commonly prescribed medications for heart and lung disease. Reviews the medication, class of the drug, and side effects. Includes the steps to properly store meds and maintain the prescription regimen.  Written material given at graduation.   Other: -Provides group and verbal instruction on various topics (see comments) Flowsheet Row PULMONARY REHAB CHRONIC OBSTRUCTIVE PULMONARY DISEASE from 12/09/2017 in [facility] CARDIAC REHAB  Date 11/25/17  Educator Jeannetta Ellis  [facility]]  Instruction Review Code 1- Verbalizes Understanding       Knowledge Questionnaire Score:  Knowledge Questionnaire Score - 03/24/21 1423       Knowledge Questionnaire Score   Pre Score 15/18              Core Components/Risk Factors/Patient Goals at Admission:  Personal Goals and Risk Factors at Admission - 03/24/21 1423       Core Components/Risk Factors/Patient Goals on Admission    Weight Management Yes;Weight Loss;Obesity    Intervention Weight Management: Develop a combined nutrition and exercise program designed to reach desired caloric intake, while maintaining appropriate intake of nutrient and fiber, sodium and fats, and appropriate energy expenditure required for the weight goal.;Weight Management: Provide education and appropriate resources to help participant work on and attain  dietary goals.;Weight Management/Obesity: Establish reasonable short term and long term weight goals.;Obesity: Provide education and appropriate resources to help participant work on and attain dietary goals.    Admit Weight 219 lb 3.2 oz (99.4 kg)    Goal Weight: Short Term 214 lb (97.1 kg)    Goal Weight: Long Term 209 lb (94.8 kg)    Expected Outcomes Short Term: Continue to assess and modify interventions until short term weight is achieved;Long Term: Adherence to nutrition and physical activity/exercise program aimed toward attainment of established weight goal;Weight Maintenance: Understanding of the daily nutrition guidelines, which includes 25-35% calories from fat, 7% or less cal from saturated fats, less than  cholesterol, less than 1.5gm of sodium, & 5 or more servings of fruits and vegetables daily;Understanding recommendations for meals to include 15-35% energy as protein, 25-35% energy from fat, 35-60% energy from carbohydrates, less than  of dietary cholesterol, 20-35 gm of total fiber daily;Understanding of distribution of calorie intake throughout the day with the consumption of 4-5 meals/snacks    Improve shortness of breath with ADL's Yes    Intervention Provide education, individualized exercise plan and daily activity instruction to help decrease symptoms of SOB with activities of daily living.    Expected Outcomes Short Term: Improve cardiorespiratory fitness to achieve a reduction of symptoms when performing ADLs;Long Term: Be able to perform more ADLs without symptoms or delay the onset of symptoms    Increase knowledge of respiratory medications and ability to use respiratory devices properly  Yes    Intervention Provide education and demonstration as needed of appropriate use of medications, inhalers, and oxygen therapy.    Expected Outcomes Short Term: Achieves understanding of medications use. Understands that oxygen is a medication prescribed by physician.  Demonstrates appropriate use of inhaler and oxygen therapy.;Long Term: Maintain appropriate use of medications, inhalers, and oxygen therapy.             Education:Diabetes - Individual verbal and written instruction to review signs/symptoms of diabetes, desired ranges of glucose level fasting, after meals and with exercise. Acknowledge that pre and post exercise glucose checks will be done for 3 sessions at entry of program.   Know Your Numbers and Heart Failure: - Group verbal and visual instruction to discuss disease risk factors for cardiac and pulmonary disease and treatment options.  Reviews associated critical values for Overweight/Obesity, Hypertension, Cholesterol, and Diabetes.  Discusses basics of heart failure: signs/symptoms and treatments.  Introduces Heart Failure Zone chart for action plan for heart failure.  Written material given at graduation.   Core Components/Risk Factors/Patient Goals Review:    Core Components/Risk Factors/Patient Goals at  Discharge (Final Review):    ITP Comments:  ITP Comments     Row Name 03/19/21 1051 03/24/21 1416         ITP Comments Initial telephone orientation completed. Diagnosis can be found in CE 8/15. EP orientation scheduled for Monday 10/31 at 8am. Completed and gym orientation. Initial ITP created and sent for review to Dr. Jinny Sanders, Medical Director.               Comments: Initial ITP

## 2021-03-26 ENCOUNTER — Other Ambulatory Visit: Payer: Self-pay

## 2021-03-26 DIAGNOSIS — R0609 Other forms of dyspnea: Secondary | ICD-10-CM

## 2021-03-26 DIAGNOSIS — R0600 Dyspnea, unspecified: Secondary | ICD-10-CM | POA: Diagnosis not present

## 2021-03-26 NOTE — Progress Notes (Signed)
Daily Session Note  Patient Details  Name: Kevin Yang MRN: [id number] Date of Birth: Dec 30, 1950 Referring Provider:   Flowsheet Row Pulmonary Rehab from 03/24/2021 in [facility] Cardiac and Pulmonary Rehab  Referring Provider Marion, [place] MD       Encounter Date: 03/26/2021  Check In:  Session Check In - 03/26/21 0722       Check-In   Supervising physician immediately available to respond to emergencies See telemetry face sheet for immediately available ER MD    Location [facility]-Cardiac & Pulmonary Rehab    Staff Present Birdie Sons, MPA, RN;Amanda Oletta Darter, BA, ACSM CEP, Exercise Physiologist;Joseph Tessie Fass, [place]    Virtual Visit No    Medication changes reported     No    Fall or balance concerns reported    No    Tobacco Cessation No Change    Warm-up and Cool-down Performed on first and last piece of equipment    Resistance Training Performed Yes    VAD Patient? No    PAD/SET Patient? No      Pain Assessment   Currently in Pain? No/denies                Social History   Tobacco Use  Smoking Status Former   Types: Cigars   Quit date: 09/07/1986   Years since quitting: 34.5  Smokeless Tobacco Never    Goals Met:  Independence with exercise equipment Exercise tolerated well No report of concerns or symptoms today Strength training completed today  Goals Unmet:  Not Applicable  Comments: First full day of exercise!  Patient was oriented to gym and equipment including functions, settings, policies, and procedures.  Patient's individual exercise prescription and treatment plan were reviewed.  All starting workloads were established based on the results of the 6 minute walk test done at initial orientation visit.  The plan for exercise progression was also introduced and progression will be customized based on patient's performance and goals.    Dr. Emily Filbert is Medical Director for [place].  Dr. Ottie Glazier is Medical  Director for [facility] Pulmonary Rehabilitation.

## 2021-03-27 DIAGNOSIS — R0609 Other forms of dyspnea: Secondary | ICD-10-CM

## 2021-03-27 DIAGNOSIS — R0600 Dyspnea, unspecified: Secondary | ICD-10-CM | POA: Diagnosis not present

## 2021-03-27 NOTE — Progress Notes (Signed)
Daily Session Note  Patient Details  Name: Kevin Yang MRN: [id number] Date of Birth: [birth year] Referring Provider:   Flowsheet Row Pulmonary Rehab from 03/24/2021 in [facility] Cardiac and Pulmonary Rehab  Referring Provider Marion, [place] MD       Encounter Date: 03/27/2021  Check In:  Session Check In - 03/27/21 0721       Check-In   Supervising physician immediately available to respond to emergencies See telemetry face sheet for immediately available ER MD    Location [facility]-Cardiac & Pulmonary Rehab    Staff Present Birdie Sons, MPA, Mauricia Area, BS, ACSM CEP, Exercise Physiologist;Joseph Tessie Fass, [place]    Virtual Visit No    Medication changes reported     No    Fall or balance concerns reported    No    Tobacco Cessation No Change    Warm-up and Cool-down Performed on first and last piece of equipment    Resistance Training Performed Yes    VAD Patient? No    PAD/SET Patient? No      Pain Assessment   Currently in Pain? No/denies                Social History   Tobacco Use  Smoking Status Former   Types: Cigars   Quit date: 09/07/1986   Years since quitting: 34.5  Smokeless Tobacco Never    Goals Met:  Independence with exercise equipment Exercise tolerated well No report of concerns or symptoms today Strength training completed today  Goals Unmet:  Not Applicable  Comments:  Pt able to follow exercise prescription today without complaint.  Will continue to monitor for progression.     Dr. Emily Filbert is Medical Director for [place].  Dr. Ottie Glazier is Medical Director for [facility] Pulmonary Rehabilitation.

## 2021-03-31 ENCOUNTER — Encounter: Payer: No Typology Code available for payment source | Admitting: *Deleted

## 2021-03-31 ENCOUNTER — Other Ambulatory Visit: Payer: Self-pay

## 2021-03-31 DIAGNOSIS — R0609 Other forms of dyspnea: Secondary | ICD-10-CM

## 2021-03-31 DIAGNOSIS — R0600 Dyspnea, unspecified: Secondary | ICD-10-CM | POA: Diagnosis not present

## 2021-03-31 NOTE — Progress Notes (Signed)
Daily Session Note  Patient Details  Name: Kevin Yang MRN: [id number] Date of Birth: 1951-01-01 Referring Provider:   Flowsheet Row Pulmonary Rehab from 03/24/2021 in [facility] Cardiac and Pulmonary Rehab  Referring Provider Marion, [place] MD       Encounter Date: 03/31/2021  Check In:  Session Check In - 03/31/21 0807       Check-In   Supervising physician immediately available to respond to emergencies See telemetry face sheet for immediately available ER MD    Location [facility]-Cardiac & Pulmonary Rehab    Staff Present Heath Lark, RN, BSN, Laveda Norman, BS, ACSM CEP, Exercise Physiologist;Joseph [place], [place]    Virtual Visit No    Medication changes reported     No    Fall or balance concerns reported    No    Warm-up and Cool-down Performed on first and last piece of equipment    Resistance Training Performed Yes    VAD Patient? No    PAD/SET Patient? No      Pain Assessment   Currently in Pain? No/denies                Social History   Tobacco Use  Smoking Status Former   Types: Cigars   Quit date: 09/07/1986   Years since quitting: 34.5  Smokeless Tobacco Never    Goals Met:  Proper associated with RPD/PD & O2 Sat Independence with exercise equipment Exercise tolerated well No report of concerns or symptoms today  Goals Unmet:  Not Applicable  Comments: Pt able to follow exercise prescription today without complaint.  Will continue to monitor for progression.    Dr. Emily Filbert is Medical Director for [place].  Dr. Ottie Glazier is Medical Director for [facility] Pulmonary Rehabilitation.

## 2021-04-02 ENCOUNTER — Other Ambulatory Visit: Payer: Self-pay

## 2021-04-02 ENCOUNTER — Encounter: Payer: Self-pay | Admitting: *Deleted

## 2021-04-02 ENCOUNTER — Encounter: Payer: No Typology Code available for payment source | Attending: Critical Care Medicine

## 2021-04-02 DIAGNOSIS — R0609 Other forms of dyspnea: Secondary | ICD-10-CM

## 2021-04-02 NOTE — Progress Notes (Signed)
Daily Session Note  Patient Details  Name: Kevin Yang MRN: [id number] Date of Birth: [birth year] Referring Provider:   Flowsheet Row Pulmonary Rehab from 03/24/2021 in [facility] Cardiac and Pulmonary Rehab  Referring Provider Marion, [place] MD       Encounter Date: 04/02/2021  Check In:  Session Check In - 04/02/21 0731       Check-In   Supervising physician immediately available to respond to emergencies See telemetry face sheet for immediately available ER MD    Location [facility]-Cardiac & Pulmonary Rehab    Staff Present Birdie Sons, MPA, Elveria Rising, BA, ACSM CEP, Exercise Physiologist;Joseph Tessie Fass, [place]    Virtual Visit No    Medication changes reported     No    Fall or balance concerns reported    No    Tobacco Cessation No Change    Warm-up and Cool-down Performed on first and last piece of equipment    Resistance Training Performed Yes    VAD Patient? No    PAD/SET Patient? No      Pain Assessment   Currently in Pain? No/denies                Social History   Tobacco Use  Smoking Status Former   Types: Cigars   Quit date: 09/07/1986   Years since quitting: 34.5  Smokeless Tobacco Never    Goals Met:  Independence with exercise equipment Exercise tolerated well No report of concerns or symptoms today Strength training completed today  Goals Unmet:  Not Applicable  Comments: Pt able to follow exercise prescription today without complaint.  Will continue to monitor for progression.  Reviewed home exercise with pt today.  Pt plans to walk and use treadmill at home for exercise.  He also has a total gym and pedal machine that he can use.  Reviewed THR, pulse, RPE, sign and symptoms, pulse oximetery and when to call 911 or MD.  Also discussed weather considerations and indoor options.  Pt voiced understanding.  Dr. Emily Filbert is Medical Director for [place].  Dr. Ottie Glazier is Medical Director for [facility]  Pulmonary Rehabilitation.

## 2021-04-02 NOTE — Progress Notes (Signed)
Pulmonary Individual Treatment Plan  Patient Details  Name: Kevin Yang MRN: [id number] Date of Birth: 04-Jun-1950 Referring Provider:   Flowsheet Row Pulmonary Rehab from 03/24/2021 in [facility] Cardiac and Pulmonary Rehab  Referring Provider Marion, [place] MD       Initial Encounter Date:  Flowsheet Row Pulmonary Rehab from 03/24/2021 in [facility] Cardiac and Pulmonary Rehab  Date 03/24/21       Visit Diagnosis: Dyspnea on exertion  Patient's Home Medications on Admission:  Current Outpatient Medications:    albuterol (PROVENTIL HFA;VENTOLIN HFA) 108 (90 BASE) MCG/ACT inhaler, Inhale 2 puffs into the lungs every 6 (six) hours as needed for wheezing or shortness of breath. , Disp: , Rfl:    atorvastatin (LIPITOR) 80 MG tablet, TAKE ONE TABLET BY MOUTH AT BEDTIME FOR CHOLESTEROL, Disp: , Rfl:    Benzoyl Peroxide 10 % LIQD, WASH AS DIRECTED AFFECTED AREA DAILY : APPLY TO THE SCALP, CHEST AND BACK DAILY IN THE SHOWER. RINSE OFF COMPLETELY. DRY OFF WITH WHITE TOWELS SINCE THIS WASH CAN BLEACH COLORS, Disp: , Rfl:    budesonide-formoterol (SYMBICORT) 160-4.5 MCG/ACT inhaler, Inhale 2 puffs into the lungs 2 (two) times daily., Disp: , Rfl:    Carboxymethylcellulose Sodium 1 % GEL, APPLY 1 DROP TO EACH EYE FOUR TIMES A DAY, Disp: , Rfl:    cetirizine (ZYRTEC) 10 MG tablet, Take 1 tablet by mouth daily., Disp: , Rfl:    diclofenac Sodium (VOLTAREN) 1 % GEL, APPLY 4 GRAMS TO AFFECTED AREA FOUR TIMES A DAY AS NEEDED, Disp: , Rfl:    dicyclomine (BENTYL) 10 MG capsule, TAKE ONE CAPSULE BY MOUTH TWICE A DAY BEFORE MEALS, Disp: , Rfl:    fluticasone (FLONASE) 50 MCG/ACT nasal spray, Place 1 spray into both nostrils as needed. 2 sprays each nostril once a day, Disp: , Rfl:    formoterol (PERFOROMIST) 20 MCG/2ML nebulizer solution, Take 20 mcg by nebulization 4 (four) times daily. (Patient not taking: Reported on 03/19/2021), Disp: , Rfl:    HYDROcodone-acetaminophen (NORCO) 10-325 MG tablet, TAKE ONE-HALF  TO ONE TABLET BY MOUTH TWICE A DAY AS NEEDED (9/65M,10/32M), Disp: , Rfl:    inFLIXimab-abda 100 MG SOLR, Inject into the vein every 6 (six) weeks.  (Patient not taking: Reported on 03/19/2021), Disp: , Rfl:    ipratropium-albuterol (DUONEB) 0.5-2.5 (3) MG/3ML SOLN, Inhale 3 mLs into the lungs every 4 (four) hours as needed (for wheezing).  (Patient not taking: Reported on 03/19/2021), Disp: , Rfl:    lisinopril (ZESTRIL) 10 MG tablet, Take 1 tablet by mouth daily., Disp: , Rfl:    loperamide (IMODIUM) 2 MG capsule, TAKE ONE CAPSULE BY MOUTH AS DIRECTED BY YOUR PROVIDER AS NEEDED    AFTER EACH LOOSE STOOL NO MORE THAN 4 TIMES PER DAY, Disp: , Rfl:    naloxone (NARCAN) nasal spray 4 mg/0.1 mL, SPRAY 1 SPRAY INTO ONE NOSTRIL AS DIRECTED FOR OPIOID OVERDOSE (TURN PERSON ON SIDE AFTER DOSE. IF NO RESPONSE IN 2-3 MINUTES OR PERSON RESPONDS BUT RELAPSES, REPEAT USING A NEW SPRAY DEVICE AND SPRAY INTO THE OTHER NOSTRIL. CALL 911 AFTER USE.) * EMERGENCY USE ONLY *, Disp: , Rfl:    omeprazole (PRILOSEC) 40 MG capsule, TAKE ONE CAPSULE BY MOUTH DAILY (TAKE ON AN EMPTY STOMACH 30 MINUTES PRIOR TO A MEAL), Disp: , Rfl:    predniSONE (DELTASONE) 10 MG tablet, TAKE TWO TABLETS BY MOUTH IN THE MORNING -CONTINUE THE 20 MG DAILY UNTIL FURTHER INSTRUCTED (TAKE WITH FOOD), Disp: , Rfl:  sildenafil (VIAGRA) 50 MG tablet, Take 1 tablet (50 mg total) by mouth as needed for erectile dysfunction. (Patient not taking: No sig reported), Disp: 10 tablet, Rfl: 2   simvastatin (ZOCOR) 80 MG tablet, Take 80 mg by mouth at bedtime. (Patient not taking: Reported on 03/19/2021), Disp: , Rfl:    tamsulosin (FLOMAX) 0.4 MG CAPS, Take 0.4 mg by mouth at bedtime., Disp: , Rfl:   Past Medical History: Past Medical History:  Diagnosis Date   Asthma    Colitis    COPD (chronic obstructive pulmonary disease) (HCC)    Crohn's disease (HCC)     Tobacco Use: Social History   Tobacco Use  Smoking Status Former   Types: Cigars    Quit date: 09/07/1986   Years since quitting: 34.5  Smokeless Tobacco Never    Labs: Recent Review Flowsheet Data     Labs for ITP Cardiac and Pulmonary Rehab Latest Ref Rng & Units 04/22/2007 04/07/2009 04/08/2009 10/09/2015   Cholestrol 0 - 200 mg/dL 253(GU) - 440 .Marland Kitchen.(H) 282(H)   LDLCALC 0 - 99 mg/dL - - 347 .Marland Kitchen.(H) 189(H)   LDLDIRECT mg/dL 425.9 - - -   HDL >56.38 mg/dL 37.3(L) - 40 56.90   Trlycerides 0.0 - 149.0 mg/dL 756 - 433 295.0(H)   TCO2 0 - 100 mmol/L - 30 - -        Pulmonary Assessment Scores:  Pulmonary Assessment Scores     Row Name 03/24/21 1424         ADL UCSD   ADL Phase Entry     SOB Score total 90     Rest 2     Walk 4     Stairs 5     Bath 3     Dress 4     Shop 3       CAT Score   CAT Score 31       mMRC Score   mMRC Score 1              UCSD: Self-administered rating of dyspnea associated with activities of daily living (ADLs) 6-point scale (0 = "not at all" to 5 = "maximal or unable to do because of breathlessness")  Scoring Scores range from 0 to 120.  Minimally important difference is 5 units  CAT: CAT can identify the health impairment of COPD patients and is better correlated with disease progression.  CAT has a scoring range of zero to 40. The CAT score is classified into four groups of low (less than 10), medium (10 - 20), high (21-30) and very high (31-40) based on the impact level of disease on health status. A CAT score over 10 suggests significant symptoms.  A worsening CAT score could be explained by an exacerbation, poor medication adherence, poor inhaler technique, or progression of COPD or comorbid conditions.  CAT MCID is 2 points  mMRC: mMRC (Modified Medical Research Council) Dyspnea Scale is used to assess the degree of baseline functional disability in patients of respiratory disease due to dyspnea. No minimal important difference is established. A decrease in score of 1 point or greater is considered a positive  change.   Pulmonary Function Assessment:   Exercise Target Goals: Exercise Program Goal: Individual exercise prescription set using results from initial 6 min walk test and THRR while considering  patient's activity barriers and safety.   Exercise Prescription Goal: Initial exercise prescription builds to 30-45 minutes a day of aerobic activity, 2-3 days per week.  Home  exercise guidelines will be given to patient during program as part of exercise prescription that the participant will acknowledge.  Education: Aerobic Exercise: - Group verbal and visual presentation on the components of exercise prescription. Introduces F.I.T.T principle from ACSM for exercise prescriptions.  Reviews F.I.T.T. principles of aerobic exercise including progression. Written material given at graduation. Flowsheet Row Pulmonary Rehab from 04/02/2021 in [facility] Cardiac and Pulmonary Rehab  Education need identified 03/24/21  Date 03/26/21  Educator [facility]  Instruction Review Code 1- Verbalizes Understanding       Education: Resistance Exercise: - Group verbal and visual presentation on the components of exercise prescription. Introduces F.I.T.T principle from ACSM for exercise prescriptions  Reviews F.I.T.T. principles of resistance exercise including progression. Written material given at graduation. Flowsheet Row Pulmonary Rehab from 04/02/2021 in [facility] Cardiac and Pulmonary Rehab  Date 04/02/21  Educator [facility]  Instruction Review Code 1- Verbalizes Understanding        Education: Exercise & Equipment Safety: - Individual verbal instruction and demonstration of equipment use and safety with use of the equipment. Flowsheet Row Pulmonary Rehab from 04/02/2021 in [facility] Cardiac and Pulmonary Rehab  Date 03/24/21  Educator [facility]  Instruction Review Code 1- Verbalizes Understanding       Education: Exercise Physiology & General Exercise Guidelines: - Group verbal and written instruction with models to review the  exercise physiology of the cardiovascular system and associated critical values. Provides general exercise guidelines with specific guidelines to those with heart or lung disease.    Education: Flexibility, Balance, Mind/Body Relaxation: - Group verbal and visual presentation with interactive activity on the components of exercise prescription. Introduces F.I.T.T principle from ACSM for exercise prescriptions. Reviews F.I.T.T. principles of flexibility and balance exercise training including progression. Also discusses the mind body connection.  Reviews various relaxation techniques to help reduce and manage stress (i.e. Deep breathing, progressive muscle relaxation, and visualization). Balance handout provided to take home. Written material given at graduation.   Activity Barriers & Risk Stratification:  Activity Barriers & Cardiac Risk Stratification - 03/24/21 1420       Activity Barriers & Cardiac Risk Stratification   Activity Barriers Shortness of Breath;Back Problems;Deconditioning             6 Minute Walk:  6 Minute Walk     Row Name 03/24/21 1416         6 Minute Walk   Phase Initial     Distance 1282 feet     Walk Time 6 minutes     # of Rest Breaks 0     MPH 2.43     METS 2.84     RPE 16     Perceived Dyspnea  3     VO2 Peak 9.96     Symptoms Yes (comment)     Comments SOB     Resting HR 92 bpm     Resting BP 134/66     Resting Oxygen Saturation  92 %     Exercise Oxygen Saturation  during 6 min walk 84 %     Max Ex. HR 105 bpm     Max Ex. BP 138/74     2 Minute Post BP 138/70       Interval HR   1 Minute HR 104     2 Minute HR 103     3 Minute HR 97     4 Minute HR 105     5 Minute HR 89     6  Minute HR 90     2 Minute Post HR 98     Interval Heart Rate? Yes       Interval Oxygen   Interval Oxygen? Yes     Baseline Oxygen Saturation % 92 %     1 Minute Oxygen Saturation % 85 %     1 Minute Liters of Oxygen 0 L  Room Air     2 Minute Oxygen  Saturation % 84 %     2 Minute Liters of Oxygen 0 L     3 Minute Oxygen Saturation % 85 %     3 Minute Liters of Oxygen 0 L     4 Minute Oxygen Saturation % 84 %     4 Minute Liters of Oxygen 0 L     5 Minute Oxygen Saturation % 85 %     5 Minute Liters of Oxygen 0 L     6 Minute Oxygen Saturation % 87 %     6 Minute Liters of Oxygen 0 L     2 Minute Post Oxygen Saturation % 94 %     2 Minute Post Liters of Oxygen 0 L             Oxygen Initial Assessment:  Oxygen Initial Assessment - 03/19/21 1035       Home Oxygen   Home Oxygen Device None    Sleep Oxygen Prescription CPAP    Home Exercise Oxygen Prescription None    Home Resting Oxygen Prescription None      Initial 6 min Walk   Oxygen Used None      Program Oxygen Prescription   Program Oxygen Prescription None      Intervention   Short Term Goals To learn and understand importance of maintaining oxygen saturations>88%;To learn and demonstrate proper pursed lip breathing techniques or other breathing techniques. ;To learn and demonstrate proper use of respiratory medications;To learn and understand importance of monitoring SPO2 with pulse oximeter and demonstrate accurate use of the pulse oximeter.    Long  Term Goals Verbalizes importance of monitoring SPO2 with pulse oximeter and return demonstration;Maintenance of O2 saturations>88%;Exhibits proper breathing techniques, such as pursed lip breathing or other method taught during program session;Compliance with respiratory medication             Oxygen Re-Evaluation:  Oxygen Re-Evaluation     Row Name 03/26/21 0724             Program Oxygen Prescription   Program Oxygen Prescription None         Home Oxygen   Home Oxygen Device None       Sleep Oxygen Prescription CPAP       Home Exercise Oxygen Prescription None       Home Resting Oxygen Prescription None         Goals/Expected Outcomes   Short Term Goals To learn and understand importance of  maintaining oxygen saturations>88%;To learn and demonstrate proper pursed lip breathing techniques or other breathing techniques. ;To learn and understand importance of monitoring SPO2 with pulse oximeter and demonstrate accurate use of the pulse oximeter.       Long  Term Goals Verbalizes importance of monitoring SPO2 with pulse oximeter and return demonstration;Maintenance of O2 saturations>88%;Exhibits proper breathing techniques, such as pursed lip breathing or other method taught during program session;Compliance with respiratory medication       Comments Reviewed PLB technique with pt.  Talked about how it works and it's  importance in maintaining their exercise saturations.       Goals/Expected Outcomes Short: Become more profiecient at using PLB.   Long: Become independent at using PLB.                Oxygen Discharge (Final Oxygen Re-Evaluation):  Oxygen Re-Evaluation - 03/26/21 0724       Program Oxygen Prescription   Program Oxygen Prescription None      Home Oxygen   Home Oxygen Device None    Sleep Oxygen Prescription CPAP    Home Exercise Oxygen Prescription None    Home Resting Oxygen Prescription None      Goals/Expected Outcomes   Short Term Goals To learn and understand importance of maintaining oxygen saturations>88%;To learn and demonstrate proper pursed lip breathing techniques or other breathing techniques. ;To learn and understand importance of monitoring SPO2 with pulse oximeter and demonstrate accurate use of the pulse oximeter.    Long  Term Goals Verbalizes importance of monitoring SPO2 with pulse oximeter and return demonstration;Maintenance of O2 saturations>88%;Exhibits proper breathing techniques, such as pursed lip breathing or other method taught during program session;Compliance with respiratory medication    Comments Reviewed PLB technique with pt.  Talked about how it works and it's importance in maintaining their exercise saturations.    Goals/Expected  Outcomes Short: Become more profiecient at using PLB.   Long: Become independent at using PLB.             Initial Exercise Prescription:  Initial Exercise Prescription - 03/24/21 1400       Date of Initial Exercise RX and Referring Provider   Date 03/24/21    Referring Provider Marion, [place] MD      Oxygen   Maintain Oxygen Saturation 88% or higher      Treadmill   MPH 2.2    Grade 0.5    Minutes 15    METs 2.84      Recumbant Bike   Level 2    RPM 50    Watts 26    Minutes 15    METs 2.8      REL-XR   Level 2    Speed 50    Minutes 15    METs 2.8      T5 Nustep   Level 2    SPM 80    Minutes 15    METs 2.8      Prescription Details   Frequency (times per week) 3    Duration Progress to 30 minutes of continuous aerobic without signs/symptoms of physical distress      Intensity   THRR 40-80% of Max Heartrate 116-140    Ratings of Perceived Exertion 11-13    Perceived Dyspnea 0-4      Progression   Progression Continue to progress workloads to maintain intensity without signs/symptoms of physical distress.      Resistance Training   Training Prescription Yes    Weight 3 lb    Reps 10-15             Perform Capillary Blood Glucose checks as needed.  Exercise Prescription Changes:   Exercise Prescription Changes     Row Name 04/02/21 0700             Home Exercise Plan   Plans to continue exercise at Home (comment)  walking,treadmill,weights       Frequency Add 2 additional days to program exercise sessions.       Initial Home Exercises Provided 04/02/21  Exercise Comments:   Exercise Comments     Row Name 03/26/21 [phone]           Exercise Comments First full day of exercise!  Patient was oriented to gym and equipment including functions, settings, policies, and procedures.  Patient's individual exercise prescription and treatment plan were reviewed.  All starting workloads were established based on the  results of the 6 minute walk test done at initial orientation visit.  The plan for exercise progression was also introduced and progression will be customized based on patient's performance and goals.                Exercise Goals and Review:   Exercise Goals     Row Name 03/24/21 1422             Exercise Goals   Increase Physical Activity Yes       Intervention Provide advice, education, support and counseling about physical activity/exercise needs.;Develop an individualized exercise prescription for aerobic and resistive training based on initial evaluation findings, risk stratification, comorbidities and participant's personal goals.       Expected Outcomes Short Term: Attend rehab on a regular basis to increase amount of physical activity.;Long Term: Add in home exercise to make exercise part of routine and to increase amount of physical activity.;Long Term: Exercising regularly at least 3-5 days a week.       Increase Strength and Stamina Yes       Intervention Provide advice, education, support and counseling about physical activity/exercise needs.;Develop an individualized exercise prescription for aerobic and resistive training based on initial evaluation findings, risk stratification, comorbidities and participant's personal goals.       Expected Outcomes Short Term: Increase workloads from initial exercise prescription for resistance, speed, and METs.;Short Term: Perform resistance training exercises routinely during rehab and add in resistance training at home;Long Term: Improve cardiorespiratory fitness, muscular endurance and strength as measured by increased METs and functional capacity (6MWT)       Able to understand and use rate of perceived exertion (RPE) scale Yes       Intervention Provide education and explanation on how to use RPE scale       Expected Outcomes Short Term: Able to use RPE daily in rehab to express subjective intensity level;Long Term:  Able to use RPE  to guide intensity level when exercising independently       Able to understand and use Dyspnea scale Yes       Intervention Provide education and explanation on how to use Dyspnea scale       Expected Outcomes Short Term: Able to use Dyspnea scale daily in rehab to express subjective sense of shortness of breath during exertion;Long Term: Able to use Dyspnea scale to guide intensity level when exercising independently       Knowledge and understanding of Target Heart Rate Range (THRR) Yes       Intervention Provide education and explanation of THRR including how the numbers were predicted and where they are located for reference       Expected Outcomes Short Term: Able to state/look up THRR;Short Term: Able to use daily as guideline for intensity in rehab;Long Term: Able to use THRR to govern intensity when exercising independently       Able to check pulse independently Yes       Intervention Provide education and demonstration on how to check pulse in carotid and radial arteries.;Review the importance of being able to check your  own pulse for safety during independent exercise       Expected Outcomes Short Term: Able to explain why pulse checking is important during independent exercise;Long Term: Able to check pulse independently and accurately       Understanding of Exercise Prescription Yes       Intervention Provide education, explanation, and written materials on patient's individual exercise prescription       Expected Outcomes Short Term: Able to explain program exercise prescription;Long Term: Able to explain home exercise prescription to exercise independently                Exercise Goals Re-Evaluation :  Exercise Goals Re-Evaluation     Row Name 03/26/21 0723 04/02/21 0744           Exercise Goal Re-Evaluation   Exercise Goals Review Increase Physical Activity;Able to understand and use rate of perceived exertion (RPE) scale;Knowledge and understanding of Target Heart Rate  Range (THRR);Understanding of Exercise Prescription;Increase Strength and Stamina;Able to understand and use Dyspnea scale;Able to check pulse independently Increase Physical Activity;Able to understand and use rate of perceived exertion (RPE) scale;Knowledge and understanding of Target Heart Rate Range (THRR);Understanding of Exercise Prescription;Increase Strength and Stamina;Able to understand and use Dyspnea scale;Able to check pulse independently      Comments Reviewed RPE and dyspnea scales, THR and program prescription with pt today.  Pt voiced understanding and was given a copy of goals to take home. Reviewed home exercise with pt today.  Pt plans to walk and use treadmill at home for exercise.  He also has a total gym and pedal machine that he can use.  Reviewed THR, pulse, RPE, sign and symptoms, pulse oximetery and when to call 911 or MD.  Also discussed weather considerations and indoor options.  Pt voiced understanding.      Expected Outcomes Short: Use RPE daily to regulate intensity. Long: Follow program prescription in THR. Short: Start to add in at least one extra day walking at home Long: Continue to exercise independently               Discharge Exercise Prescription (Final Exercise Prescription Changes):  Exercise Prescription Changes - 04/02/21 0700       Home Exercise Plan   Plans to continue exercise at Home (comment)   walking,treadmill,weights   Frequency Add 2 additional days to program exercise sessions.    Initial Home Exercises Provided 04/02/21             Nutrition:  Target Goals: Understanding of nutrition guidelines, daily intake of sodium 1500mg , cholesterol 200mg , calories 30% from fat and 7% or less from saturated fats, daily to have 5 or more servings of fruits and vegetables.  Education: All About Nutrition: -Group instruction provided by verbal, written material, interactive activities, discussions, models, and posters to present general  guidelines for heart healthy nutrition including fat, fiber, MyPlate, the role of sodium in heart healthy nutrition, utilization of the nutrition label, and utilization of this knowledge for meal planning. Follow up email sent as well. Written material given at graduation.   Biometrics:  Pre Biometrics - 03/24/21 1422       Pre Biometrics   Height 5' 9.5" (1.765 m)    Weight 219 lb 3.2 oz (99.4 kg)    BMI (Calculated) 31.92    Single Leg Stand 30 seconds              Nutrition Therapy Plan and Nutrition Goals:  Nutrition Therapy & Goals -  03/24/21 1423       Intervention Plan   Intervention Prescribe, educate and counsel regarding individualized specific dietary modifications aiming towards targeted core components such as weight, hypertension, lipid management, diabetes, heart failure and other comorbidities.    Expected Outcomes Short Term Goal: Understand basic principles of dietary content, such as calories, fat, sodium, cholesterol and nutrients.;Long Term Goal: Adherence to prescribed nutrition plan.;Short Term Goal: A plan has been developed with personal nutrition goals set during dietitian appointment.             Nutrition Assessments:  MEDIFICTS Score Key: ?70 Need to make dietary changes  40-70 Heart Healthy Diet ? 40 Therapeutic Level Cholesterol Diet  Flowsheet Row Pulmonary Rehab from 03/24/2021 in [facility] Cardiac and Pulmonary Rehab  Picture Your Plate Total Score on Admission 42      Picture Your Plate Scores: [id number] Unhealthy dietary pattern with much room for improvement. 41-50 Dietary pattern unlikely to meet recommendations for good health and room for improvement. 51-60 More healthful dietary pattern, with some room for improvement.  >60 Healthy dietary pattern, although there may be some specific behaviors that could be improved.   Nutrition Goals Re-Evaluation:   Nutrition Goals Discharge (Final Nutrition Goals  Re-Evaluation):   Psychosocial: Target Goals: Acknowledge presence or absence of significant depression and/or stress, maximize coping skills, provide positive support system. Participant is able to verbalize types and ability to use techniques and skills needed for reducing stress and depression.   Education: Stress, Anxiety, and Depression - Group verbal and visual presentation to define topics covered.  Reviews how body is impacted by stress, anxiety, and depression.  Also discusses healthy ways to reduce stress and to treat/manage anxiety and depression.  Written material given at graduation.   Education: Sleep Hygiene -Provides group verbal and written instruction about how sleep can affect your health.  Define sleep hygiene, discuss sleep cycles and impact of sleep habits. Review good sleep hygiene tips.    Initial Review & Psychosocial Screening:  Initial Psych Review & Screening - 03/19/21 1041       Initial Review   Current issues with Current Sleep Concerns      Family Dynamics   Good Support System? Yes   church family, friends     Barriers   Psychosocial barriers to participate in program There are no identifiable barriers or psychosocial needs.;The patient should benefit from training in stress management and relaxation.      Screening Interventions   Interventions Encouraged to exercise;Provide feedback about the scores to participant;To provide support and resources with identified psychosocial needs    Expected Outcomes Short Term goal: Utilizing psychosocial counselor, staff and physician to assist with identification of specific Stressors or current issues interfering with healing process. Setting desired goal for each stressor or current issue identified.;Long Term Goal: Stressors or current issues are controlled or eliminated.;Short Term goal: Identification and review with participant of any Quality of Life or Depression concerns found by scoring the  questionnaire.;Long Term goal: The participant improves quality of Life and PHQ9 Scores as seen by post scores and/or verbalization of changes             Quality of Life Scores:  Scores of 19 and below usually indicate a poorer quality of life in these areas.  A difference of  2-3 points is a clinically meaningful difference.  A difference of 2-3 points in the total score of the Quality of Life Index has been associated with significant improvement  in overall quality of life, self-image, physical symptoms, and general health in studies assessing change in quality of life.  PHQ-9: Recent Review Flowsheet Data     Depression screen [facility] 2/9 03/24/2021 02/14/2018 12/21/2017 10/08/2017 10/09/2015   Decreased Interest 3 0 0 0 0   Down, Depressed, Hopeless 2 0 0 0 0   PHQ - 2 Score 5 0 0 0 0   Altered sleeping 3 1 - - -   Tired, decreased energy 3 1 - - -   Change in appetite 1 0 - - -   Feeling bad or failure about yourself  0 0 - - -   Trouble concentrating 1 0 - - -   Moving slowly or fidgety/restless 0 0 - - -   Suicidal thoughts 0 0 - - -   PHQ-9 Score 13 2 - - -   Difficult doing work/chores Very difficult Not difficult at all - - -      Interpretation of Total Score  Total Score Depression Severity:  1-4 = Minimal depression, 5-9 = Mild depression, 10-14 = Moderate depression, 15-19 = Moderately severe depression, 20-27 = Severe depression   Psychosocial Evaluation and Intervention:  Psychosocial Evaluation - 03/19/21 1042       Psychosocial Evaluation & Interventions   Interventions Encouraged to exercise with the program and follow exercise prescription    Comments Kevin Yang is coming to Pulmonary Rehab after worsening dyspnea. He has done the program before at [facility] He is very motivated to work hard and tries not to let his health deter him. He relies on his church family and friends. He does report issues sleeping where he only gets about 4 hours of sleep before his back  starts to bother him. He usually will get up and nap later during the day. Recently his breathing has gotten worse and his diverticulitis has come back. He is on medication to help with both and is hoping he can repair the hit his stamina has taken.    Expected Outcomes Short: attend pulmonary rehab for education and exercise. Long: develop and maintain positive self care habtis.    Continue Psychosocial Services  Follow up required by staff             Psychosocial Re-Evaluation:   Psychosocial Discharge (Final Psychosocial Re-Evaluation):   Education: Education Goals: Education classes will be provided on a weekly basis, covering required topics. Participant will state understanding/return demonstration of topics presented.  Learning Barriers/Preferences:  Learning Barriers/Preferences - 03/19/21 1041       Learning Barriers/Preferences   Learning Barriers None    Learning Preferences Skilled Demonstration;Written Material             General Pulmonary Education Topics:  Infection Prevention: - Provides verbal and written material to individual with discussion of infection control including proper hand washing and proper equipment cleaning during exercise session. Flowsheet Row Pulmonary Rehab from 04/02/2021 in [facility] Cardiac and Pulmonary Rehab  Date 03/24/21  Educator [facility]  Instruction Review Code 1- Verbalizes Understanding       Falls Prevention: - Provides verbal and written material to individual with discussion of falls prevention and safety. Flowsheet Row Pulmonary Rehab from 04/02/2021 in [facility] Cardiac and Pulmonary Rehab  Date 03/24/21  Educator [facility]  Instruction Review Code 1- Verbalizes Understanding       Chronic Lung Disease Review: - Group verbal instruction with posters, models, PowerPoint presentations and videos,  to review new updates, new respiratory medications, new  advancements in procedures and treatments. Providing information on websites and  "800" numbers for continued self-education. Includes information about supplement oxygen, available portable oxygen systems, continuous and intermittent flow rates, oxygen safety, concentrators, and Medicare reimbursement for oxygen. Explanation of Pulmonary Drugs, including class, frequency, complications, importance of spacers, rinsing mouth after steroid MDI's, and proper cleaning methods for nebulizers. Review of basic lung anatomy and physiology related to function, structure, and complications of lung disease. Review of risk factors. Discussion about methods for diagnosing sleep apnea and types of masks and machines for OSA. Includes a review of the use of types of environmental controls: home humidity, furnaces, filters, dust mite/pet prevention, HEPA vacuums. Discussion about weather changes, air quality and the benefits of nasal washing. Instruction on Warning signs, infection symptoms, calling MD promptly, preventive modes, and value of vaccinations. Review of effective airway clearance, coughing and/or vibration techniques. Emphasizing that all should Create an Action Plan. Written material given at graduation. Flowsheet Row Pulmonary Rehab from 04/02/2021 in [facility] Cardiac and Pulmonary Rehab  Education need identified 03/24/21       AED/CPR: - Group verbal and written instruction with the use of models to demonstrate the basic use of the AED with the basic ABC's of resuscitation.    Anatomy and Cardiac Procedures: - Group verbal and visual presentation and models provide information about basic cardiac anatomy and function. Reviews the testing methods done to diagnose heart disease and the outcomes of the test results. Describes the treatment choices: Medical Management, Angioplasty, or Coronary Bypass Surgery for treating various heart conditions including Myocardial Infarction, Angina, Valve Disease, and Cardiac Arrhythmias.  Written material given at graduation. Flowsheet Row Pulmonary  Rehab from 04/02/2021 in [facility] Cardiac and Pulmonary Rehab  Date 04/02/21  Educator [facility]  Instruction Review Code 1- Verbalizes Understanding       Medication Safety: - Group verbal and visual instruction to review commonly prescribed medications for heart and lung disease. Reviews the medication, class of the drug, and side effects. Includes the steps to properly store meds and maintain the prescription regimen.  Written material given at graduation.   Other: -Provides group and verbal instruction on various topics (see comments) Flowsheet Row PULMONARY REHAB CHRONIC OBSTRUCTIVE PULMONARY DISEASE from 12/09/2017 in [place]  Date 11/25/17  Educator [place]  Instruction Review Code 1- Verbalizes Understanding       Knowledge Questionnaire Score:  Knowledge Questionnaire Score - 03/24/21 1423       Knowledge Questionnaire Score   Pre Score 15/18              Core Components/Risk Factors/Patient Goals at Admission:  Personal Goals and Risk Factors at Admission - 03/24/21 1423       Core Components/Risk Factors/Patient Goals on Admission    Weight Management Yes;Weight Loss;Obesity    Intervention Weight Management: Develop a combined nutrition and exercise program designed to reach desired caloric intake, while maintaining appropriate intake of nutrient and fiber, sodium and fats, and appropriate energy expenditure required for the weight goal.;Weight Management: Provide education and appropriate resources to help participant work on and attain dietary goals.;Weight Management/Obesity: Establish reasonable short term and long term weight goals.;Obesity: Provide education and appropriate resources to help participant work on and attain dietary goals.    Admit Weight 219 lb 3.2 oz (99.4 kg)    Goal Weight: Short Term 214 lb (97.1 kg)    Goal Weight: Long Term 209 lb (94.8  kg)    Expected Outcomes Short  Term: Continue to assess and modify interventions until short term weight is achieved;Long Term: Adherence to nutrition and physical activity/exercise program aimed toward attainment of established weight goal;Weight Maintenance: Understanding of the daily nutrition guidelines, which includes 25-35% calories from fat, 7% or less cal from saturated fats, less than 200mg  cholesterol, less than 1.5gm of sodium, & 5 or more servings of fruits and vegetables daily;Understanding recommendations for meals to include 15-35% energy as protein, 25-35% energy from fat, 35-60% energy from carbohydrates, less than 200mg  of dietary cholesterol, 20-35 gm of total fiber daily;Understanding of distribution of calorie intake throughout the day with the consumption of 4-5 meals/snacks    Improve shortness of breath with ADL's Yes    Intervention Provide education, individualized exercise plan and daily activity instruction to help decrease symptoms of SOB with activities of daily living.    Expected Outcomes Short Term: Improve cardiorespiratory fitness to achieve a reduction of symptoms when performing ADLs;Long Term: Be able to perform more ADLs without symptoms or delay the onset of symptoms    Increase knowledge of respiratory medications and ability to use respiratory devices properly  Yes    Intervention Provide education and demonstration as needed of appropriate use of medications, inhalers, and oxygen therapy.    Expected Outcomes Short Term: Achieves understanding of medications use. Understands that oxygen is a medication prescribed by physician. Demonstrates appropriate use of inhaler and oxygen therapy.;Long Term: Maintain appropriate use of medications, inhalers, and oxygen therapy.             Education:Diabetes - Individual verbal and written instruction to review signs/symptoms of diabetes, desired ranges of glucose level fasting, after meals and with exercise. Acknowledge that pre and post exercise  glucose checks will be done for 3 sessions at entry of program.   Know Your Numbers and Heart Failure: - Group verbal and visual instruction to discuss disease risk factors for cardiac and pulmonary disease and treatment options.  Reviews associated critical values for Overweight/Obesity, Hypertension, Cholesterol, and Diabetes.  Discusses basics of heart failure: signs/symptoms and treatments.  Introduces Heart Failure Zone chart for action plan for heart failure.  Written material given at graduation.   Core Components/Risk Factors/Patient Goals Review:    Core Components/Risk Factors/Patient Goals at Discharge (Final Review):    ITP Comments:  ITP Comments     Row Name 03/19/21 1051 03/24/21 1416 03/26/21 0722 04/02/21 0811     ITP Comments Initial telephone orientation completed. Diagnosis can be found in CE 8/15. EP orientation scheduled for Monday 10/31 at 8am. Completed 6MWT and gym orientation. Initial ITP created and sent for review to Dr. Zetta Bills, Medical Director. First full day of exercise!  Patient was oriented to gym and equipment including functions, settings, policies, and procedures.  Patient's individual exercise prescription and treatment plan were reviewed.  All starting workloads were established based on the results of the 6 minute walk test done at initial orientation visit.  The plan for exercise progression was also introduced and progression will be customized based on patient's performance and goals. 30 Day review completed. Medical Director ITP review done, changes made as directed, and signed approval by Medical Director.   New to program             Comments:  30 Day review completed. Medical Director ITP review done, changes made as directed, and signed approval by Medical Director.

## 2021-04-03 ENCOUNTER — Encounter: Payer: No Typology Code available for payment source | Admitting: *Deleted

## 2021-04-03 DIAGNOSIS — R0609 Other forms of dyspnea: Secondary | ICD-10-CM

## 2021-04-03 NOTE — Progress Notes (Signed)
Daily Session Note  Patient Details  Name: STANCIL DEISHER MRN: [id number] Date of Birth: 08-30-1950 Referring Provider:   Flowsheet Row Pulmonary Rehab from 03/24/2021 in [facility] Cardiac and Pulmonary Rehab  Referring Provider [place], [place] MD       Encounter Date: 04/03/2021  Check In:  Session Check In - 04/03/21 0828       Check-In   Supervising physician immediately available to respond to emergencies See telemetry face sheet for immediately available ER MD    Location [facility]-Cardiac & Pulmonary Rehab    Staff Present Nyoka Cowden, RN, BSN, Tyna Jaksch, MS, ASCM CEP, Exercise Physiologist;Amanda Oletta Darter, BA, ACSM CEP, Exercise Physiologist    Virtual Visit No    Medication changes reported     No    Tobacco Cessation No Change    Warm-up and Cool-down Performed on first and last piece of equipment    Resistance Training Performed Yes    VAD Patient? No    PAD/SET Patient? No      Pain Assessment   Currently in Pain? No/denies                Social History   Tobacco Use  Smoking Status Former   Types: Cigars   Quit date: 09/07/1986   Years since quitting: 34.5  Smokeless Tobacco Never    Goals Met:  Independence with exercise equipment Exercise tolerated well No report of concerns or symptoms today  Goals Unmet:  Not Applicable  Comments: Pt able to follow exercise prescription today without complaint.  Will continue to monitor for progression.    Dr. Emily Filbert is Medical Director for [place].  Dr. Ottie Glazier is Medical Director for [facility] Pulmonary Rehabilitation.

## 2021-04-07 ENCOUNTER — Other Ambulatory Visit: Payer: Self-pay

## 2021-04-07 ENCOUNTER — Encounter: Payer: No Typology Code available for payment source | Admitting: *Deleted

## 2021-04-07 DIAGNOSIS — R0609 Other forms of dyspnea: Secondary | ICD-10-CM | POA: Diagnosis not present

## 2021-04-07 NOTE — Progress Notes (Signed)
Daily Session Note  Patient Details  Name: Kevin Yang MRN: [id number] Date of Birth: 1951/04/13 Referring Provider:   Flowsheet Row Pulmonary Rehab from 03/24/2021 in [facility] Cardiac and Pulmonary Rehab  Referring Provider [place], [place] MD       Encounter Date: 04/07/2021  Check In:  Session Check In - 04/07/21 0805       Check-In   Supervising physician immediately available to respond to emergencies See telemetry face sheet for immediately available ER MD    Location [facility]-Cardiac & Pulmonary Rehab    Staff Present Heath Lark, RN, BSN, Laveda Norman, BS, ACSM CEP, Exercise Physiologist;Joseph [place], [place]    Virtual Visit No    Medication changes reported     No    Fall or balance concerns reported    No    Warm-up and Cool-down Performed on first and last piece of equipment    Resistance Training Performed Yes    VAD Patient? No    PAD/SET Patient? No      Pain Assessment   Currently in Pain? No/denies                Social History   Tobacco Use  Smoking Status Former   Types: Cigars   Quit date: 09/07/1986   Years since quitting: 34.6  Smokeless Tobacco Never    Goals Met:  Proper associated with RPD/PD & O2 Sat Independence with exercise equipment Exercise tolerated well No report of concerns or symptoms today  Goals Unmet:  Not Applicable  Comments: Pt able to follow exercise prescription today without complaint.  Will continue to monitor for progression.    Dr. Emily Filbert is Medical Director for [place].  Dr. Ottie Glazier is Medical Director for [facility] Pulmonary Rehabilitation.

## 2021-04-08 ENCOUNTER — Other Ambulatory Visit: Payer: Self-pay

## 2021-04-08 DIAGNOSIS — R0609 Other forms of dyspnea: Secondary | ICD-10-CM | POA: Diagnosis not present

## 2021-04-08 NOTE — Progress Notes (Signed)
Daily Session Note  Patient Details  Name: Kevin Yang MRN: [id number] Date of Birth: 1951-05-29 Referring Provider:   Flowsheet Row Pulmonary Rehab from 03/24/2021 in [facility] Cardiac and Pulmonary Rehab  Referring Provider [place], [place] MD       Encounter Date: 04/08/2021  Check In:  Session Check In - 04/08/21 0719       Check-In   Supervising physician immediately available to respond to emergencies See telemetry face sheet for immediately available ER MD    Location [facility]-Cardiac & Pulmonary Rehab    Staff Present Birdie Sons, MPA, RN;Amanda Oletta Darter, BA, ACSM CEP, Exercise Physiologist;Melissa Caiola, RDN, LDN    Virtual Visit No    Medication changes reported     No    Fall or balance concerns reported    No    Tobacco Cessation No Change    Warm-up and Cool-down Performed on first and last piece of equipment    Resistance Training Performed Yes    VAD Patient? No    PAD/SET Patient? No      Pain Assessment   Currently in Pain? No/denies                Social History   Tobacco Use  Smoking Status Former   Types: Cigars   Quit date: 09/07/1986   Years since quitting: 34.6  Smokeless Tobacco Never    Goals Met:  Independence with exercise equipment Exercise tolerated well No report of concerns or symptoms today Strength training completed today  Goals Unmet:  Not Applicable  Comments: Pt able to follow exercise prescription today without complaint.  Will continue to monitor for progression.    Dr. Emily Filbert is Medical Director for [place].  Dr. Ottie Glazier is Medical Director for [facility] Pulmonary Rehabilitation.

## 2021-04-09 DIAGNOSIS — R0609 Other forms of dyspnea: Secondary | ICD-10-CM | POA: Diagnosis not present

## 2021-04-09 NOTE — Progress Notes (Signed)
Daily Session Note  Patient Details  Name: Kevin Yang MRN: [id number] Date of Birth: 02-08-1951 Referring Provider:   Flowsheet Row Pulmonary Rehab from 03/24/2021 in [facility] Cardiac and Pulmonary Rehab  Referring Provider [place], [place] MD       Encounter Date: 04/09/2021  Check In:  Session Check In - 04/09/21 0713       Check-In   Supervising physician immediately available to respond to emergencies See telemetry face sheet for immediately available ER MD    Location [facility]-Cardiac & Pulmonary Rehab    Staff Present Birdie Sons, MPA, RN;Joseph Talpa, [place], MA, RCEP, CCRP, CCET    Virtual Visit No    Medication changes reported     No    Fall or balance concerns reported    No    Tobacco Cessation No Change    Warm-up and Cool-down Performed on first and last piece of equipment    Resistance Training Performed Yes    VAD Patient? No    PAD/SET Patient? No      Pain Assessment   Currently in Pain? No/denies                Social History   Tobacco Use  Smoking Status Former   Types: Cigars   Quit date: 09/07/1986   Years since quitting: 34.6  Smokeless Tobacco Never    Goals Met:  Independence with exercise equipment Exercise tolerated well No report of concerns or symptoms today Strength training completed today  Goals Unmet:  Not Applicable  Comments: Pt able to follow exercise prescription today without complaint.  Will continue to monitor for progression.    Dr. Emily Filbert is Medical Director for [place].  Dr. Ottie Glazier is Medical Director for [facility] Pulmonary Rehabilitation.

## 2021-04-09 NOTE — Progress Notes (Signed)
Completed initial RD consultation ?

## 2021-04-14 ENCOUNTER — Encounter: Payer: No Typology Code available for payment source | Admitting: *Deleted

## 2021-04-14 ENCOUNTER — Other Ambulatory Visit: Payer: Self-pay

## 2021-04-14 DIAGNOSIS — R0609 Other forms of dyspnea: Secondary | ICD-10-CM | POA: Diagnosis not present

## 2021-04-14 NOTE — Progress Notes (Signed)
Daily Session Note  Patient Details  Name: Kevin Yang MRN: [id number] Date of Birth: Dec 01, 1950 Referring Provider:   Flowsheet Row Pulmonary Rehab from 03/24/2021 in [facility] Cardiac and Pulmonary Rehab  Referring Provider [place], [place] MD       Encounter Date: 04/14/2021  Check In:  Session Check In - 04/14/21 0804       Check-In   Location [facility]-Cardiac & Pulmonary Rehab    Staff Present Heath Lark, RN, BSN, CCRP;Joseph [place], RCP,RRT,BSRT;Kelly Cadott, [place], ACSM CEP, Exercise Physiologist    Virtual Visit No    Medication changes reported     No    Fall or balance concerns reported    No    Warm-up and Cool-down Performed on first and last piece of equipment    Resistance Training Performed Yes    VAD Patient? No    PAD/SET Patient? No      Pain Assessment   Currently in Pain? No/denies                Social History   Tobacco Use  Smoking Status Former   Types: Cigars   Quit date: 09/07/1986   Years since quitting: 34.6  Smokeless Tobacco Never    Goals Met:  Proper associated with RPD/PD & O2 Sat Independence with exercise equipment Exercise tolerated well No report of concerns or symptoms today  Goals Unmet:  Not Applicable  Comments: Pt able to follow exercise prescription today without complaint.  Will continue to monitor for progression.    Dr. Emily Filbert is Medical Director for [place].  Dr. Ottie Glazier is Medical Director for [facility] Pulmonary Rehabilitation.

## 2021-04-15 ENCOUNTER — Other Ambulatory Visit: Payer: Self-pay

## 2021-04-15 DIAGNOSIS — R0609 Other forms of dyspnea: Secondary | ICD-10-CM

## 2021-04-15 NOTE — Progress Notes (Signed)
Daily Session Note  Patient Details  Name: Kevin Yang MRN: [id number] Date of Birth: Feb 13, 1951 Referring Provider:   Flowsheet Row Pulmonary Rehab from 03/24/2021 in [facility] Cardiac and Pulmonary Rehab  Referring Provider [place], [place] MD       Encounter Date: 04/15/2021  Check In:  Session Check In - 04/15/21 0710       Check-In   Supervising physician immediately available to respond to emergencies See telemetry face sheet for immediately available ER MD    Location [facility]-Cardiac & Pulmonary Rehab    Staff Present Birdie Sons, MPA, RN;Amanda Oletta Darter, BA, ACSM CEP, Exercise Physiologist;Jessica Luan [place], MA, RCEP, CCRP, CCET    Virtual Visit No    Medication changes reported     No    Fall or balance concerns reported    No    Tobacco Cessation No Change    Warm-up and Cool-down Performed on first and last piece of equipment    Resistance Training Performed Yes    VAD Patient? No    PAD/SET Patient? No      Pain Assessment   Currently in Pain? No/denies                Social History   Tobacco Use  Smoking Status Former   Types: Cigars   Quit date: 09/07/1986   Years since quitting: 34.6  Smokeless Tobacco Never    Goals Met:  Independence with exercise equipment Exercise tolerated well No report of concerns or symptoms today Strength training completed today  Goals Unmet:  Not Applicable  Comments: Pt able to follow exercise prescription today without complaint.  Will continue to monitor for progression.    Dr. Emily Filbert is Medical Director for [place].  Dr. Ottie Glazier is Medical Director for [facility] Pulmonary Rehabilitation.

## 2021-04-17 ENCOUNTER — Other Ambulatory Visit: Payer: Self-pay

## 2021-04-17 DIAGNOSIS — R0609 Other forms of dyspnea: Secondary | ICD-10-CM | POA: Diagnosis not present

## 2021-04-17 NOTE — Progress Notes (Signed)
Daily Session Note  Patient Details  Name: Kevin Yang MRN: [id number] Date of Birth: 1950/12/26 Referring Provider:   Flowsheet Row Pulmonary Rehab from 03/24/2021 in [facility] Cardiac and Pulmonary Rehab  Referring Provider [place], [place] MD       Encounter Date: 04/17/2021  Check In:  Session Check In - 04/17/21 0725       Check-In   Supervising physician immediately available to respond to emergencies See telemetry face sheet for immediately available ER MD    Location [facility]-Cardiac & Pulmonary Rehab    Staff Present Birdie Sons, MPA, RN;Amanda Oletta Darter, BA, ACSM CEP, Exercise Physiologist;Oleg Oleson Amedeo Plenty, BS, ACSM CEP, Exercise Physiologist;Kara Eliezer Bottom, MS, ASCM CEP, Exercise Physiologist    Virtual Visit No    Medication changes reported     No    Fall or balance concerns reported    No    Tobacco Cessation No Change    Warm-up and Cool-down Performed on first and last piece of equipment    Resistance Training Performed Yes    VAD Patient? No    PAD/SET Patient? No      Pain Assessment   Currently in Pain? No/denies                Social History   Tobacco Use  Smoking Status Former   Types: Cigars   Quit date: 09/07/1986   Years since quitting: 34.6  Smokeless Tobacco Never    Goals Met:  Independence with exercise equipment Exercise tolerated well No report of concerns or symptoms today Strength training completed today  Goals Unmet:  Not Applicable  Comments: Pt able to follow exercise prescription today without complaint.  Will continue to monitor for progression.    Dr. Emily Filbert is Medical Director for [place].  Dr. Ottie Glazier is Medical Director for [facility] Pulmonary Rehabilitation.

## 2021-04-21 ENCOUNTER — Other Ambulatory Visit: Payer: Self-pay

## 2021-04-21 ENCOUNTER — Encounter: Payer: No Typology Code available for payment source | Admitting: *Deleted

## 2021-04-21 DIAGNOSIS — R0609 Other forms of dyspnea: Secondary | ICD-10-CM

## 2021-04-21 NOTE — Progress Notes (Signed)
Daily Session Note  Patient Details  Name: Kevin Yang MRN: [id number] Date of Birth: 05/10/1951 Referring Provider:   Flowsheet Row Pulmonary Rehab from 03/24/2021 in [facility] Cardiac and Pulmonary Rehab  Referring Provider [place], [place] MD       Encounter Date: 04/21/2021  Check In:  Session Check In - 04/21/21 0805       Check-In   Supervising physician immediately available to respond to emergencies See telemetry face sheet for immediately available ER MD    Location [facility]-Cardiac & Pulmonary Rehab    Staff Present Heath Lark, RN, BSN, Laveda Norman, BS, ACSM CEP, Exercise Physiologist;Joseph [place], [place]    Virtual Visit No    Medication changes reported     No    Fall or balance concerns reported    No    Warm-up and Cool-down Performed on first and last piece of equipment    Resistance Training Performed No    VAD Patient? No    PAD/SET Patient? No      Pain Assessment   Currently in Pain? No/denies                Social History   Tobacco Use  Smoking Status Former   Types: Cigars   Quit date: 09/07/1986   Years since quitting: 34.6  Smokeless Tobacco Never    Goals Met:  Proper associated with RPD/PD & O2 Sat Independence with exercise equipment Exercise tolerated well No report of concerns or symptoms today  Goals Unmet:  Not Applicable  Comments: Pt able to follow exercise prescription today without complaint.  Will continue to monitor for progression.    Dr. Emily Filbert is Medical Director for [place].  Dr. Ottie Glazier is Medical Director for [facility] Pulmonary Rehabilitation.

## 2021-04-22 DIAGNOSIS — R0609 Other forms of dyspnea: Secondary | ICD-10-CM

## 2021-04-22 NOTE — Progress Notes (Signed)
Daily Session Note  Patient Details  Name: Kevin Yang MRN: [id number] Date of Birth: 10/16/1950 Referring Provider:   Flowsheet Row Pulmonary Rehab from 03/24/2021 in [facility] Cardiac and Pulmonary Rehab  Referring Provider [place], [place] MD       Encounter Date: 04/22/2021  Check In:  Session Check In - 04/22/21 0709       Check-In   Supervising physician immediately available to respond to emergencies See telemetry face sheet for immediately available ER MD    Location [facility]-Cardiac & Pulmonary Rehab    Staff Present Birdie Sons, MPA, RN;Melissa Eastshore, RDN, Rowe Pavy, BA, ACSM CEP, Exercise Physiologist    Virtual Visit No    Medication changes reported     No    Fall or balance concerns reported    No    Tobacco Cessation No Change    Warm-up and Cool-down Performed on first and last piece of equipment    Resistance Training Performed Yes    VAD Patient? No    PAD/SET Patient? No      Pain Assessment   Currently in Pain? No/denies                Social History   Tobacco Use  Smoking Status Former   Types: Cigars   Quit date: 09/07/1986   Years since quitting: 34.6  Smokeless Tobacco Never    Goals Met:  Independence with exercise equipment Exercise tolerated well No report of concerns or symptoms today Strength training completed today  Goals Unmet:  Not Applicable  Comments: Pt able to follow exercise prescription today without complaint.  Will continue to monitor for progression.    Dr. Emily Filbert is Medical Director for [place].  Dr. Ottie Glazier is Medical Director for [facility] Pulmonary Rehabilitation.

## 2021-04-23 ENCOUNTER — Other Ambulatory Visit: Payer: Self-pay

## 2021-04-23 DIAGNOSIS — R0609 Other forms of dyspnea: Secondary | ICD-10-CM | POA: Diagnosis not present

## 2021-04-23 NOTE — Progress Notes (Signed)
Daily Session Note  Patient Details  Name: Kevin Yang MRN: [id number] Date of Birth: 1951/05/13 Referring Provider:   Flowsheet Row Pulmonary Rehab from 03/24/2021 in [facility] Cardiac and Pulmonary Rehab  Referring Provider [place], [place] MD       Encounter Date: 04/23/2021  Check In:  Session Check In - 04/23/21 0715       Check-In   Supervising physician immediately available to respond to emergencies See telemetry face sheet for immediately available ER MD    Location [facility]-Cardiac & Pulmonary Rehab    Staff Present Birdie Sons, MPA, Elveria Rising, BA, ACSM CEP, Exercise Physiologist;Joseph Tessie Fass, [place]    Virtual Visit No    Medication changes reported     No    Fall or balance concerns reported    No    Tobacco Cessation No Change    Warm-up and Cool-down Performed on first and last piece of equipment    Resistance Training Performed Yes    VAD Patient? No    PAD/SET Patient? No      Pain Assessment   Currently in Pain? No/denies                Social History   Tobacco Use  Smoking Status Former   Types: Cigars   Quit date: 09/07/1986   Years since quitting: 34.6  Smokeless Tobacco Never    Goals Met:  Independence with exercise equipment Exercise tolerated well No report of concerns or symptoms today Strength training completed today  Goals Unmet:  Not Applicable  Comments: Pt able to follow exercise prescription today without complaint.  Will continue to monitor for progression.    Dr. Emily Filbert is Medical Director for [place].  Dr. Ottie Glazier is Medical Director for [facility] Pulmonary Rehabilitation.

## 2021-04-28 ENCOUNTER — Other Ambulatory Visit: Payer: Self-pay

## 2021-04-28 ENCOUNTER — Encounter: Payer: No Typology Code available for payment source | Admitting: *Deleted

## 2021-04-28 DIAGNOSIS — R0609 Other forms of dyspnea: Secondary | ICD-10-CM | POA: Diagnosis not present

## 2021-04-28 NOTE — Progress Notes (Signed)
Daily Session Note  Patient Details  Name: Kevin Yang MRN: [id number] Date of Birth: 03-23-1951 Referring Provider:   Flowsheet Row Pulmonary Rehab from 03/24/2021 in [facility] Cardiac and Pulmonary Rehab  Referring Provider [place], [place] MD       Encounter Date: 04/28/2021  Check In:  Session Check In - 04/28/21 0810       Check-In   Supervising physician immediately available to respond to emergencies See telemetry face sheet for immediately available ER MD    Location [facility]-Cardiac & Pulmonary Rehab    Staff Present Heath Lark, RN, BSN, Laveda Norman, BS, ACSM CEP, Exercise Physiologist;Joseph [place], [place]    Virtual Visit No    Medication changes reported     No    Fall or balance concerns reported    No    Warm-up and Cool-down Performed on first and last piece of equipment    Resistance Training Performed Yes    VAD Patient? No    PAD/SET Patient? No      Pain Assessment   Currently in Pain? No/denies                Social History   Tobacco Use  Smoking Status Former   Types: Cigars   Quit date: 09/07/1986   Years since quitting: 34.6  Smokeless Tobacco Never    Goals Met:  Proper associated with RPD/PD & O2 Sat Independence with exercise equipment Exercise tolerated well No report of concerns or symptoms today  Goals Unmet:  Not Applicable  Comments: Pt able to follow exercise prescription today without complaint.  Will continue to monitor for progression.    Dr. Emily Filbert is Medical Director for [place].  Dr. Ottie Glazier is Medical Director for [facility] Pulmonary Rehabilitation.

## 2021-04-30 ENCOUNTER — Encounter: Payer: Self-pay | Admitting: *Deleted

## 2021-04-30 ENCOUNTER — Other Ambulatory Visit: Payer: Self-pay

## 2021-04-30 DIAGNOSIS — R0609 Other forms of dyspnea: Secondary | ICD-10-CM

## 2021-04-30 NOTE — Progress Notes (Signed)
Pulmonary Individual Treatment Plan  Patient Details  Name: JAMIEL GONCALVES MRN: [id number] Date of Birth: 04-Jun-1950 Referring Provider:   Flowsheet Row Pulmonary Rehab from 03/24/2021 in [facility] Cardiac and Pulmonary Rehab  Referring Provider Marion, [place] MD       Initial Encounter Date:  Flowsheet Row Pulmonary Rehab from 03/24/2021 in [facility] Cardiac and Pulmonary Rehab  Date 03/24/21       Visit Diagnosis: Dyspnea on exertion  Patient's Home Medications on Admission:  Current Outpatient Medications:    albuterol (PROVENTIL HFA;VENTOLIN HFA) 108 (90 BASE) MCG/ACT inhaler, Inhale 2 puffs into the lungs every 6 (six) hours as needed for wheezing or shortness of breath. , Disp: , Rfl:    atorvastatin (LIPITOR) 80 MG tablet, TAKE ONE TABLET BY MOUTH AT BEDTIME FOR CHOLESTEROL, Disp: , Rfl:    Benzoyl Peroxide 10 % LIQD, WASH AS DIRECTED AFFECTED AREA DAILY : APPLY TO THE SCALP, CHEST AND BACK DAILY IN THE SHOWER. RINSE OFF COMPLETELY. DRY OFF WITH WHITE TOWELS SINCE THIS WASH CAN BLEACH COLORS, Disp: , Rfl:    budesonide-formoterol (SYMBICORT) 160-4.5 MCG/ACT inhaler, Inhale 2 puffs into the lungs 2 (two) times daily., Disp: , Rfl:    Carboxymethylcellulose Sodium 1 % GEL, APPLY 1 DROP TO EACH EYE FOUR TIMES A DAY, Disp: , Rfl:    cetirizine (ZYRTEC) 10 MG tablet, Take 1 tablet by mouth daily., Disp: , Rfl:    diclofenac Sodium (VOLTAREN) 1 % GEL, APPLY 4 GRAMS TO AFFECTED AREA FOUR TIMES A DAY AS NEEDED, Disp: , Rfl:    dicyclomine (BENTYL) 10 MG capsule, TAKE ONE CAPSULE BY MOUTH TWICE A DAY BEFORE MEALS, Disp: , Rfl:    fluticasone (FLONASE) 50 MCG/ACT nasal spray, Place 1 spray into both nostrils as needed. 2 sprays each nostril once a day, Disp: , Rfl:    formoterol (PERFOROMIST) 20 MCG/2ML nebulizer solution, Take 20 mcg by nebulization 4 (four) times daily. (Patient not taking: Reported on 03/19/2021), Disp: , Rfl:    HYDROcodone-acetaminophen (NORCO) 10-325 MG tablet, TAKE ONE-HALF  TO ONE TABLET BY MOUTH TWICE A DAY AS NEEDED (9/65M,10/32M), Disp: , Rfl:    inFLIXimab-abda 100 MG SOLR, Inject into the vein every 6 (six) weeks.  (Patient not taking: Reported on 03/19/2021), Disp: , Rfl:    ipratropium-albuterol (DUONEB) 0.5-2.5 (3) MG/3ML SOLN, Inhale 3 mLs into the lungs every 4 (four) hours as needed (for wheezing).  (Patient not taking: Reported on 03/19/2021), Disp: , Rfl:    lisinopril (ZESTRIL) 10 MG tablet, Take 1 tablet by mouth daily., Disp: , Rfl:    loperamide (IMODIUM) 2 MG capsule, TAKE ONE CAPSULE BY MOUTH AS DIRECTED BY YOUR PROVIDER AS NEEDED    AFTER EACH LOOSE STOOL NO MORE THAN 4 TIMES PER DAY, Disp: , Rfl:    naloxone (NARCAN) nasal spray 4 mg/0.1 mL, SPRAY 1 SPRAY INTO ONE NOSTRIL AS DIRECTED FOR OPIOID OVERDOSE (TURN PERSON ON SIDE AFTER DOSE. IF NO RESPONSE IN 2-3 MINUTES OR PERSON RESPONDS BUT RELAPSES, REPEAT USING A NEW SPRAY DEVICE AND SPRAY INTO THE OTHER NOSTRIL. CALL 911 AFTER USE.) * EMERGENCY USE ONLY *, Disp: , Rfl:    omeprazole (PRILOSEC) 40 MG capsule, TAKE ONE CAPSULE BY MOUTH DAILY (TAKE ON AN EMPTY STOMACH 30 MINUTES PRIOR TO A MEAL), Disp: , Rfl:    predniSONE (DELTASONE) 10 MG tablet, TAKE TWO TABLETS BY MOUTH IN THE MORNING -CONTINUE THE 20 MG DAILY UNTIL FURTHER INSTRUCTED (TAKE WITH FOOD), Disp: , Rfl:  sildenafil (VIAGRA) 50 MG tablet, Take 1 tablet (50 mg total) by mouth as needed for erectile dysfunction. (Patient not taking: No sig reported), Disp: 10 tablet, Rfl: 2   simvastatin (ZOCOR) 80 MG tablet, Take 80 mg by mouth at bedtime. (Patient not taking: Reported on 03/19/2021), Disp: , Rfl:    tamsulosin (FLOMAX) 0.4 MG CAPS, Take 0.4 mg by mouth at bedtime., Disp: , Rfl:   Past Medical History: Past Medical History:  Diagnosis Date   Asthma    Colitis    COPD (chronic obstructive pulmonary disease) (HCC)    Crohn's disease ([place])     Tobacco Use: Social History   Tobacco Use  Smoking Status Former   Types: Cigars    Quit date: 09/07/1986   Years since quitting: 34.6  Smokeless Tobacco Never    Labs: Recent Review Flowsheet Data     Labs for ITP Cardiac and Pulmonary Rehab Latest Ref Rng & Units 04/22/2007 04/07/2009 04/08/2009 10/09/2015   Cholestrol 0 - 200 mg/dL 212(HH) - 270 .Marland Kitchen.(H) 282(H)   LDLCALC 0 - 99 mg/dL - - 206 .Marland Kitchen.(H) 189(H)   LDLDIRECT mg/dL 146.5 - - -   HDL >39.00 mg/dL 37.3(L) - 40 56.90   Trlycerides 0.0 - 149.0 mg/dL 113 - 122 180.0(H)   TCO2 0 - 100 mmol/L - 30 - -        Pulmonary Assessment Scores:  Pulmonary Assessment Scores     Row Name 03/24/21 1424         ADL UCSD   ADL Phase Entry     SOB Score total 90     Rest 2     Walk 4     Stairs 5     Bath 3     Dress 4     Shop 3       CAT Score   CAT Score 31       mMRC Score   mMRC Score 1              UCSD: Self-administered rating of dyspnea associated with activities of daily living (ADLs) 6-point scale (0 = "not at all" to 5 = "maximal or unable to do because of breathlessness")  Scoring Scores range from 0 to 120.  Minimally important difference is 5 units  CAT: CAT can identify the health impairment of COPD patients and is better correlated with disease progression.  CAT has a scoring range of zero to 40. The CAT score is classified into four groups of low (less than 10), medium (10 - 20), high (21-30) and very high (31-40) based on the impact level of disease on health status. A CAT score over 10 suggests significant symptoms.  A worsening CAT score could be explained by an exacerbation, poor medication adherence, poor inhaler technique, or progression of COPD or comorbid conditions.  CAT MCID is 2 points  mMRC: mMRC (Modified Medical Research Council) Dyspnea Scale is used to assess the degree of baseline functional disability in patients of respiratory disease due to dyspnea. No minimal important difference is established. A decrease in score of 1 point or greater is considered a positive  change.   Pulmonary Function Assessment:   Exercise Target Goals: Exercise Program Goal: Individual exercise prescription set using results from initial 6 min walk test and THRR while considering  patient's activity barriers and safety.   Exercise Prescription Goal: Initial exercise prescription builds to 30-45 minutes a day of aerobic activity, 2-3 days per week.  Home  exercise guidelines will be given to patient during program as part of exercise prescription that the participant will acknowledge.  Education: Aerobic Exercise: - Group verbal and visual presentation on the components of exercise prescription. Introduces F.I.T.T principle from ACSM for exercise prescriptions.  Reviews F.I.T.T. principles of aerobic exercise including progression. Written material given at graduation. Flowsheet Row Pulmonary Rehab from 04/30/2021 in [facility] Cardiac and Pulmonary Rehab  Education need identified 03/24/21  Date 03/26/21  Educator [facility]  Instruction Review Code 1- Verbalizes Understanding       Education: Resistance Exercise: - Group verbal and visual presentation on the components of exercise prescription. Introduces F.I.T.T principle from ACSM for exercise prescriptions  Reviews F.I.T.T. principles of resistance exercise including progression. Written material given at graduation. Flowsheet Row Pulmonary Rehab from 04/30/2021 in [facility] Cardiac and Pulmonary Rehab  Date 04/02/21  Educator [facility]  Instruction Review Code 1- Verbalizes Understanding        Education: Exercise & Equipment Safety: - Individual verbal instruction and demonstration of equipment use and safety with use of the equipment. Flowsheet Row Pulmonary Rehab from 04/30/2021 in [facility] Cardiac and Pulmonary Rehab  Date 03/24/21  Educator [facility]  Instruction Review Code 1- Verbalizes Understanding       Education: Exercise Physiology & General Exercise Guidelines: - Group verbal and written instruction with models to review the  exercise physiology of the cardiovascular system and associated critical values. Provides general exercise guidelines with specific guidelines to those with heart or lung disease.    Education: Flexibility, Balance, Mind/Body Relaxation: - Group verbal and visual presentation with interactive activity on the components of exercise prescription. Introduces F.I.T.T principle from ACSM for exercise prescriptions. Reviews F.I.T.T. principles of flexibility and balance exercise training including progression. Also discusses the mind body connection.  Reviews various relaxation techniques to help reduce and manage stress (i.e. Deep breathing, progressive muscle relaxation, and visualization). Balance handout provided to take home. Written material given at graduation. Flowsheet Row Pulmonary Rehab from 04/30/2021 in [facility] Cardiac and Pulmonary Rehab  Date 04/09/21  Educator [facility]  Instruction Review Code 1- Verbalizes Understanding       Activity Barriers & Risk Stratification:  Activity Barriers & Cardiac Risk Stratification - 03/24/21 1420       Activity Barriers & Cardiac Risk Stratification   Activity Barriers Shortness of Breath;Back Problems;Deconditioning             6 Minute Walk:  6 Minute Walk     Row Name 03/24/21 1416         6 Minute Walk   Phase Initial     Distance 1282 feet     Walk Time 6 minutes     # of Rest Breaks 0     MPH 2.43     METS 2.84     RPE 16     Perceived Dyspnea  3     VO2 Peak 9.96     Symptoms Yes (comment)     Comments SOB     Resting HR 92 bpm     Resting BP 134/66     Resting Oxygen Saturation  92 %     Exercise Oxygen Saturation  during 6 min walk 84 %     Max Ex. HR 105 bpm     Max Ex. BP 138/74     2 Minute Post BP 138/70       Interval HR   1 Minute HR 104     2 Minute HR 103  3 Minute HR 97     4 Minute HR 105     5 Minute HR 89     6 Minute HR 90     2 Minute Post HR 98     Interval Heart Rate? Yes       Interval  Oxygen   Interval Oxygen? Yes     Baseline Oxygen Saturation % 92 %     1 Minute Oxygen Saturation % 85 %     1 Minute Liters of Oxygen 0 L  Room Air     2 Minute Oxygen Saturation % 84 %     2 Minute Liters of Oxygen 0 L     3 Minute Oxygen Saturation % 85 %     3 Minute Liters of Oxygen 0 L     4 Minute Oxygen Saturation % 84 %     4 Minute Liters of Oxygen 0 L     5 Minute Oxygen Saturation % 85 %     5 Minute Liters of Oxygen 0 L     6 Minute Oxygen Saturation % 87 %     6 Minute Liters of Oxygen 0 L     2 Minute Post Oxygen Saturation % 94 %     2 Minute Post Liters of Oxygen 0 L             Oxygen Initial Assessment:  Oxygen Initial Assessment - 03/19/21 1035       Home Oxygen   Home Oxygen Device None    Sleep Oxygen Prescription CPAP    Home Exercise Oxygen Prescription None    Home Resting Oxygen Prescription None      Initial 6 min Walk   Oxygen Used None      Program Oxygen Prescription   Program Oxygen Prescription None      Intervention   Short Term Goals To learn and understand importance of maintaining oxygen saturations>88%;To learn and demonstrate proper pursed lip breathing techniques or other breathing techniques. ;To learn and demonstrate proper use of respiratory medications;To learn and understand importance of monitoring SPO2 with pulse oximeter and demonstrate accurate use of the pulse oximeter.    Long  Term Goals Verbalizes importance of monitoring SPO2 with pulse oximeter and return demonstration;Maintenance of O2 saturations>88%;Exhibits proper breathing techniques, such as pursed lip breathing or other method taught during program session;Compliance with respiratory medication             Oxygen Re-Evaluation:  Oxygen Re-Evaluation     Row Name 03/26/21 0724 04/07/21 0727           Program Oxygen Prescription   Program Oxygen Prescription None None        Home Oxygen   Home Oxygen Device None None      Sleep Oxygen  Prescription CPAP CPAP      Home Exercise Oxygen Prescription None None      Home Resting Oxygen Prescription None None      Compliance with Home Oxygen Use -- Yes        Goals/Expected Outcomes   Short Term Goals To learn and understand importance of maintaining oxygen saturations>88%;To learn and demonstrate proper pursed lip breathing techniques or other breathing techniques. ;To learn and understand importance of monitoring SPO2 with pulse oximeter and demonstrate accurate use of the pulse oximeter. To learn and understand importance of maintaining oxygen saturations>88%;To learn and understand importance of monitoring SPO2 with pulse oximeter and demonstrate accurate use of the  pulse oximeter.      Long  Term Goals Verbalizes importance of monitoring SPO2 with pulse oximeter and return demonstration;Maintenance of O2 saturations>88%;Exhibits proper breathing techniques, such as pursed lip breathing or other method taught during program session;Compliance with respiratory medication Maintenance of O2 saturations>88%;Verbalizes importance of monitoring SPO2 with pulse oximeter and return demonstration      Comments Reviewed PLB technique with pt.  Talked about how it works and it's importance in maintaining their exercise saturations. He has a pulse oximeter to check his oxygen saturation at home. Informed and explained why it is important to have one. Reviewed that oxygen saturations should be 88 percent and above. Patient is going to check his oxygen when at rest and on exertion. PLB reviewed with patient. He uses it often when he gets out of breath and to calm himself down.      Goals/Expected Outcomes Short: Become more profiecient at using PLB.   Long: Become independent at using PLB. Short: monitor oxygen at home with exertion. Long: maintain oxygen saturations above 88 percent independently.               Oxygen Discharge (Final Oxygen Re-Evaluation):  Oxygen Re-Evaluation - 04/07/21 0727        Program Oxygen Prescription   Program Oxygen Prescription None      Home Oxygen   Home Oxygen Device None    Sleep Oxygen Prescription CPAP    Home Exercise Oxygen Prescription None    Home Resting Oxygen Prescription None    Compliance with Home Oxygen Use Yes      Goals/Expected Outcomes   Short Term Goals To learn and understand importance of maintaining oxygen saturations>88%;To learn and understand importance of monitoring SPO2 with pulse oximeter and demonstrate accurate use of the pulse oximeter.    Long  Term Goals Maintenance of O2 saturations>88%;Verbalizes importance of monitoring SPO2 with pulse oximeter and return demonstration    Comments He has a pulse oximeter to check his oxygen saturation at home. Informed and explained why it is important to have one. Reviewed that oxygen saturations should be 88 percent and above. Patient is going to check his oxygen when at rest and on exertion. PLB reviewed with patient. He uses it often when he gets out of breath and to calm himself down.    Goals/Expected Outcomes Short: monitor oxygen at home with exertion. Long: maintain oxygen saturations above 88 percent independently.             Initial Exercise Prescription:  Initial Exercise Prescription - 03/24/21 1400       Date of Initial Exercise RX and Referring Provider   Date 03/24/21    Referring Provider Marion, [place] MD      Oxygen   Maintain Oxygen Saturation 88% or higher      Treadmill   MPH 2.2    Grade 0.5    Minutes 15    METs 2.84      Recumbant Bike   Level 2    RPM 50    Watts 26    Minutes 15    METs 2.8      REL-XR   Level 2    Speed 50    Minutes 15    METs 2.8      T5 Nustep   Level 2    SPM 80    Minutes 15    METs 2.8      Prescription Details   Frequency (times per week) 3  Duration Progress to 30 minutes of continuous aerobic without signs/symptoms of physical distress      Intensity   THRR 40-80% of Max Heartrate  116-140    Ratings of Perceived Exertion 11-13    Perceived Dyspnea 0-4      Progression   Progression Continue to progress workloads to maintain intensity without signs/symptoms of physical distress.      Resistance Training   Training Prescription Yes    Weight 3 lb    Reps 10-15             Perform Capillary Blood Glucose checks as needed.  Exercise Prescription Changes:   Exercise Prescription Changes     Row Name 04/02/21 0700 04/07/21 1400 04/21/21 1500         Response to Exercise   Blood Pressure (Admit) -- 132/68 152/70     Blood Pressure (Exercise) -- 166/70 138/68     Blood Pressure (Exit) -- 130/72 146/70     Heart Rate (Admit) -- 93 bpm 84 bpm     Heart Rate (Exercise) -- 107 bpm 98 bpm     Heart Rate (Exit) -- 96 bpm 91 bpm     Oxygen Saturation (Admit) -- 92 % 94 %     Oxygen Saturation (Exercise) -- 88 % 93 %     Oxygen Saturation (Exit) -- 90 % 95 %     Rating of Perceived Exertion (Exercise) -- 13 12     Perceived Dyspnea (Exercise) -- 2 2     Symptoms -- SOB SOB     Duration -- Progress to 30 minutes of  aerobic without signs/symptoms of physical distress Continue with 30 min of aerobic exercise without signs/symptoms of physical distress.     Intensity -- THRR unchanged THRR unchanged       Progression   Progression -- Continue to progress workloads to maintain intensity without signs/symptoms of physical distress. Continue to progress workloads to maintain intensity without signs/symptoms of physical distress.     Average METs -- 2.6 3.29       Resistance Training   Training Prescription -- Yes Yes     Weight -- 4 lb 6 lb     Reps -- 10-15 10-15       Interval Training   Interval Training -- No No       Treadmill   MPH -- 2.2 2.5     Grade -- 0.5 1     Minutes -- 15 15     METs -- 2.84 3.26       REL-XR   Level -- 2 4     Minutes -- 15 15     METs -- -- 3.6       T5 Nustep   Level -- -- 4     Minutes -- -- 15     METs -- --  2.6       Home Exercise Plan   Plans to continue exercise at Home (comment)  walking,treadmill,weights Home (comment)  walking,treadmill,weights Home (comment)  walking,treadmill,weights     Frequency Add 2 additional days to program exercise sessions. Add 2 additional days to program exercise sessions. Add 2 additional days to program exercise sessions.     Initial Home Exercises Provided 04/02/21 04/02/21 04/02/21              Exercise Comments:   Exercise Comments     Row Name 03/26/21 [phone]  Exercise Comments First full day of exercise!  Patient was oriented to gym and equipment including functions, settings, policies, and procedures.  Patient's individual exercise prescription and treatment plan were reviewed.  All starting workloads were established based on the results of the 6 minute walk test done at initial orientation visit.  The plan for exercise progression was also introduced and progression will be customized based on patient's performance and goals.                Exercise Goals and Review:   Exercise Goals     Row Name 03/24/21 1422             Exercise Goals   Increase Physical Activity Yes       Intervention Provide advice, education, support and counseling about physical activity/exercise needs.;Develop an individualized exercise prescription for aerobic and resistive training based on initial evaluation findings, risk stratification, comorbidities and participant's personal goals.       Expected Outcomes Short Term: Attend rehab on a regular basis to increase amount of physical activity.;Long Term: Add in home exercise to make exercise part of routine and to increase amount of physical activity.;Long Term: Exercising regularly at least 3-5 days a week.       Increase Strength and Stamina Yes       Intervention Provide advice, education, support and counseling about physical activity/exercise needs.;Develop an individualized exercise  prescription for aerobic and resistive training based on initial evaluation findings, risk stratification, comorbidities and participant's personal goals.       Expected Outcomes Short Term: Increase workloads from initial exercise prescription for resistance, speed, and METs.;Short Term: Perform resistance training exercises routinely during rehab and add in resistance training at home;Long Term: Improve cardiorespiratory fitness, muscular endurance and strength as measured by increased METs and functional capacity (6MWT)       Able to understand and use rate of perceived exertion (RPE) scale Yes       Intervention Provide education and explanation on how to use RPE scale       Expected Outcomes Short Term: Able to use RPE daily in rehab to express subjective intensity level;Long Term:  Able to use RPE to guide intensity level when exercising independently       Able to understand and use Dyspnea scale Yes       Intervention Provide education and explanation on how to use Dyspnea scale       Expected Outcomes Short Term: Able to use Dyspnea scale daily in rehab to express subjective sense of shortness of breath during exertion;Long Term: Able to use Dyspnea scale to guide intensity level when exercising independently       Knowledge and understanding of Target Heart Rate Range (THRR) Yes       Intervention Provide education and explanation of THRR including how the numbers were predicted and where they are located for reference       Expected Outcomes Short Term: Able to state/look up THRR;Short Term: Able to use daily as guideline for intensity in rehab;Long Term: Able to use THRR to govern intensity when exercising independently       Able to check pulse independently Yes       Intervention Provide education and demonstration on how to check pulse in carotid and radial arteries.;Review the importance of being able to check your own pulse for safety during independent exercise       Expected Outcomes  Short Term: Able to explain why pulse checking is  important during independent exercise;Long Term: Able to check pulse independently and accurately       Understanding of Exercise Prescription Yes       Intervention Provide education, explanation, and written materials on patient's individual exercise prescription       Expected Outcomes Short Term: Able to explain program exercise prescription;Long Term: Able to explain home exercise prescription to exercise independently                Exercise Goals Re-Evaluation :  Exercise Goals Re-Evaluation     Row Name 03/26/21 0723 04/02/21 0744 04/07/21 1417 04/21/21 1513       Exercise Goal Re-Evaluation   Exercise Goals Review Increase Physical Activity;Able to understand and use rate of perceived exertion (RPE) scale;Knowledge and understanding of Target Heart Rate Range (THRR);Understanding of Exercise Prescription;Increase Strength and Stamina;Able to understand and use Dyspnea scale;Able to check pulse independently Increase Physical Activity;Able to understand and use rate of perceived exertion (RPE) scale;Knowledge and understanding of Target Heart Rate Range (THRR);Understanding of Exercise Prescription;Increase Strength and Stamina;Able to understand and use Dyspnea scale;Able to check pulse independently Increase Physical Activity;Increase Strength and Stamina Increase Physical Activity;Increase Strength and Stamina;Understanding of Exercise Prescription    Comments Reviewed RPE and dyspnea scales, THR and program prescription with pt today.  Pt voiced understanding and was given a copy of goals to take home. Reviewed home exercise with pt today.  Pt plans to walk and use treadmill at home for exercise.  He also has a total gym and pedal machine that he can use.  Reviewed THR, pulse, RPE, sign and symptoms, pulse oximetery and when to call 911 or MD.  Also discussed weather considerations and indoor options.  Pt voiced understanding. Lotus is  tolerating exercise well and has increased to 4 lb for strength work.  He is at level 5 on the [place] is doing well in rehab.  His RPE on the XR has dropped to 10 so he should be ready to move up again.  We will continue to monitor his progress.    Expected Outcomes Short: Use RPE daily to regulate intensity. Long: Follow program prescription in THR. Short: Start to add in at least one extra day walking at home Long: Continue to exercise independently Short: attend consistently Long: continue to build stamina Short: Try increasing workloads Long: Continue to improve stamina.             Discharge Exercise Prescription (Final Exercise Prescription Changes):  Exercise Prescription Changes - 04/21/21 1500       Response to Exercise   Blood Pressure (Admit) 152/70    Blood Pressure (Exercise) 138/68    Blood Pressure (Exit) 146/70    Heart Rate (Admit) 84 bpm    Heart Rate (Exercise) 98 bpm    Heart Rate (Exit) 91 bpm    Oxygen Saturation (Admit) 94 %    Oxygen Saturation (Exercise) 93 %    Oxygen Saturation (Exit) 95 %    Rating of Perceived Exertion (Exercise) 12    Perceived Dyspnea (Exercise) 2    Symptoms SOB    Duration Continue with 30 min of aerobic exercise without signs/symptoms of physical distress.    Intensity THRR unchanged      Progression   Progression Continue to progress workloads to maintain intensity without signs/symptoms of physical distress.    Average METs 3.29      Resistance Training   Training Prescription Yes    Weight 6 lb  Reps 10-15      Interval Training   Interval Training No      Treadmill   MPH 2.5    Grade 1    Minutes 15    METs 3.26      REL-XR   Level 4    Minutes 15    METs 3.6      T5 Nustep   Level 4    Minutes 15    METs 2.6      Home Exercise Plan   Plans to continue exercise at Home (comment)   walking,treadmill,weights   Frequency Add 2 additional days to program exercise sessions.    Initial Home Exercises  Provided 04/02/21             Nutrition:  Target Goals: Understanding of nutrition guidelines, daily intake of sodium <1549m, cholesterol <2039m calories 30% from fat and 7% or less from saturated fats, daily to have 5 or more servings of fruits and vegetables.  Education: All About Nutrition: -Group instruction provided by verbal, written material, interactive activities, discussions, models, and posters to present general guidelines for heart healthy nutrition including fat, fiber, MyPlate, the role of sodium in heart healthy nutrition, utilization of the nutrition label, and utilization of this knowledge for meal planning. Follow up email sent as well. Written material given at graduation. Flow[facility]  Date 04/17/21  [facility] Code 1- Verbalizes Understanding       Biometrics:  Pre Biometrics - 03/24/21 1422       Pre Biometrics   Height 5' 9.5" (1.765 m)    Weight 219 lb 3.2 oz (99.4 kg)    BMI (Calculated) 31.92    Single Leg Stand 30 seconds              Nutrition Therapy Plan and Nutrition Goals:  Nutrition Therapy & Goals - 04/09/21 1014       Nutrition Therapy   Diet Heart healthy, low Na, pulmonary MNT    Drug/Food Interactions Statins/Certain Fruits    Protein (specify units) 80g    Fiber 30 grams    Whole Grain Foods 3 servings    Saturated Fats 12 max. grams    Fruits and Vegetables 8 servings/day    Sodium 1.5 grams      Personal Nutrition Goals   Nutrition Goal ST: limit fried foods/high fat meat to <1-2x/week LT: limit Na < 1.5g/day, limit saturated fat < 12g/day, 1/2 grains consumed as whole grains    Comments Patient is a [age].o. M admitted to rehab for primary dx of dyspnea on exertion. PMH of HCL, GERD, Asthma, COPD, ulcerative colitis. Relevant medications includes lipitor, zocor, prednisone, imodium. PYP: 42. He would like to cut back on greasy  foods and include more baked food, he would like to eat more fish. B: McD[employer] coffee with sausage egg and cheese or buttermilk biscuit, cereal (lactaid and frosted flakes and honey nut cheerios) L: same as dinner D: He likes to grill and use airfyer. He reports eating a lot of baked chicken at restaurants, but will get fried chicken at fast food. Every now and again will go out to get hotdogs. They enjoy broccoli, collards, lima beans, carrots, onions. Will do baked and sweet potatoes especially when going out to eat. Grains: white rice, mac and cheese. He will use mostly olive oil whencooking and some butter. He will salt food after eating, but  not much. He will go out to eat 4x/week for lunch or dinner. Drinks: water, tea, soda (1), OJ. He has stable weight. Sometimes when his UC flaires up he will lose weight from diahhrea - he was steady for 12 years, but in the last 3 years they have been switching around his medicaitons. He will stay away from spicy foods. Discussed heart healthy eating and pulmonary MNT.      Intervention Plan   Intervention Prescribe, educate and counsel regarding individualized specific dietary modifications aiming towards targeted core components such as weight, hypertension, lipid management, diabetes, heart failure and other comorbidities.    Expected Outcomes Short Term Goal: Understand basic principles of dietary content, such as calories, fat, sodium, cholesterol and nutrients.;Long Term Goal: Adherence to prescribed nutrition plan.;Short Term Goal: A plan has been developed with personal nutrition goals set during dietitian appointment.             Nutrition Assessments:  MEDIFICTS Score Key: ?70 Need to make dietary changes  40-70 Heart Healthy Diet ? 40 Therapeutic Level Cholesterol Diet  Flowsheet Row Pulmonary Rehab from 03/24/2021 in [facility] Cardiac and Pulmonary Rehab  Picture Your Plate Total Score on Admission 42      Picture Your Plate Scores: <62  Unhealthy dietary pattern with much room for improvement. 41-50 Dietary pattern unlikely to meet recommendations for good health and room for improvement. 51-60 More healthful dietary pattern, with some room for improvement.  >60 Healthy dietary pattern, although there may be some specific behaviors that could be improved.   Nutrition Goals Re-Evaluation:   Nutrition Goals Discharge (Final Nutrition Goals Re-Evaluation):   Psychosocial: Target Goals: Acknowledge presence or absence of significant depression and/or stress, maximize coping skills, provide positive support system. Participant is able to verbalize types and ability to use techniques and skills needed for reducing stress and depression.   Education: Stress, Anxiety, and Depression - Group verbal and visual presentation to define topics covered.  Reviews how body is impacted by stress, anxiety, and depression.  Also discusses healthy ways to reduce stress and to treat/manage anxiety and depression.  Written material given at graduation.   Education: Sleep Hygiene -Provides group verbal and written instruction about how sleep can affect your health.  Define sleep hygiene, discuss sleep cycles and impact of sleep habits. Review good sleep hygiene tips.    Initial Review & Psychosocial Screening:  Initial Psych Review & Screening - 03/19/21 1041       Initial Review   Current issues with Current Sleep Concerns      Family Dynamics   Good Support System? Yes   church family, friends     Barriers   Psychosocial barriers to participate in program There are no identifiable barriers or psychosocial needs.;The patient should benefit from training in stress management and relaxation.      Screening Interventions   Interventions Encouraged to exercise;Provide feedback about the scores to participant;To provide support and resources with identified psychosocial needs    Expected Outcomes Short Term goal: Utilizing psychosocial  counselor, staff and physician to assist with identification of specific Stressors or current issues interfering with healing process. Setting desired goal for each stressor or current issue identified.;Long Term Goal: Stressors or current issues are controlled or eliminated.;Short Term goal: Identification and review with participant of any Quality of Life or Depression concerns found by scoring the questionnaire.;Long Term goal: The participant improves quality of Life and PHQ9 Scores as seen by post scores and/or verbalization of changes  Quality of Life Scores:  Scores of 19 and below usually indicate a poorer quality of life in these areas.  A difference of  2-3 points is a clinically meaningful difference.  A difference of 2-3 points in the total score of the Quality of Life Index has been associated with significant improvement in overall quality of life, self-image, physical symptoms, and general health in studies assessing change in quality of life.  PHQ-9: Recent Review Flowsheet Data     Depression screen [facility] 2/9 04/07/2021 03/24/2021 02/14/2018 12/21/2017 10/08/2017   Decreased Interest 1 3 0 0 0   Down, Depressed, Hopeless 1 2 0 0 0   PHQ - 2 Score 2 5 0 0 0   Altered sleeping _0 - -   Tired, decreased energy _1 - -   Change in appetite 0 1 0 - -   Feeling bad or failure about yourself  0 0 0 - -   Trouble concentrating 0 1 0 - -   Moving slowly or fidgety/restless 0 0 0 - -   Suicidal thoughts 0 0 0 - -   PHQ-9 Score _2 - -   Difficult doing work/chores Somewhat difficult Very difficult Not difficult at all - -      Interpretation of Total Score  Total Score Depression Severity:  1-4 = Minimal depression, 5-9 = Mild depression, 10-14 = Moderate depression, 15-19 = Moderately severe depression, 20-27 = Severe depression   Psychosocial Evaluation and Intervention:  Psychosocial Evaluation - 03/19/21 1042       Psychosocial Evaluation & Interventions    Interventions Encouraged to exercise with the program and follow exercise prescription    Comments Abad is coming to Pulmonary Rehab after worsening dyspnea. He has done the program [facility] at [facility]. He is very motivated to work hard and tries not to let his health deter him. He relies on his church family and friends. He does report issues sleeping where he only gets about 4 hours of sleep before his back starts to bother him. He usually will get up and nap later during the day. Recently his breathing has gotten worse and his diverticulitis has come back. He is on medication to help with both and is hoping he can repair the hit his stamina has taken.    Expected Outcomes Short: attend pulmonary rehab for education and exercise. Long: develop and maintain positive self care habtis.    Continue Psychosocial Services  Follow up required by staff             Psychosocial Re-Evaluation:  Psychosocial Re-Evaluation     [place] Name 04/07/21 0734             Psychosocial Re-Evaluation   Current issues with Current Stress Concerns       Comments Reviewed patient health questionnaire (PHQ-9) with patient for follow up. Previously, patients score indicated signs/symptoms of depression.  Reviewed to see if patient is improving symptom wise while in program.  Score improved and patient states that it is because he has been able to get out and exercise to improve his breathing.       Expected Outcomes Short: Continue to attend [facility] regularly for regular exercise and social engagement. Long: Continue to improve symptoms and manage a positive mental state.       Continue Psychosocial Services  Follow up required by staff  Psychosocial Discharge (Final Psychosocial Re-Evaluation):  Psychosocial Re-Evaluation - 04/07/21 0734       Psychosocial Re-Evaluation   Current issues with Current Stress Concerns    Comments Reviewed patient health questionnaire (PHQ-9) with  patient for follow up. Previously, patients score indicated signs/symptoms of depression.  Reviewed to see if patient is improving symptom wise while in program.  Score improved and patient states that it is because he has been able to get out and exercise to improve his breathing.    Expected Outcomes Short: Continue to attend [facility] regularly for regular exercise and social engagement. Long: Continue to improve symptoms and manage a positive mental state.    Continue Psychosocial Services  Follow up required by staff             Education: Education Goals: Education classes will be provided on a weekly basis, covering required topics. Participant will state understanding/return demonstration of topics presented.  Learning Barriers/Preferences:  Learning Barriers/Preferences - 03/19/21 1041       Learning Barriers/Preferences   Learning Barriers None    Learning Preferences Skilled Demonstration;Written Material             General Pulmonary Education Topics:  Infection Prevention: - Provides verbal and written material to individual with discussion of infection control including proper hand washing and proper equipment cleaning during exercise session. Flowsheet Row Pulmonary Rehab from 04/30/2021 in [facility] Cardiac and Pulmonary Rehab  Date 03/24/21  Educator [facility]  Instruction Review Code 1- Verbalizes Understanding       Falls Prevention: - Provides verbal and written material to individual with discussion of falls prevention and safety. Flowsheet Row Pulmonary Rehab from 04/30/2021 in [facility] Cardiac and Pulmonary Rehab  Date 03/24/21  Educator [facility]  Instruction Review Code 1- Verbalizes Understanding       Chronic Lung Disease Review: - Group verbal instruction with posters, models, PowerPoint presentations and videos,  to review new updates, new respiratory medications, new advancements in procedures and treatments. Providing information on websites and "800" numbers  for continued self-education. Includes information about supplement oxygen, available portable oxygen systems, continuous and intermittent flow rates, oxygen safety, concentrators, and Medicare reimbursement for oxygen. Explanation of Pulmonary Drugs, including class, frequency, complications, importance of spacers, rinsing mouth after steroid MDI's, and proper cleaning methods for nebulizers. Review of basic lung anatomy and physiology related to function, structure, and complications of lung disease. Review of risk factors. Discussion about methods for diagnosing sleep apnea and types of masks and machines for OSA. Includes a review of the use of types of environmental controls: home humidity, furnaces, filters, dust mite/pet prevention, HEPA vacuums. Discussion about weather changes, air quality and the benefits of nasal washing. Instruction on Warning signs, infection symptoms, calling MD promptly, preventive modes, and value of vaccinations. Review of effective airway clearance, coughing and/or vibration techniques. Emphasizing that all should Create an Action Plan. Written material given at graduation. Flowsheet Row Pulmonary Rehab from 04/30/2021 in [facility] Cardiac and Pulmonary Rehab  Education need identified 03/24/21       AED/CPR: - Group verbal and written instruction with the use of models to demonstrate the basic use of the AED with the basic ABC's of resuscitation.    Anatomy and Cardiac Procedures: - Group verbal and visual presentation and models provide information about basic cardiac anatomy and function. Reviews the testing methods done to diagnose heart disease and the outcomes of the test results. Describes the treatment choices: Medical Management, Angioplasty, or Coronary Bypass Surgery for  treating various heart conditions including Myocardial Infarction, Angina, Valve Disease, and Cardiac Arrhythmias.  Written material given at graduation. Flowsheet Row Pulmonary Rehab from  04/30/2021 in [facility] Cardiac and Pulmonary Rehab  Date 04/02/21  Educator SB  Instruction Review Code 1- Verbalizes Understanding       Medication Safety: - Group verbal and visual instruction to review commonly prescribed medications for heart and lung disease. Reviews the medication, class of the drug, and side effects. Includes the steps to properly store meds and maintain the prescription regimen.  Written material given at graduation. Flowsheet Row Pulmonary Rehab from 04/30/2021 in [facility] Cardiac and Pulmonary Rehab  Date 04/22/21  Educator SB  Instruction Review Code 1- Verbalizes Understanding       Other: -Provides group and verbal instruction on various topics (see comments) Flowsheet Row PULMONARY REHAB CHRONIC OBSTRUCTIVE PULMONARY DISEASE from 12/09/2017 in [place]  Date 11/25/17  Educator [place]  Instruction Review Code 1- Verbalizes Understanding       Knowledge Questionnaire Score:  Knowledge Questionnaire Score - 03/24/21 1423       Knowledge Questionnaire Score   Pre Score 15/18              Core Components/Risk Factors/Patient Goals at Admission:  Personal Goals and Risk Factors at Admission - 03/24/21 1423       Core Components/Risk Factors/Patient Goals on Admission    Weight Management Yes;Weight Loss;Obesity    Intervention Weight Management: Develop a combined nutrition and exercise program designed to reach desired caloric intake, while maintaining appropriate intake of nutrient and fiber, sodium and fats, and appropriate energy expenditure required for the weight goal.;Weight Management: Provide education and appropriate resources to help participant work on and attain dietary goals.;Weight Management/Obesity: Establish reasonable short term and long term weight goals.;Obesity: Provide education and appropriate resources to help participant work on and attain dietary goals.     Admit Weight 219 lb 3.2 oz (99.4 kg)    Goal Weight: Short Term 214 lb (97.1 kg)    Goal Weight: Long Term 209 lb (94.8 kg)    Expected Outcomes Short Term: Continue to assess and modify interventions until short term weight is achieved;Long Term: Adherence to nutrition and physical activity/exercise program aimed toward attainment of established weight goal;Weight Maintenance: Understanding of the daily nutrition guidelines, which includes 25-35% calories from fat, 7% or less cal from saturated fats, less than 263m cholesterol, less than 1.5gm of sodium, & 5 or more servings of fruits and vegetables daily;Understanding recommendations for meals to include 15-35% energy as protein, 25-35% energy from fat, 35-60% energy from carbohydrates, less than 2034mof dietary cholesterol, 20-35 gm of total fiber daily;Understanding of distribution of calorie intake throughout the day with the consumption of 4-5 meals/snacks    Improve shortness of breath with ADL's Yes    Intervention Provide education, individualized exercise plan and daily activity instruction to help decrease symptoms of SOB with activities of daily living.    Expected Outcomes Short Term: Improve cardiorespiratory fitness to achieve a reduction of symptoms when performing ADLs;Long Term: Be able to perform more ADLs without symptoms or delay the onset of symptoms    Increase knowledge of respiratory medications and ability to use respiratory devices properly  Yes    Intervention Provide education and demonstration as needed of appropriate use of medications, inhalers, and oxygen therapy.    Expected Outcomes Short Term: Achieves understanding of medications use. Understands  that oxygen is a medication prescribed by physician. Demonstrates appropriate use of inhaler and oxygen therapy.;Long Term: Maintain appropriate use of medications, inhalers, and oxygen therapy.             Education:Diabetes - Individual verbal and written  instruction to review signs/symptoms of diabetes, desired ranges of glucose level fasting, after meals and with exercise. Acknowledge that pre and post exercise glucose checks will be done for 3 sessions at entry of program.   Know Your Numbers and Heart Failure: - Group verbal and visual instruction to discuss disease risk factors for cardiac and pulmonary disease and treatment options.  Reviews associated critical values for Overweight/Obesity, Hypertension, Cholesterol, and Diabetes.  Discusses basics of heart failure: signs/symptoms and treatments.  Introduces Heart Failure Zone chart for action plan for heart failure.  Written material given at graduation. Flowsheet Row Pulmonary Rehab from 04/30/2021 in [facility] Cardiac and Pulmonary Rehab  Date 04/30/21  Educator SB  Instruction Review Code 1- Verbalizes Understanding       Core Components/Risk Factors/Patient Goals Review:   Goals and Risk Factor Review     Row Name 04/07/21 0730             Core Components/Risk Factors/Patient Goals Review   Personal Goals Review Improve shortness of breath with ADL's       Review Spoke to patient about their shortness of breath and what they can do to improve. Patient has been informed of breathing techniques when starting the program. Patient is informed to tell staff if they have had any med changes and that certain meds they are taking or not taking can be causing shortness of breath.       Expected Outcomes Short: Attend [facility] regularly to improve shortness of breath with ADL's. Long: maintain independence with ADL's                Core Components/Risk Factors/Patient Goals at Discharge (Final Review):   Goals and Risk Factor Review - 04/07/21 0730       Core Components/Risk Factors/Patient Goals Review   Personal Goals Review Improve shortness of breath with ADL's    Review Spoke to patient about their shortness of breath and what they can do to improve. Patient has been informed  of breathing techniques when starting the program. Patient is informed to tell staff if they have had any med changes and that certain meds they are taking or not taking can be causing shortness of breath.    Expected Outcomes Short: Attend [facility] regularly to improve shortness of breath with ADL's. Long: maintain independence with ADL's             ITP Comments:  ITP Comments     Row Name 03/19/21 1051 03/24/21 1416 03/26/21 0722 04/02/21 0811 04/09/21 0954   ITP Comments Initial telephone orientation completed. Diagnosis can be found in CE 8/15. EP orientation scheduled for Monday 10/31 at 8am. Completed 6MWT and gym orientation. Initial ITP created and sent for review to Dr. Zetta Bills, Medical Director. First full day of exercise!  Patient was oriented to gym and equipment including functions, settings, policies, and procedures.  Patient's individual exercise prescription and treatment plan were reviewed.  All starting workloads were established based on the results of the 6 minute walk test done at initial orientation visit.  The plan for exercise progression was also introduced and progression will be customized based on patient's performance and goals. 30 Day review completed. Medical Director ITP review done, changes  made as directed, and signed approval by Medical Director.   New to program Completed initial RD consultation    [place] Name 04/30/21 0749           ITP Comments 30 Day review completed. Medical Director ITP review done, changes made as directed, and signed approval by Medical Director.                Comments:

## 2021-04-30 NOTE — Progress Notes (Signed)
Daily Session Note  Patient Details  Name: Kevin Yang MRN: [id number] Date of Birth: [birth year] Referring Provider:   Flowsheet Row Pulmonary Rehab from 03/24/2021 in [facility] Cardiac and Pulmonary Rehab  Referring Provider [place], [place] MD       Encounter Date: 04/30/2021  Check In:  Session Check In - 04/30/21 0714       Check-In   Supervising physician immediately available to respond to emergencies See telemetry face sheet for immediately available ER MD    Location [facility]-Cardiac & Pulmonary Rehab    Staff Present Birdie Sons, MPA, Elveria Rising, BA, ACSM CEP, Exercise Physiologist;Joseph Tessie Fass, [place]    Virtual Visit No    Medication changes reported     No    Fall or balance concerns reported    No    Tobacco Cessation No Change    Warm-up and Cool-down Performed on first and last piece of equipment    Resistance Training Performed Yes    VAD Patient? No    PAD/SET Patient? No      Pain Assessment   Currently in Pain? No/denies                Social History   Tobacco Use  Smoking Status Former   Types: Cigars   Quit date: 09/07/1986   Years since quitting: 34.6  Smokeless Tobacco Never    Goals Met:  Independence with exercise equipment Exercise tolerated well No report of concerns or symptoms today Strength training completed today  Goals Unmet:  Not Applicable  Comments: Pt able to follow exercise prescription today without complaint.  Will continue to monitor for progression.    Dr. Emily Filbert is Medical Director for [facility].  Dr. Ottie Glazier is Medical Director for [facility] Pulmonary Rehabilitation.

## 2021-05-01 ENCOUNTER — Encounter: Payer: No Typology Code available for payment source | Attending: Critical Care Medicine

## 2021-05-01 DIAGNOSIS — R0609 Other forms of dyspnea: Secondary | ICD-10-CM | POA: Insufficient documentation

## 2021-05-01 NOTE — Progress Notes (Signed)
Daily Session Note  Patient Details  Name: Kevin Yang MRN: [id number] Date of Birth: 08/16/1950 Referring Provider:   Flowsheet Row Pulmonary Rehab from 03/24/2021 in [facility] Cardiac and Pulmonary Rehab  Referring Provider [place], [place] MD       Encounter Date: 05/01/2021  Check In:  Session Check In - 05/01/21 0719       Check-In   Supervising physician immediately available to respond to emergencies See telemetry face sheet for immediately available ER MD    Location [facility]-Cardiac & Pulmonary Rehab    Staff Present Birdie Sons, MPA, Mauricia Area, BS, ACSM CEP, Exercise Physiologist;Amanda Oletta Darter, BA, ACSM CEP, Exercise Physiologist    Virtual Visit No    Medication changes reported     No    Fall or balance concerns reported    No    Tobacco Cessation No Change    Warm-up and Cool-down Performed on first and last piece of equipment    Resistance Training Performed Yes    VAD Patient? No    PAD/SET Patient? No      Pain Assessment   Currently in Pain? No/denies                Social History   Tobacco Use  Smoking Status Former   Types: Cigars   Quit date: 09/07/1986   Years since quitting: 34.6  Smokeless Tobacco Never    Goals Met:  Independence with exercise equipment Exercise tolerated well No report of concerns or symptoms today Strength training completed today  Goals Unmet:  Not Applicable  Comments: Pt able to follow exercise prescription today without complaint.  Will continue to monitor for progression.    Dr. Emily Filbert is Medical Director for [place].  Dr. Ottie Glazier is Medical Director for [facility] Pulmonary Rehabilitation.

## 2021-05-05 ENCOUNTER — Other Ambulatory Visit: Payer: Self-pay

## 2021-05-05 ENCOUNTER — Encounter: Payer: No Typology Code available for payment source | Admitting: *Deleted

## 2021-05-05 DIAGNOSIS — R0609 Other forms of dyspnea: Secondary | ICD-10-CM

## 2021-05-05 NOTE — Progress Notes (Signed)
Daily Session Note  Patient Details  Name: Kevin Yang MRN: [id number] Date of Birth: March 27, 1951 Referring Provider:   Flowsheet Row Pulmonary Rehab from 03/24/2021 in [facility] Cardiac and Pulmonary Rehab  Referring Provider [place], [place] MD       Encounter Date: 05/05/2021  Check In:  Session Check In - 05/05/21 0806       Check-In   Supervising physician immediately available to respond to emergencies See telemetry face sheet for immediately available ER MD    Location [facility]Cardiac [facility] Pulmonary Rehab    Staff Present Heath Lark, RN, BSN, Laveda Norman, BS, ACSM CEP, Exercise Physiologist;Joseph [place], RCP,RRT,BSRT;Jessica [place], [place], RCEP, CCRP, CCET    Virtual Visit No    Medication changes reported     No    Fall or balance concerns reported    No    Warm-up and Cool-down Performed on first and last piece of equipment    Resistance Training Performed Yes    VAD Patient? No    PAD/SET Patient? No      Pain Assessment   Currently in Pain? No/denies                Social History   Tobacco Use  Smoking Status Former   Types: Cigars   Quit date: 09/07/1986   Years since quitting: 34.6  Smokeless Tobacco Never    Goals Met:  Proper associated with RPD/PD & O2 Sat Independence with exercise equipment Exercise tolerated well No report of concerns or symptoms today  Goals Unmet:  Not Applicable  Comments: Pt able to follow exercise prescription today without complaint.  Will continue to monitor for progression.    Dr. Emily Filbert is Medical Director for [place].  Dr. Ottie Glazier is Medical Director for [facility] Pulmonary Rehabilitation.

## 2021-05-07 ENCOUNTER — Other Ambulatory Visit: Payer: Self-pay

## 2021-05-07 DIAGNOSIS — R0609 Other forms of dyspnea: Secondary | ICD-10-CM | POA: Diagnosis not present

## 2021-05-07 NOTE — Progress Notes (Signed)
Daily Session Note  Patient Details  Name: Kevin Yang MRN: [id number] Date of Birth: 06-07-1950 Referring Provider:   Flowsheet Row Pulmonary Rehab from 03/24/2021 in [facility] Cardiac and Pulmonary Rehab  Referring Provider [place], [place] MD       Encounter Date: 05/07/2021  Check In:  Session Check In - 05/07/21 0711       Check-In   Supervising physician immediately available to respond to emergencies See telemetry face sheet for immediately available ER MD    Location [facility]Cardiac [facility] Pulmonary Rehab    Staff Present Birdie Sons, MPA, RN;Amanda Oletta Darter, BA, ACSM CEP, Exercise Physiologist;Melissa Caiola, RDN, LDN    Virtual Visit No    Medication changes reported     No    Fall or balance concerns reported    No    Tobacco Cessation No Change    Warm-up and Cool-down Performed on first and last piece of equipment    Resistance Training Performed Yes    VAD Patient? No    PAD/SET Patient? No      Pain Assessment   Currently in Pain? No/denies                Social History   Tobacco Use  Smoking Status Former   Types: Cigars   Quit date: 09/07/1986   Years since quitting: 34.6  Smokeless Tobacco Never    Goals Met:  Independence with exercise equipment Exercise tolerated well No report of concerns or symptoms today Strength training completed today  Goals Unmet:  Not Applicable  Comments: Pt able to follow exercise prescription today without complaint.  Will continue to monitor for progression.    Dr. Emily Filbert is Medical Director for [place].  Dr. Ottie Glazier is Medical Director for [facility] Pulmonary Rehabilitation.

## 2021-05-08 DIAGNOSIS — R0609 Other forms of dyspnea: Secondary | ICD-10-CM | POA: Diagnosis not present

## 2021-05-08 NOTE — Progress Notes (Signed)
Daily Session Note  Patient Details  Name: Kevin Yang MRN: [id number] Date of Birth: May 21, 1951 Referring Provider:   Flowsheet Row Pulmonary Rehab from 03/24/2021 in [facility] Cardiac and Pulmonary Rehab  Referring Provider [place], [place] MD       Encounter Date: 05/08/2021  Check In:  Session Check In - 05/08/21 0710       Check-In   Supervising physician immediately available to respond to emergencies See telemetry face sheet for immediately available ER MD    Location [facility]Card[facility] Pulmonary Rehab    Staff Present Birdie Sons, MPA, Mauricia Area, BS, ACSM CEP, Exercise Physiologist;Amanda Oletta Darter, BA, ACSM CEP, Exercise Physiologist    Virtual Visit No    Medication changes reported     No    Fall or balance concerns reported    No    Tobacco Cessation No Change    Warm-up and Cool-down Performed on first and last piece of equipment    Resistance Training Performed Yes    VAD Patient? No    PAD/SET Patient? No      Pain Assessment   Currently in Pain? No/denies                Social History   Tobacco Use  Smoking Status Former   Types: Cigars   Quit date: 09/07/1986   Years since quitting: 34.6  Smokeless Tobacco Never    Goals Met:  Independence with exercise equipment Exercise tolerated well No report of concerns or symptoms today Strength training completed today  Goals Unmet:  Not Applicable  Comments: Pt able to follow exercise prescription today without complaint.  Will continue to monitor for progression.    Dr. Emily Filbert is Medical Director for [place].  Dr. Ottie Glazier is Medical Director for [facility] Pulmonary Rehabilitation.

## 2021-05-12 ENCOUNTER — Other Ambulatory Visit: Payer: Self-pay

## 2021-05-12 DIAGNOSIS — R0609 Other forms of dyspnea: Secondary | ICD-10-CM | POA: Diagnosis not present

## 2021-05-12 NOTE — Progress Notes (Signed)
Daily Session Note  Patient Details  Name: Kevin Yang MRN: [id number] Date of Birth: [birth year] Referring Provider:   Flowsheet Row Pulmonary Rehab from 03/24/2021 in [facility] Cardiac and Pulmonary Rehab  Referring Provider [place], [place] MD       Encounter Date: 05/12/2021  Check In:  Session Check In - 05/12/21 3893       Check-In   Supervising physician immediately available to respond to emergencies See telemetry face sheet for immediately available ER MD    Location [facility]Cardiac & Pulmonary Rehab    Staff Present Birdie Sons, MPA, Mauricia Area, BS, ACSM CEP, Exercise Physiologist;Joseph Tessie Fass, [place]    Virtual Visit No    Medication changes reported     No    Fall or balance concerns reported    No    Tobacco Cessation No Change    Warm-up and Cool-down Performed on first and last piece of equipment    Resistance Training Performed Yes    VAD Patient? No    PAD/SET Patient? No      Pain Assessment   Currently in Pain? No/denies                Social History   Tobacco Use  Smoking Status Former   Types: Cigars   Quit date: 09/07/1986   Years since quitting: 34.7  Smokeless Tobacco Never    Goals Met:  Independence with exercise equipment Exercise tolerated well No report of concerns or symptoms today Strength training completed today  Goals Unmet:  Not Applicable  Comments: Pt able to follow exercise prescription today without complaint.  Will continue to monitor for progression.    Dr. Emily Filbert is Medical Director for [place].  Dr. Ottie Glazier is Medical Director for [facility] Pulmonary Rehabilitation.

## 2021-05-14 ENCOUNTER — Other Ambulatory Visit: Payer: Self-pay

## 2021-05-14 DIAGNOSIS — R0609 Other forms of dyspnea: Secondary | ICD-10-CM | POA: Diagnosis not present

## 2021-05-14 NOTE — Progress Notes (Signed)
Daily Session Note  Patient Details  Name: Kevin Yang MRN: [id number] Date of Birth: 1951-02-15 Referring Provider:   Flowsheet Row Pulmonary Rehab from 03/24/2021 in [facility] Cardiac and Pulmonary Rehab  Referring Provider [place], [place] MD       Encounter Date: 05/14/2021  Check In:  Session Check In - 05/14/21 0722       Check-In   Supervising physician immediately available to respond to emergencies See telemetry face sheet for immediately available ER MD    Location [facility]Cardiac & Pulmonary Rehab    Staff Present Birdie Sons, MPA, RN;Joseph [place], RCP,RRT,BSRT;Amanda [place], BA, ACSM CEP, Exercise Physiologist    Virtual Visit No    Medication changes reported     No    Fall or balance concerns reported    No    Tobacco Cessation No Change    Warm-up and Cool-down Performed on first and last piece of equipment    Resistance Training Performed Yes    VAD Patient? No    PAD/SET Patient? No      Pain Assessment   Currently in Pain? No/denies                Social History   Tobacco Use  Smoking Status Former   Types: Cigars   Quit date: 09/07/1986   Years since quitting: 34.7  Smokeless Tobacco Never    Goals Met:  Independence with exercise equipment Exercise tolerated well No report of concerns or symptoms today Strength training completed today  Goals Unmet:  Not Applicable  Comments: Pt able to follow exercise prescription today without complaint.  Will continue to monitor for progression.    Dr. Emily Filbert is Medical Director for [place].  Dr. Ottie Glazier is Medical Director for [facility] Pulmonary Rehabilitation.

## 2021-05-15 DIAGNOSIS — R0609 Other forms of dyspnea: Secondary | ICD-10-CM

## 2021-05-15 NOTE — Progress Notes (Signed)
Daily Session Note  Patient Details  Name: Kevin Yang MRN: [id number] Date of Birth: 09-18-1950 Referring Provider:   Flowsheet Row Pulmonary Rehab from 03/24/2021 in [facility] Cardiac and Pulmonary Rehab  Referring Provider [place], [place] MD       Encounter Date: 05/15/2021  Check In:  Session Check In - 05/15/21 0715       Check-In   Supervising physician immediately available to respond to emergencies See telemetry face sheet for immediately available ER MD    Location [facility]Cardiac & Pulmonary Rehab    Staff Present Birdie Sons, MPA, RN;Amanda Oletta Darter, BA, ACSM CEP, Exercise Physiologist;Kara Eliezer Bottom, MS, ASCM CEP, Exercise Physiologist    Virtual Visit No    Medication changes reported     No    Fall or balance concerns reported    No    Tobacco Cessation No Change    Warm-up and Cool-down Performed on first and last piece of equipment    Resistance Training Performed Yes    VAD Patient? No    PAD/SET Patient? No      Pain Assessment   Currently in Pain? No/denies                Social History   Tobacco Use  Smoking Status Former   Types: Cigars   Quit date: 09/07/1986   Years since quitting: 34.7  Smokeless Tobacco Never    Goals Met:  Independence with exercise equipment Exercise tolerated well No report of concerns or symptoms today Strength training completed today  Goals Unmet:  Not Applicable  Comments: Pt able to follow exercise prescription today without complaint.  Will continue to monitor for progression.    Dr. Emily Filbert is Medical Director for [place].  Dr. Ottie Glazier is Medical Director for [facility] Pulmonary Rehabilitation.

## 2021-05-19 ENCOUNTER — Other Ambulatory Visit: Payer: Self-pay

## 2021-05-19 ENCOUNTER — Encounter: Payer: No Typology Code available for payment source | Admitting: *Deleted

## 2021-05-19 DIAGNOSIS — R0609 Other forms of dyspnea: Secondary | ICD-10-CM

## 2021-05-19 NOTE — Progress Notes (Signed)
Daily Session Note  Patient Details  Name: Kevin Yang MRN: [id number] Date of Birth: [birth year] Referring Provider:   Flowsheet Row Pulmonary Rehab from 03/24/2021 in [facility] Cardiac and Pulmonary Rehab  Referring Provider Marion, [place] MD       Encounter Date: 05/19/2021  Check In:  Session Check In - 05/19/21 0809       Check-In   Supervising physician immediately available to respond to emergencies See telemetry face sheet for immediately available ER MD    Location [facility]-Cardiac & Pulmonary Rehab    Staff Present Heath Lark, RN, BSN, CCRP;Joseph [place], RCP,RRT,BSRT;Kelly Sterling, [place], ACSM CEP, Exercise Physiologist    Virtual Visit No    Fall or balance concerns reported    No    Warm-up and Cool-down Performed on first and last piece of equipment    Resistance Training Performed Yes    VAD Patient? No    PAD/SET Patient? No      Pain Assessment   Currently in Pain? No/denies                Social History   Tobacco Use  Smoking Status Former   Types: Cigars   Quit date: 09/07/1986   Years since quitting: 34.7  Smokeless Tobacco Never    Goals Met:  Proper associated with RPD/PD & O2 Sat Independence with exercise equipment Exercise tolerated well No report of concerns or symptoms today  Goals Unmet:  Not Applicable  Comments: Pt able to follow exercise prescription today without complaint.  Will continue to monitor for progression.    Dr. Emily Filbert is Medical Director for [place].  Dr. Ottie Glazier is Medical Director for [facility] Pulmonary Rehabilitation.

## 2021-05-21 ENCOUNTER — Other Ambulatory Visit: Payer: Self-pay

## 2021-05-21 DIAGNOSIS — R0609 Other forms of dyspnea: Secondary | ICD-10-CM

## 2021-05-21 NOTE — Progress Notes (Signed)
Daily Session Note  Patient Details  Name: Kevin Yang MRN: [id number] Date of Birth: 03/07/1951 Referring Provider:   Flowsheet Row Pulmonary Rehab from 03/24/2021 in [facility] Cardiac and Pulmonary Rehab  Referring Provider [place], [place] MD       Encounter Date: 05/21/2021  Check In:  Session Check In - 05/21/21 0732       Check-In   Supervising physician immediately available to respond to emergencies See telemetry face sheet for immediately available ER MD    Location [facility]Cardiac & Pulmonary Rehab    Staff Present Birdie Sons, MPA, Elveria Rising, BA, ACSM CEP, Exercise Physiologist;Joseph Tessie Fass, [place]    Virtual Visit No    Medication changes reported     No    Fall or balance concerns reported    No    Tobacco Cessation No Change    Warm-up and Cool-down Performed on first and last piece of equipment    Resistance Training Performed Yes    VAD Patient? No    PAD/SET Patient? No      Pain Assessment   Currently in Pain? No/denies                Social History   Tobacco Use  Smoking Status Former   Types: Cigars   Quit date: 09/07/1986   Years since quitting: 34.7  Smokeless Tobacco Never    Goals Met:  Independence with exercise equipment Exercise tolerated well No report of concerns or symptoms today Strength training completed today  Goals Unmet:  Not Applicable  Comments: Pt able to follow exercise prescription today without complaint.  Will continue to monitor for progression.    Dr. Emily Filbert is Medical Director for [place].  Dr. Ottie Glazier is Medical Director for [facility] Pulmonary Rehabilitation.

## 2021-05-22 DIAGNOSIS — R0609 Other forms of dyspnea: Secondary | ICD-10-CM

## 2021-05-22 NOTE — Progress Notes (Signed)
Daily Session Note  Patient Details  Name: Kevin Yang MRN: [id number] Date of Birth: 1950/11/18 Referring Provider:   Flowsheet Row Pulmonary Rehab from 03/24/2021 in [facility] Cardiac and Pulmonary Rehab  Referring Provider [place], [place] MD       Encounter Date: 05/22/2021  Check In:  Session Check In - 05/22/21 0710       Check-In   Supervising physician immediately available to respond to emergencies See telemetry face sheet for immediately available ER MD    Location [facility]-Cardiac & Pulmonary Rehab    Staff Present Birdie Sons, MPA, Mauricia Area, BS, ACSM CEP, Exercise Physiologist;Amanda Oletta Darter, BA, ACSM CEP, Exercise Physiologist    Virtual Visit No    Medication changes reported     No    Fall or balance concerns reported    No    Tobacco Cessation No Change    Warm-up and Cool-down Performed on first and last piece of equipment    Resistance Training Performed Yes    VAD Patient? No    PAD/SET Patient? No      Pain Assessment   Currently in Pain? No/denies                Social History   Tobacco Use  Smoking Status Former   Types: Cigars   Quit date: 09/07/1986   Years since quitting: 34.7  Smokeless Tobacco Never    Goals Met:  Independence with exercise equipment Exercise tolerated well No report of concerns or symptoms today Strength training completed today  Goals Unmet:  Not Applicable  Comments: Pt able to follow exercise prescription today without complaint.  Will continue to monitor for progression.    Dr. Emily Filbert is Medical Director for [place].  Dr. Ottie Glazier is Medical Director for [facility] Pulmonary Rehabilitation.

## 2021-05-27 ENCOUNTER — Encounter: Payer: No Typology Code available for payment source | Admitting: *Deleted

## 2021-05-27 ENCOUNTER — Other Ambulatory Visit: Payer: Self-pay

## 2021-05-27 DIAGNOSIS — R0609 Other forms of dyspnea: Secondary | ICD-10-CM | POA: Diagnosis not present

## 2021-05-27 NOTE — Progress Notes (Signed)
Daily Session Note  Patient Details  Name: Kevin Yang MRN: [id number] Date of Birth: 1950/08/05 Referring Provider:   Flowsheet Row Pulmonary Rehab from 03/24/2021 in [facility] Cardiac and Pulmonary Rehab  Referring Provider [place], [place] MD       Encounter Date: 05/27/2021  Check In:  Session Check In - 05/27/21 0729       Check-In   Supervising physician immediately available to respond to emergencies See telemetry face sheet for immediately available ER MD    Location [facility]-Cardiac & Pulmonary Rehab    Staff Present Heath Lark, RN, BSN, Lance Sell, BA, ACSM CEP, Exercise Physiologist;Laureen Owens Shark, BS, RRT, CPFT    Virtual Visit No    Medication changes reported     No    Fall or balance concerns reported    No    Warm-up and Cool-down Performed on first and last piece of equipment    Resistance Training Performed Yes    VAD Patient? No    PAD/SET Patient? No      Pain Assessment   Currently in Pain? No/denies                Social History   Tobacco Use  Smoking Status Former   Types: Cigars   Quit date: 09/07/1986   Years since quitting: 34.7  Smokeless Tobacco Never    Goals Met:  Proper associated with RPD/PD & O2 Sat Independence with exercise equipment Exercise tolerated well No report of concerns or symptoms today  Goals Unmet:  Not Applicable  Comments: Pt able to follow exercise prescription today without complaint.  Will continue to monitor for progression.    Dr. Emily Filbert is Medical Director for [place].  Dr. Ottie Glazier is Medical Director for [facility] Pulmonary Rehabilitation.

## 2021-05-28 ENCOUNTER — Encounter: Payer: Self-pay | Admitting: *Deleted

## 2021-05-28 DIAGNOSIS — R0609 Other forms of dyspnea: Secondary | ICD-10-CM

## 2021-05-28 NOTE — Progress Notes (Signed)
Pulmonary Individual Treatment Plan  Patient Details  Name: Kevin Yang MRN: [id number] Date of Birth: 06/16/1950 Referring Provider:   Flowsheet Row Pulmonary Rehab from 03/24/2021 in [facility] Cardiac and Pulmonary Rehab  Referring Provider Marion, [place] MD       Initial Encounter Date:  Flowsheet Row Pulmonary Rehab from 03/24/2021 in [facility] Cardiac and Pulmonary Rehab  Date 03/24/21       Visit Diagnosis: Dyspnea on exertion  Patient's Home Medications on Admission:  Current Outpatient Medications:    albuterol (PROVENTIL HFA;VENTOLIN HFA) 108 (90 BASE) MCG/ACT inhaler, Inhale 2 puffs into the lungs every 6 (six) hours as needed for wheezing or shortness of breath. , Disp: , Rfl:    atorvastatin (LIPITOR) 80 MG tablet, TAKE ONE TABLET BY MOUTH AT BEDTIME FOR CHOLESTEROL, Disp: , Rfl:    Benzoyl Peroxide 10 % LIQD, WASH AS DIRECTED AFFECTED AREA DAILY : APPLY TO THE SCALP, CHEST AND BACK DAILY IN THE SHOWER. RINSE OFF COMPLETELY. DRY OFF WITH WHITE TOWELS SINCE THIS WASH CAN BLEACH COLORS, Disp: , Rfl:    budesonide-formoterol (SYMBICORT) 160-4.5 MCG/ACT inhaler, Inhale 2 puffs into the lungs 2 (two) times daily., Disp: , Rfl:    Carboxymethylcellulose Sodium 1 % GEL, APPLY 1 DROP TO EACH EYE FOUR TIMES A DAY, Disp: , Rfl:    cetirizine (ZYRTEC) 10 MG tablet, Take 1 tablet by mouth daily., Disp: , Rfl:    diclofenac Sodium (VOLTAREN) 1 % GEL, APPLY 4 GRAMS TO AFFECTED AREA FOUR TIMES A DAY AS NEEDED, Disp: , Rfl:    dicyclomine (BENTYL) 10 MG capsule, TAKE ONE CAPSULE BY MOUTH TWICE A DAY BEFORE MEALS, Disp: , Rfl:    fluticasone (FLONASE) 50 MCG/ACT nasal spray, Place 1 spray into both nostrils as needed. 2 sprays each nostril once a day, Disp: , Rfl:    formoterol (PERFOROMIST) 20 MCG/2ML nebulizer solution, Take 20 mcg by nebulization 4 (four) times daily. (Patient not taking: Reported on 03/19/2021), Disp: , Rfl:    HYDROcodone-acetaminophen (NORCO) 10-325 MG tablet, TAKE ONE-HALF  TO ONE TABLET BY MOUTH TWICE A DAY AS NEEDED (9/63M,10/20M), Disp: , Rfl:    inFLIXimab-abda 100 MG SOLR, Inject into the vein every 6 (six) weeks.  (Patient not taking: Reported on 03/19/2021), Disp: , Rfl:    ipratropium-albuterol (DUONEB) 0.5-2.5 (3) MG/3ML SOLN, Inhale 3 mLs into the lungs every 4 (four) hours as needed (for wheezing).  (Patient not taking: Reported on 03/19/2021), Disp: , Rfl:    lisinopril (ZESTRIL) 10 MG tablet, Take 1 tablet by mouth daily., Disp: , Rfl:    loperamide (IMODIUM) 2 MG capsule, TAKE ONE CAPSULE BY MOUTH AS DIRECTED BY YOUR PROVIDER AS NEEDED    AFTER EACH LOOSE STOOL NO MORE THAN 4 TIMES PER DAY, Disp: , Rfl:    naloxone (NARCAN) nasal spray 4 mg/0.1 mL, SPRAY 1 SPRAY INTO ONE NOSTRIL AS DIRECTED FOR OPIOID OVERDOSE (TURN PERSON ON SIDE AFTER DOSE. IF NO RESPONSE IN 2-3 MINUTES OR PERSON RESPONDS BUT RELAPSES, REPEAT USING A NEW SPRAY DEVICE AND SPRAY INTO THE OTHER NOSTRIL. CALL 911 AFTER USE.) * EMERGENCY USE ONLY *, Disp: , Rfl:    omeprazole (PRILOSEC) 40 MG capsule, TAKE ONE CAPSULE BY MOUTH DAILY (TAKE ON AN EMPTY STOMACH 30 MINUTES PRIOR TO A MEAL), Disp: , Rfl:    predniSONE (DELTASONE) 10 MG tablet, TAKE TWO TABLETS BY MOUTH IN THE MORNING -CONTINUE THE 20 MG DAILY UNTIL FURTHER INSTRUCTED (TAKE WITH FOOD), Disp: , Rfl:  sildenafil (VIAGRA) 50 MG tablet, Take 1 tablet (50 mg total) by mouth as needed for erectile dysfunction. (Patient not taking: No sig reported), Disp: 10 tablet, Rfl: 2   simvastatin (ZOCOR) 80 MG tablet, Take 80 mg by mouth at bedtime. (Patient not taking: Reported on 03/19/2021), Disp: , Rfl:    tamsulosin (FLOMAX) 0.4 MG CAPS, Take 0.4 mg by mouth at bedtime., Disp: , Rfl:   Past Medical History: Past Medical History:  Diagnosis Date   Asthma    Colitis    COPD (chronic obstructive pulmonary disease) (HCC)    Crohn's disease ([place])     Tobacco Use: Social History   Tobacco Use  Smoking Status Former   Types: Cigars    Quit date: 09/07/1986   Years since quitting: 34.7  Smokeless Tobacco Never    Labs: Recent Review Flowsheet Data     Labs for ITP Cardiac and Pulmonary Rehab Latest Ref Rng & Units 04/22/2007 04/07/2009 04/08/2009 10/09/2015   Cholestrol 0 - 200 mg/dL 212(HH) - 270 .Marland Kitchen.(H) 282(H)   LDLCALC 0 - 99 mg/dL - - 206 .Marland Kitchen.(H) 189(H)   LDLDIRECT mg/dL 146.5 - - -   HDL >39.00 mg/dL 37.3(L) - 40 56.90   Trlycerides 0.0 - 149.0 mg/dL 113 - 122 180.0(H)   TCO2 0 - 100 mmol/L - 30 - -        Pulmonary Assessment Scores:  Pulmonary Assessment Scores     Row Name 03/24/21 1424         ADL UCSD   ADL Phase Entry     SOB Score total 90     Rest 2     Walk 4     Stairs 5     Bath 3     Dress 4     Shop 3       CAT Score   CAT Score 31       mMRC Score   mMRC Score 1              UCSD: Self-administered rating of dyspnea associated with activities of daily living (ADLs) 6-point scale (0 = "not at all" to 5 = "maximal or unable to do because of breathlessness")  Scoring Scores range from 0 to 120.  Minimally important difference is 5 units  CAT: CAT can identify the health impairment of COPD patients and is better correlated with disease progression.  CAT has a scoring range of zero to 40. The CAT score is classified into four groups of low (less than 10), medium (10 - 20), high (21-30) and very high (31-40) based on the impact level of disease on health status. A CAT score over 10 suggests significant symptoms.  A worsening CAT score could be explained by an exacerbation, poor medication adherence, poor inhaler technique, or progression of COPD or comorbid conditions.  CAT MCID is 2 points  mMRC: mMRC (Modified Medical Research Council) Dyspnea Scale is used to assess the degree of baseline functional disability in patients of respiratory disease due to dyspnea. No minimal important difference is established. A decrease in score of 1 point or greater is considered a positive  change.   Pulmonary Function Assessment:   Exercise Target Goals: Exercise Program Goal: Individual exercise prescription set using results from initial 6 min walk test and THRR while considering  patients activity barriers and safety.   Exercise Prescription Goal: Initial exercise prescription builds to 30-45 minutes a day of aerobic activity, 2-3 days per week.  Home  exercise guidelines will be given to patient during program as part of exercise prescription that the participant will acknowledge.  Education: Aerobic Exercise: - Group verbal and visual presentation on the components of exercise prescription. Introduces F.I.T.T principle from ACSM for exercise prescriptions.  Reviews F.I.T.T. principles of aerobic exercise including progression. Written material given at graduation. Flowsheet Row Pulmonary Rehab from 05/21/2021 in [facility] Cardiac and Pulmonary Rehab  Education need identified 03/24/21  Date 03/26/21  Educator [facility]  Instruction Review Code 1- Verbalizes Understanding       Education: Resistance Exercise: - Group verbal and visual presentation on the components of exercise prescription. Introduces F.I.T.T principle from ACSM for exercise prescriptions  Reviews F.I.T.T. principles of resistance exercise including progression. Written material given at graduation. Flowsheet Row Pulmonary Rehab from 05/21/2021 in [facility] Cardiac and Pulmonary Rehab  Date 04/02/21  Educator [facility]  Instruction Review Code 1- Verbalizes Understanding        Education: Exercise & Equipment Safety: - Individual verbal instruction and demonstration of equipment use and safety with use of the equipment. Flowsheet Row Pulmonary Rehab from 05/21/2021 in [facility] Cardiac and Pulmonary Rehab  Date 03/24/21  Educator [facility]  Instruction Review Code 1- Verbalizes Understanding       Education: Exercise Physiology & General Exercise Guidelines: - Group verbal and written instruction with models to review the  exercise physiology of the cardiovascular system and associated critical values. Provides general exercise guidelines with specific guidelines to those with heart or lung disease.  Flowsheet Row Pulmonary Rehab from 05/21/2021 in [facility] Cardiac and Pulmonary Rehab  Date 05/21/21  Educator [facility]  Instruction Review Code 1- Verbalizes Understanding       Education: Flexibility, Balance, Mind/Body Relaxation: - Group verbal and visual presentation with interactive activity on the components of exercise prescription. Introduces F.I.T.T principle from ACSM for exercise prescriptions. Reviews F.I.T.T. principles of flexibility and balance exercise training including progression. Also discusses the mind body connection.  Reviews various relaxation techniques to help reduce and manage stress (i.e. Deep breathing, progressive muscle relaxation, and visualization). Balance handout provided to take home. Written material given at graduation. Flowsheet Row Pulmonary Rehab from 05/21/2021 in [facility] Cardiac and Pulmonary Rehab  Date 04/09/21  Educator [facility]  Instruction Review Code 1- Verbalizes Understanding       Activity Barriers & Risk Stratification:  Activity Barriers & Cardiac Risk Stratification - 03/24/21 1420       Activity Barriers & Cardiac Risk Stratification   Activity Barriers Shortness of Breath;Back Problems;Deconditioning             6 Minute Walk:  6 Minute Walk     Row Name 03/24/21 1416         6 Minute Walk   Phase Initial     Distance 1282 feet     Walk Time 6 minutes     # of Rest Breaks 0     MPH 2.43     METS 2.84     RPE 16     Perceived Dyspnea  3     VO2 Peak 9.96     Symptoms Yes (comment)     Comments SOB     Resting HR 92 bpm     Resting BP 134/66     Resting Oxygen Saturation  92 %     Exercise Oxygen Saturation  during 6 min walk 84 %     Max Ex. HR 105 bpm     Max Ex. BP 138/74  2 Minute Post BP 138/70       Interval HR   1 Minute HR 104      2 Minute HR 103     3 Minute HR 97     4 Minute HR 105     5 Minute HR 89     6 Minute HR 90     2 Minute Post HR 98     Interval Heart Rate? Yes       Interval Oxygen   Interval Oxygen? Yes     Baseline Oxygen Saturation % 92 %     1 Minute Oxygen Saturation % 85 %     1 Minute Liters of Oxygen 0 L  Room Air     2 Minute Oxygen Saturation % 84 %     2 Minute Liters of Oxygen 0 L     3 Minute Oxygen Saturation % 85 %     3 Minute Liters of Oxygen 0 L     4 Minute Oxygen Saturation % 84 %     4 Minute Liters of Oxygen 0 L     5 Minute Oxygen Saturation % 85 %     5 Minute Liters of Oxygen 0 L     6 Minute Oxygen Saturation % 87 %     6 Minute Liters of Oxygen 0 L     2 Minute Post Oxygen Saturation % 94 %     2 Minute Post Liters of Oxygen 0 L             Oxygen Initial Assessment:  Oxygen Initial Assessment - 03/19/21 1035       Home Oxygen   Home Oxygen Device None    Sleep Oxygen Prescription CPAP    Home Exercise Oxygen Prescription None    Home Resting Oxygen Prescription None      Initial 6 min Walk   Oxygen Used None      Program Oxygen Prescription   Program Oxygen Prescription None      Intervention   Short Term Goals To learn and understand importance of maintaining oxygen saturations>88%;To learn and demonstrate proper pursed lip breathing techniques or other breathing techniques. ;To learn and demonstrate proper use of respiratory medications;To learn and understand importance of monitoring SPO2 with pulse oximeter and demonstrate accurate use of the pulse oximeter.    Long  Term Goals Verbalizes importance of monitoring SPO2 with pulse oximeter and return demonstration;Maintenance of O2 saturations>88%;Exhibits proper breathing techniques, such as pursed lip breathing or other method taught during program session;Compliance with respiratory medication             Oxygen Re-Evaluation:  Oxygen Re-Evaluation     Row Name 03/26/21 0724 04/07/21  0727 05/05/21 0741         Program Oxygen Prescription   Program Oxygen Prescription None None None       Home Oxygen   Home Oxygen Device None None None     Sleep Oxygen Prescription CPAP CPAP CPAP     Home Exercise Oxygen Prescription None None None     Home Resting Oxygen Prescription None None None     Compliance with Home Oxygen Use -- Yes Yes       Goals/Expected Outcomes   Short Term Goals To learn and understand importance of maintaining oxygen saturations>88%;To learn and demonstrate proper pursed lip breathing techniques or other breathing techniques. ;To learn and understand importance of monitoring SPO2 with pulse oximeter and demonstrate  accurate use of the pulse oximeter. To learn and understand importance of maintaining oxygen saturations>88%;To learn and understand importance of monitoring SPO2 with pulse oximeter and demonstrate accurate use of the pulse oximeter. To learn and understand importance of maintaining oxygen saturations>88%;To learn and understand importance of monitoring SPO2 with pulse oximeter and demonstrate accurate use of the pulse oximeter.;To learn and demonstrate proper pursed lip breathing techniques or other breathing techniques. ;To learn and demonstrate proper use of respiratory medications     Long  Term Goals Verbalizes importance of monitoring SPO2 with pulse oximeter and return demonstration;Maintenance of O2 saturations>88%;Exhibits proper breathing techniques, such as pursed lip breathing or other method taught during program session;Compliance with respiratory medication Maintenance of O2 saturations>88%;Verbalizes importance of monitoring SPO2 with pulse oximeter and return demonstration Verbalizes importance of monitoring SPO2 with pulse oximeter and return demonstration;Exhibits proper breathing techniques, such as pursed lip breathing or other method taught during program session;Maintenance of O2 saturations>88%;Compliance with respiratory  medication     Comments Reviewed PLB technique with pt.  Talked about how it works and it's importance in maintaining their exercise saturations. He has a pulse oximeter to check his oxygen saturation at home. Informed and explained why it is important to have one. Reviewed that oxygen saturations should be 88 percent and above. Patient is going to check his oxygen when at rest and on exertion. PLB reviewed with patient. He uses it often when he gets out of breath and to calm himself down. Kevin Yang is doing well in rehab.  He is compliant with his CPAP at night.  His is using his PLB to help control his breathing.  He uses his pulse oximeter to keep an eye on his saturations and they have been doing well.     Goals/Expected Outcomes Short: Become more profiecient at using PLB.   Long: Become independent at using PLB. Short: monitor oxygen at home with exertion. Long: maintain oxygen saturations above 88 percent independently. short: Continue to use PLB to help with breathing Long: Continued compliance              Oxygen Discharge (Final Oxygen Re-Evaluation):  Oxygen Re-Evaluation - 05/05/21 0741       Program Oxygen Prescription   Program Oxygen Prescription None      Home Oxygen   Home Oxygen Device None    Sleep Oxygen Prescription CPAP    Home Exercise Oxygen Prescription None    Home Resting Oxygen Prescription None    Compliance with Home Oxygen Use Yes      Goals/Expected Outcomes   Short Term Goals To learn and understand importance of maintaining oxygen saturations>88%;To learn and understand importance of monitoring SPO2 with pulse oximeter and demonstrate accurate use of the pulse oximeter.;To learn and demonstrate proper pursed lip breathing techniques or other breathing techniques. ;To learn and demonstrate proper use of respiratory medications    Long  Term Goals Verbalizes importance of monitoring SPO2 with pulse oximeter and return demonstration;Exhibits proper breathing  techniques, such as pursed lip breathing or other method taught during program session;Maintenance of O2 saturations>88%;Compliance with respiratory medication    Comments Kevin Yang is doing well in rehab.  He is compliant with his CPAP at night.  His is using his PLB to help control his breathing.  He uses his pulse oximeter to keep an eye on his saturations and they have been doing well.    Goals/Expected Outcomes short: Continue to use PLB to help with breathing Long: Continued compliance  Initial Exercise Prescription:  Initial Exercise Prescription - 03/24/21 1400       Date of Initial Exercise RX and Referring Provider   Date 03/24/21    Referring Provider Marion, [place] MD      Oxygen   Maintain Oxygen Saturation 88% or higher      Treadmill   MPH 2.2    Grade 0.5    Minutes 15    METs 2.84      Recumbant Bike   Level 2    RPM 50    Watts 26    Minutes 15    METs 2.8      REL-XR   Level 2    Speed 50    Minutes 15    METs 2.8      T5 Nustep   Level 2    SPM 80    Minutes 15    METs 2.8      Prescription Details   Frequency (times per week) 3    Duration Progress to 30 minutes of continuous aerobic without signs/symptoms of physical distress      Intensity   THRR 40-80% of Max Heartrate 116-140    Ratings of Perceived Exertion 11-13    Perceived Dyspnea 0-4      Progression   Progression Continue to progress workloads to maintain intensity without signs/symptoms of physical distress.      Resistance Training   Training Prescription Yes    Weight 3 lb    Reps 10-15             Perform Capillary Blood Glucose checks as needed.  Exercise Prescription Changes:   Exercise Prescription Changes     Row Name 04/02/21 0700 04/07/21 1400 04/21/21 1500 05/05/21 1300 05/19/21 1100     Response to Exercise   Blood Pressure (Admit) -- 132/68 152/70 144/68 124/74   Blood Pressure (Exercise) -- 166/70 138/68 -- --   Blood Pressure (Exit) --  130/72 146/70 128/72 122/76   Heart Rate (Admit) -- 93 bpm 84 bpm 78 bpm 93 bpm   Heart Rate (Exercise) -- 107 bpm 98 bpm 104 bpm 111 bpm   Heart Rate (Exit) -- 96 bpm 91 bpm 90 bpm 103 bpm   Oxygen Saturation (Admit) -- 92 % 94 % 94 % 94 %   Oxygen Saturation (Exercise) -- 88 % 93 % 93 % 93 %   Oxygen Saturation (Exit) -- 90 % 95 % 96 % 94 %   Rating of Perceived Exertion (Exercise) -- _0 Perceived Dyspnea (Exercise) -- _1 Symptoms -- SOB SOB SOB SOB   Duration -- Progress to 30 minutes of  aerobic without signs/symptoms of physical distress Continue with 30 min of aerobic exercise without signs/symptoms of physical distress. Continue with 30 min of aerobic exercise without signs/symptoms of physical distress. Continue with 30 min of aerobic exercise without signs/symptoms of physical distress.   Intensity -- THRR unchanged THRR unchanged THRR unchanged THRR unchanged     Progression   Progression -- Continue to progress workloads to maintain intensity without signs/symptoms of physical distress. Continue to progress workloads to maintain intensity without signs/symptoms of physical distress. Continue to progress workloads to maintain intensity without signs/symptoms of physical distress. Continue to progress workloads to maintain intensity without signs/symptoms of physical distress.   Average METs -- 2.6 3.29 3.73 3.34     Resistance Training   Training Prescription -- Yes Yes Yes Yes  Weight -- 4 lb 6 lb 6 lb 6 lb   Reps -- 10-15 10-15 10-15 10-15     Interval Training   Interval Training -- No No No No     Treadmill   MPH -- 2.2 2.5 2.5 2.7   Grade -- 0._0 1.5   Minutes -- _1 METs -- 2.84 3.26 3.26 3.63     Recumbant Bike   Level -- -- -- -- 5   Watts -- -- -- -- 39   Minutes -- -- -- -- 15   METs -- -- -- -- 2.81     NuStep   Level -- -- -- -- 4   Minutes -- -- -- -- 15   METs -- -- -- -- 1.9     REL-XR   Level -- _2 Minutes --  _3 METs -- -- 3.6 4.2 3.7     T5 Nustep   Level -- -- 4 -- 4   Minutes -- -- 15 -- 15   METs -- -- 2.6 -- 2     Home Exercise Plan   Plans to continue exercise at Home (comment)  walking,treadmill,weights Home (comment)  walking,treadmill,weights Home (comment)  walking,treadmill,weights Home (comment)  walking,treadmill,weights Home (comment)  walking,treadmill,weights   Frequency Add 2 additional days to program exercise sessions. Add 2 additional days to program exercise sessions. Add 2 additional days to program exercise sessions. Add 2 additional days to program exercise sessions. Add 2 additional days to program exercise sessions.   Initial Home Exercises Provided 04/02/21 04/02/21 04/02/21 04/02/21 04/02/21            Exercise Comments:   Exercise Comments     Row Name 03/26/21 3545           Exercise Comments First full day of exercise!  Patient was oriented to gym and equipment including functions, settings, policies, and procedures.  Patient's individual exercise prescription and treatment plan were reviewed.  All starting workloads were established based on the results of the 6 minute walk test done at initial orientation visit.  The plan for exercise progression was also introduced and progression will be customized based on patient's performance and goals.                Exercise Goals and Review:   Exercise Goals     Row Name 03/24/21 1422             Exercise Goals   Increase Physical Activity Yes       Intervention Provide advice, education, support and counseling about physical activity/exercise needs.;Develop an individualized exercise prescription for aerobic and resistive training based on initial evaluation findings, risk stratification, comorbidities and participant's personal goals.       Expected Outcomes Short Term: Attend rehab on a regular basis to increase amount of physical activity.;Long Term: Add in home exercise to make  exercise part of routine and to increase amount of physical activity.;Long Term: Exercising regularly at least 3-5 days a week.       Increase Strength and Stamina Yes       Intervention Provide advice, education, support and counseling about physical activity/exercise needs.;Develop an individualized exercise prescription for aerobic and resistive training based on initial evaluation findings, risk stratification, comorbidities and participant's personal goals.       Expected Outcomes Short Term: Increase workloads from initial exercise prescription for resistance, speed, and  METs.;Short Term: Perform resistance training exercises routinely during rehab and add in resistance training at home;Long Term: Improve cardiorespiratory fitness, muscular endurance and strength as measured by increased METs and functional capacity (6MWT)       Able to understand and use rate of perceived exertion (RPE) scale Yes       Intervention Provide education and explanation on how to use RPE scale       Expected Outcomes Short Term: Able to use RPE daily in rehab to express subjective intensity level;Long Term:  Able to use RPE to guide intensity level when exercising independently       Able to understand and use Dyspnea scale Yes       Intervention Provide education and explanation on how to use Dyspnea scale       Expected Outcomes Short Term: Able to use Dyspnea scale daily in rehab to express subjective sense of shortness of breath during exertion;Long Term: Able to use Dyspnea scale to guide intensity level when exercising independently       Knowledge and understanding of Target Heart Rate Range (THRR) Yes       Intervention Provide education and explanation of THRR including how the numbers were predicted and where they are located for reference       Expected Outcomes Short Term: Able to state/look up THRR;Short Term: Able to use daily as guideline for intensity in rehab;Long Term: Able to use THRR to govern  intensity when exercising independently       Able to check pulse independently Yes       Intervention Provide education and demonstration on how to check pulse in carotid and radial arteries.;Review the importance of being able to check your own pulse for safety during independent exercise       Expected Outcomes Short Term: Able to explain why pulse checking is important during independent exercise;Long Term: Able to check pulse independently and accurately       Understanding of Exercise Prescription Yes       Intervention Provide education, explanation, and written materials on patient's individual exercise prescription       Expected Outcomes Short Term: Able to explain program exercise prescription;Long Term: Able to explain home exercise prescription to exercise independently                Exercise Goals Re-Evaluation :  Exercise Goals Re-Evaluation     [place] Name 03/26/21 0723 04/02/21 0744 04/07/21 1417 04/21/21 1513 05/05/21 0730     Exercise Goal Re-Evaluation   Exercise Goals Review Increase Physical Activity;Able to understand and use rate of perceived exertion (RPE) scale;Knowledge and understanding of Target Heart Rate Range (THRR);Understanding of Exercise Prescription;Increase Strength and Stamina;Able to understand and use Dyspnea scale;Able to check pulse independently Increase Physical Activity;Able to understand and use rate of perceived exertion (RPE) scale;Knowledge and understanding of Target Heart Rate Range (THRR);Understanding of Exercise Prescription;Increase Strength and Stamina;Able to understand and use Dyspnea scale;Able to check pulse independently Increase Physical Activity;Increase Strength and Stamina Increase Physical Activity;Increase Strength and Stamina;Understanding of Exercise Prescription Increase Physical Activity;Increase Strength and Stamina;Understanding of Exercise Prescription   Comments Reviewed RPE and dyspnea scales, THR and program prescription  with pt today.  Pt voiced understanding and was given a copy of goals to take home. Reviewed home exercise with pt today.  Pt plans to walk and use treadmill at home for exercise.  He also has a total gym and pedal machine that he can use.  Reviewed  THR, pulse, RPE, sign and symptoms, pulse oximetery and when to call 911 or MD.  Also discussed weather considerations and indoor options.  Pt voiced understanding. Kevin Yang is tolerating exercise well and has increased to 4 lb for strength work.  He is at level 5 on the [place] is doing well in rehab.  His RPE on the XR has dropped to 10 so he should be ready to move up again.  We will continue to monitor his progress. Kevin Yang is doing well in rehab.  He asked to increase workloads today and we will start to work his way up.  He is no longer feeling as short of breath at home and able to do more things.  He is walking at home and doing some yard work with the leaves.   Expected Outcomes Short: Use RPE daily to regulate intensity. Long: Follow program prescription in THR. Short: Start to add in at least one extra day walking at home Long: Continue to exercise independently Short: attend consistently Long: continue to build stamina Short: Try increasing workloads Long: Continue to improve stamina. Short: Continue to bump up workloads Long: Continue to improve stamina.    [place] Name 05/05/21 1333 05/05/21 1335 05/19/21 1124         Exercise Goal Re-Evaluation   Exercise Goals Review Increase Physical Activity;Increase Strength and Stamina -- Increase Physical Activity;Increase Strength and Stamina     Comments Kevin Yang attends consistently.  He does not reach THR range.  Staff wil encourage him to continue to increase levels to reach HR range. Kevin Yang is doing well at rehab. He has increased to 2.7 speed and 1.5% incline on the treadmill as well as level 5 on the recumbant bike. Oxygen saturations are staying above 88%. Will continue to monitor.     Expected  Outcomes -- Short: work in [employer] range Long: increase average MET level Short: Increase watts on recumbant bike Long: Continue to increase overall strength and stamina              Discharge Exercise Prescription (Final Exercise Prescription Changes):  Exercise Prescription Changes - 05/19/21 1100       Response to Exercise   Blood Pressure (Admit) 124/74    Blood Pressure (Exit) 122/76    Heart Rate (Admit) 93 bpm    Heart Rate (Exercise) 111 bpm    Heart Rate (Exit) 103 bpm    Oxygen Saturation (Admit) 94 %    Oxygen Saturation (Exercise) 93 %    Oxygen Saturation (Exit) 94 %    Rating of Perceived Exertion (Exercise) 13    Perceived Dyspnea (Exercise) 2    Symptoms SOB    Duration Continue with 30 min of aerobic exercise without signs/symptoms of physical distress.    Intensity THRR unchanged      Progression   Progression Continue to progress workloads to maintain intensity without signs/symptoms of physical distress.    Average METs 3.34      Resistance Training   Training Prescription Yes    Weight 6 lb    Reps 10-15      Interval Training   Interval Training No      Treadmill   MPH 2.7    Grade 1.5    Minutes 15    METs 3.63      Recumbant Bike   Level 5    Watts 39    Minutes 15    METs 2.81      NuStep   Level  4    Minutes 15    METs 1.9      REL-XR   Level 5    Minutes 15    METs 3.7      T5 Nustep   Level 4    Minutes 15    METs 2      Home Exercise Plan   Plans to continue exercise at Home (comment)   walking,treadmill,weights   Frequency Add 2 additional days to program exercise sessions.    Initial Home Exercises Provided 04/02/21             Nutrition:  Target Goals: Understanding of nutrition guidelines, daily intake of sodium <1566m, cholesterol <2055m calories 30% from fat and 7% or less from saturated fats, daily to have 5 or more servings of fruits and vegetables.  Education: All About Nutrition: -Group instruction  provided by verbal, written material, interactive activities, discussions, models, and posters to present general guidelines for heart healthy nutrition including fat, fiber, MyPlate, the role of sodium in heart healthy nutrition, utilization of the nutrition label, and utilization of this knowledge for meal planning. Follow up email sent as well. Written material given at graduation. [facility]  Date 04/17/21  [facility] 1- Verbalizes Understanding       Biometrics:  Pre Biometrics - 03/24/21 1422       Pre Biometrics   Height 5' 9.5" (1.765 m)    Weight 219 lb 3.2 oz (99.4 kg)    BMI (Calculated) 31.92    Single Leg Stand 30 seconds              Nutrition Therapy Plan and Nutrition Goals:  Nutrition Therapy & Goals - 04/09/21 1014       Nutrition Therapy   Diet Heart healthy, low Na, pulmonary MNT    Drug/Food Interactions Statins/Certain Fruits    Protein (specify units) 80g    Fiber 30 grams    Whole Grain Foods 3 servings    Saturated Fats 12 max. grams    Fruits and Vegetables 8 servings/day    Sodium 1.5 grams      Personal Nutrition Goals   Nutrition Goal ST: limit fried foods/high fat meat to <1-2x/week LT: limit Na < 1.5g/day, limit saturated fat < 12g/day, 1/2 grains consumed as whole grains    Comments Patient is a [age].o. M admitted to rehab for primary dx of dyspnea on exertion. PMH of HCL, GERD, Asthma, COPD, ulcerative colitis. Relevant medications includes lipitor, zocor, prednisone, imodium. PYP: 42. He would like to cut back on greasy foods and include more baked food, he would like to eat more fish. B: [employer] coffee with sausage egg and cheese or buttermilk biscuit, cereal (lactaid and frosted flakes and honey nut cheerios) L: same as dinner D: He likes to grill and use airfyer. He reports eating a lot of baked chicken at restaurants, but will get fried  chicken at fast food. Every now and again will go out to get hotdogs. They enjoy broccoli, collards, lima beans, carrots, onions. Will do baked and sweet potatoes especially when going out to eat. Grains: white rice, mac and cheese. He will use mostly olive oil whencooking and some butter. He will salt food after eating, but not much. He will go out to eat 4x/week for lunch or dinner. Drinks: water, tea, soda (1), OJ. He has stable weight. Sometimes when his UC flaires  up he will lose weight from diahhrea - he was steady for 12 years, but in the last 3 years they have been switching around his medicaitons. He will stay away from spicy foods. Discussed heart healthy eating and pulmonary MNT.      Intervention Plan   Intervention Prescribe, educate and counsel regarding individualized specific dietary modifications aiming towards targeted core components such as weight, hypertension, lipid management, diabetes, heart failure and other comorbidities.    Expected Outcomes Short Term Goal: Understand basic principles of dietary content, such as calories, fat, sodium, cholesterol and nutrients.;Long Term Goal: Adherence to prescribed nutrition plan.;Short Term Goal: A plan has been developed with personal nutrition goals set during dietitian appointment.             Nutrition Assessments:  MEDIFICTS Score Key: ?70 Need to make dietary changes  40-70 Heart Healthy Diet ? 40 Therapeutic Level Cholesterol Diet  Flowsheet Row Pulmonary Rehab from 03/24/2021 in [facility] Cardiac and Pulmonary Rehab  Picture Your Plate Total Score on Admission 42      Picture Your Plate Scores: <40 Unhealthy dietary pattern with much room for improvement. 41-50 Dietary pattern unlikely to meet recommendations for good health and room for improvement. 51-60 More healthful dietary pattern, with some room for improvement.  >60 Healthy dietary pattern, although there may be some specific behaviors that could be improved.    Nutrition Goals Re-Evaluation:  Nutrition Goals Re-Evaluation     [place] Name 05/05/21 0735             Goals   Nutrition Goal ST: limit fried foods/high fat meat to <1-2x/week LT: limit Na < 1.5g/day, limit saturated fat < 12g/day, 1/2 grains consumed as whole grains       Comment Shalik is doing okay with his diet.  He is a little more aware of what he is eating.  He is trying to be more mindful with his eating but still enjoy holiday eating as it's only once a year.  He is cutting back on his fried foods and fatty meats.  He is also trying to watch his sodium more.  He is aiming for moderation this season.       Expected Outcome Short: Enjoy holiday foods in moderation Long: Conitnue to watch fatty foods and salt intake                Nutrition Goals Discharge (Final Nutrition Goals Re-Evaluation):  Nutrition Goals Re-Evaluation - 05/05/21 0735       Goals   Nutrition Goal ST: limit fried foods/high fat meat to <1-2x/week LT: limit Na < 1.5g/day, limit saturated fat < 12g/day, 1/2 grains consumed as whole grains    Comment Kaamil is doing okay with his diet.  He is a little more aware of what he is eating.  He is trying to be more mindful with his eating but still enjoy holiday eating as it's only once a year.  He is cutting back on his fried foods and fatty meats.  He is also trying to watch his sodium more.  He is aiming for moderation this season.    Expected Outcome Short: Enjoy holiday foods in moderation Long: Conitnue to watch fatty foods and salt intake             Psychosocial: Target Goals: Acknowledge presence or absence of significant depression and/or stress, maximize coping skills, provide positive support system. Participant is able to verbalize types and ability to use techniques and skills needed  for reducing stress and depression.   Education: Stress, Anxiety, and Depression - Group verbal and visual presentation to define topics covered.  Reviews how body  is impacted by stress, anxiety, and depression.  Also discusses healthy ways to reduce stress and to treat/manage anxiety and depression.  Written material given at graduation. Flowsheet Row Pulmonary Rehab from 05/21/2021 in [facility] Cardiac and Pulmonary Rehab  Date 05/14/21  Educator [facility]  Instruction Review Code 1- Verbalizes Understanding       Education: Sleep Hygiene -Provides group verbal and written instruction about how sleep can affect your health.  Define sleep hygiene, discuss sleep cycles and impact of sleep habits. Review good sleep hygiene tips.    Initial Review & Psychosocial Screening:  Initial Psych Review & Screening - 03/19/21 1041       Initial Review   Current issues with Current Sleep Concerns      Family Dynamics   Good Support System? Yes   church family, friends     Barriers   Psychosocial barriers to participate in program There are no identifiable barriers or psychosocial needs.;The patient should benefit from training in stress management and relaxation.      Screening Interventions   Interventions Encouraged to exercise;Provide feedback about the scores to participant;To provide support and resources with identified psychosocial needs    Expected Outcomes Short Term goal: Utilizing psychosocial counselor, staff and physician to assist with identification of specific Stressors or current issues interfering with healing process. Setting desired goal for each stressor or current issue identified.;Long Term Goal: Stressors or current issues are controlled or eliminated.;Short Term goal: Identification and review with participant of any Quality of Life or Depression concerns found by scoring the questionnaire.;Long Term goal: The participant improves quality of Life and PHQ9 Scores as seen by post scores and/or verbalization of changes             Quality of Life Scores:  Scores of 19 and below usually indicate a poorer quality of life in these areas.  A  difference of  2-3 points is a clinically meaningful difference.  A difference of 2-3 points in the total score of the Quality of Life Index has been associated with significant improvement in overall quality of life, self-image, physical symptoms, and general health in studies assessing change in quality of life.  PHQ-9: Recent Review Flowsheet Data     Depression screen [facility] 2/9 05/05/2021 04/07/2021 03/24/2021 02/14/2018 12/21/2017   Decreased Interest _0 0 0   Down, Depressed, Hopeless 0 1 2 0 0   PHQ - 2 Score _1 0 0   Altered sleeping 0  _2 -   Tired, decreased energy _3 -   Change in appetite 0 0 1 0 -   Feeling bad or failure about yourself  0 0 0 0 -   Trouble concentrating 0 0 1 0 -   Moving slowly or fidgety/restless 0 0 0 0 -   Suicidal thoughts 0 0 0 0 -   PHQ-9 Score _4 -   Difficult doing work/chores Somewhat difficult Somewhat difficult Very difficult Not difficult at all -      Interpretation of Total Score  Total Score Depression Severity:  1-4 = Minimal depression, 5-9 = Mild depression, 10-14 = Moderate depression, 15-19 = Moderately severe depression, 20-27 = Severe depression   Psychosocial Evaluation and Intervention:  Psychosocial Evaluation - 03/19/21 1042  Psychosocial Evaluation & Interventions   Interventions Encouraged to exercise with the program and follow exercise prescription    Comments Kevin Yang is coming to Pulmonary Rehab after worsening dyspnea. He has done the program before at [facility]. He is very motivated to work hard and tries not to let his health deter him. He relies on his church family and friends. He does report issues sleeping where he only gets about 4 hours of sleep before his back starts to bother him. He usually will get up and nap later during the day. Recently his breathing has gotten worse and his diverticulitis has come back. He is on medication to help with both and is hoping he can repair the hit his stamina  has taken.    Expected Outcomes Short: attend pulmonary rehab for education and exercise. Long: develop and maintain positive self care habtis.    Continue Psychosocial Services  Follow up required by staff             Psychosocial Re-Evaluation:  Psychosocial Re-Evaluation     [place] Name 04/07/21 0734 05/05/21 0733           Psychosocial Re-Evaluation   Current issues with Current Stress Concerns Current Stress Concerns;Current Depression      Comments Reviewed patient health questionnaire (PHQ-9) with patient for follow up. Previously, patients score indicated signs/symptoms of depression.  Reviewed to see if patient is improving symptom wise while in program.  Score improved and patient states that it is because he has been able to get out and exercise to improve his breathing. Keelan is doing well in rehab.  His PHQ is down to a 3.  He is feeling better in general and no longer as scared with his breathing as when he started the program.  His dog keeps him up a night barking at everything, but he usually sleeps well otherwise.  He denies any other major stressors.  They are trying to get his wife into a pulmonary program to help her too.      Expected Outcomes Short: Continue to attend [facility] regularly for regular exercise and social engagement. Long: Continue to improve symptoms and manage a positive mental state. Short: Continue to exercise for mental boost Long: Conitnue to help dog sleep too      Interventions -- Encouraged to attend Pulmonary Rehabilitation for the exercise;Stress management education;Relaxation education      Continue Psychosocial Services  Follow up required by staff Follow up required by staff               Psychosocial Discharge (Final Psychosocial Re-Evaluation):  Psychosocial Re-Evaluation - 05/05/21 0733       Psychosocial Re-Evaluation   Current issues with Current Stress Concerns;Current Depression    Comments Kevin Yang is doing well in rehab.   His PHQ is down to a 3.  He is feeling better in general and no longer as scared with his breathing as when he started the program.  His dog keeps him up a night barking at everything, but he usually sleeps well otherwise.  He denies any other major stressors.  They are trying to get his wife into a pulmonary program to help her too.    Expected Outcomes Short: Continue to exercise for mental boost Long: Conitnue to help dog sleep too    Interventions Encouraged to attend Pulmonary Rehabilitation for the exercise;Stress management education;Relaxation education    Continue Psychosocial Services  Follow up required by staff  Education: Education Goals: Education classes will be provided on a weekly basis, covering required topics. Participant will state understanding/return demonstration of topics presented.  Learning Barriers/Preferences:  Learning Barriers/Preferences - 03/19/21 1041       Learning Barriers/Preferences   Learning Barriers None    Learning Preferences Skilled Demonstration;Written Material             General Pulmonary Education Topics:  Infection Prevention: - Provides verbal and written material to individual with discussion of infection control including proper hand washing and proper equipment cleaning during exercise session. Flowsheet Row Pulmonary Rehab from 05/21/2021 in [facility] Cardiac and Pulmonary Rehab  Date 03/24/21  Educator [facility]  Instruction Review Code 1- Verbalizes Understanding       Falls Prevention: - Provides verbal and written material to individual with discussion of falls prevention and safety. Flowsheet Row Pulmonary Rehab from 05/21/2021 in [facility] Cardiac and Pulmonary Rehab  Date 03/24/21  Educator [facility]  Instruction Review Code 1- Verbalizes Understanding       Chronic Lung Disease Review: - Group verbal instruction with posters, models, PowerPoint presentations and videos,  to review new updates, new respiratory  medications, new advancements in procedures and treatments. Providing information on websites and "800" numbers for continued self-education. Includes information about supplement oxygen, available portable oxygen systems, continuous and intermittent flow rates, oxygen safety, concentrators, and Medicare reimbursement for oxygen. Explanation of Pulmonary Drugs, including class, frequency, complications, importance of spacers, rinsing mouth after steroid MDI's, and proper cleaning methods for nebulizers. Review of basic lung anatomy and physiology related to function, structure, and complications of lung disease. Review of risk factors. Discussion about methods for diagnosing sleep apnea and types of masks and machines for OSA. Includes a review of the use of types of environmental controls: home humidity, furnaces, filters, dust mite/pet prevention, HEPA vacuums. Discussion about weather changes, air quality and the benefits of nasal washing. Instruction on Warning signs, infection symptoms, calling MD promptly, preventive modes, and value of vaccinations. Review of effective airway clearance, coughing and/or vibration techniques. Emphasizing that all should Create an Action Plan. Written material given at graduation. Flowsheet Row Pulmonary Rehab from 05/21/2021 in [facility] Cardiac and Pulmonary Rehab  Education need identified 03/24/21  Date 05/07/21  Educator [facility]  Instruction Review Code 1- Verbalizes Understanding       AED/CPR: - Group verbal and written instruction with the use of models to demonstrate the basic use of the AED with the basic ABC's of resuscitation.    Anatomy and Cardiac Procedures: - Group verbal and visual presentation and models provide information about basic cardiac anatomy and function. Reviews the testing methods done to diagnose heart disease and the outcomes of the test results. Describes the treatment choices: Medical Management, Angioplasty, or Coronary Bypass Surgery for  treating various heart conditions including Myocardial Infarction, Angina, Valve Disease, and Cardiac Arrhythmias.  Written material given at graduation. Flowsheet Row Pulmonary Rehab from 05/21/2021 in [facility] Cardiac and Pulmonary Rehab  Date 04/02/21  Educator SB  Instruction Review Code 1- Verbalizes Understanding       Medication Safety: - Group verbal and visual instruction to review commonly prescribed medications for heart and lung disease. Reviews the medication, class of the drug, and side effects. Includes the steps to properly store meds and maintain the prescription regimen.  Written material given at graduation. Flowsheet Row Pulmonary Rehab from 05/21/2021 in [facility] Cardiac and Pulmonary Rehab  Date 04/22/21  Educator SB  Instruction Review Code 1- Verbalizes Understanding  Other: -Provides group and verbal instruction on various topics (see comments) Flowsheet Row PULMONARY REHAB CHRONIC OBSTRUCTIVE PULMONARY DISEASE from 12/09/2017 in [place]  Date 11/25/17  Educator [place]  Instruction Review Code 1- Verbalizes Understanding       Knowledge Questionnaire Score:  Knowledge Questionnaire Score - 03/24/21 1423       Knowledge Questionnaire Score   Pre Score 15/18              Core Components/Risk Factors/Patient Goals at Admission:  Personal Goals and Risk Factors at Admission - 03/24/21 1423       Core Components/Risk Factors/Patient Goals on Admission    Weight Management Yes;Weight Loss;Obesity    Intervention Weight Management: Develop a combined nutrition and exercise program designed to reach desired caloric intake, while maintaining appropriate intake of nutrient and fiber, sodium and fats, and appropriate energy expenditure required for the weight goal.;Weight Management: Provide education and appropriate resources to help participant work on and attain dietary  goals.;Weight Management/Obesity: Establish reasonable short term and long term weight goals.;Obesity: Provide education and appropriate resources to help participant work on and attain dietary goals.    Admit Weight 219 lb 3.2 oz (99.4 kg)    Goal Weight: Short Term 214 lb (97.1 kg)    Goal Weight: Long Term 209 lb (94.8 kg)    Expected Outcomes Short Term: Continue to assess and modify interventions until short term weight is achieved;Long Term: Adherence to nutrition and physical activity/exercise program aimed toward attainment of established weight goal;Weight Maintenance: Understanding of the daily nutrition guidelines, which includes 25-35% calories from fat, 7% or less cal from saturated fats, less than 23m cholesterol, less than 1.5gm of sodium, & 5 or more servings of fruits and vegetables daily;Understanding recommendations for meals to include 15-35% energy as protein, 25-35% energy from fat, 35-60% energy from carbohydrates, less than 2045mof dietary cholesterol, 20-35 gm of total fiber daily;Understanding of distribution of calorie intake throughout the day with the consumption of 4-5 meals/snacks    Improve shortness of breath with ADL's Yes    Intervention Provide education, individualized exercise plan and daily activity instruction to help decrease symptoms of SOB with activities of daily living.    Expected Outcomes Short Term: Improve cardiorespiratory fitness to achieve a reduction of symptoms when performing ADLs;Long Term: Be able to perform more ADLs without symptoms or delay the onset of symptoms    Increase knowledge of respiratory medications and ability to use respiratory devices properly  Yes    Intervention Provide education and demonstration as needed of appropriate use of medications, inhalers, and oxygen therapy.    Expected Outcomes Short Term: Achieves understanding of medications use. Understands that oxygen is a medication prescribed by physician. Demonstrates  appropriate use of inhaler and oxygen therapy.;Long Term: Maintain appropriate use of medications, inhalers, and oxygen therapy.             Education:Diabetes - Individual verbal and written instruction to review signs/symptoms of diabetes, desired ranges of glucose level fasting, after meals and with exercise. Acknowledge that pre and post exercise glucose checks will be done for 3 sessions at entry of program.   Know Your Numbers and Heart Failure: - Group verbal and visual instruction to discuss disease risk factors for cardiac and pulmonary disease and treatment options.  Reviews associated critical values for Overweight/Obesity, Hypertension, Cholesterol, and Diabetes.  Discusses basics of heart failure: signs/symptoms and treatments.  Introduces Heart Failure Zone chart for action plan for heart failure.  Written material given at graduation. Flowsheet Row Pulmonary Rehab from 05/21/2021 in [facility] Cardiac and Pulmonary Rehab  Date 04/30/21  Educator SB  Instruction Review Code 1- Verbalizes Understanding       Core Components/Risk Factors/Patient Goals Review:   Goals and Risk Factor Review     Row Name 04/07/21 0730 05/05/21 0737           Core Components/Risk Factors/Patient Goals Review   Personal Goals Review Improve shortness of breath with ADL's Improve shortness of breath with ADL's;Weight Management/Obesity;Lipids;Increase knowledge of respiratory medications and ability to use respiratory devices properly.      Review Spoke to patient about their shortness of breath and what they can do to improve. Patient has been informed of breathing techniques when starting the program. Patient is informed to tell staff if they have had any med changes and that certain meds they are taking or not taking can be causing shortness of breath. Christiaan is doing well in rehab. His weight is holding steady.  He is watching his diet some. His breathing has gotten better around the house and he  is able to do more around the house without getting as short of breath.  He continues to feel better with his exercise and working on weight loss.  He is doing well with his inhalers and feeling good with medications overall.      Expected Outcomes Short: Attend [facility] regularly to improve shortness of breath with ADLs. Long: maintain independence with ADLs Short: Continue to work on weight loss LOng: Continue to exercise to improve breathing.               Core Components/Risk Factors/Patient Goals at Discharge (Final Review):   Goals and Risk Factor Review - 05/05/21 0737       Core Components/Risk Factors/Patient Goals Review   Personal Goals Review Improve shortness of breath with ADL's;Weight Management/Obesity;Lipids;Increase knowledge of respiratory medications and ability to use respiratory devices properly.    Review Garris is doing well in rehab. His weight is holding steady.  He is watching his diet some. His breathing has gotten better around the house and he is able to do more around the house without getting as short of breath.  He continues to feel better with his exercise and working on weight loss.  He is doing well with his inhalers and feeling good with medications overall.    Expected Outcomes Short: Continue to work on weight loss LOng: Continue to exercise to improve breathing.             ITP Comments:  ITP Comments     Row Name 03/19/21 1051 03/24/21 1416 03/26/21 0722 04/02/21 0811 04/09/21 0954   ITP Comments Initial telephone orientation completed. Diagnosis can be found in CE 8/15. EP orientation scheduled for Monday 10/31 at 8am. Completed 6MWT and gym orientation. Initial ITP created and sent for review to Dr. Zetta Bills, Medical Director. First full day of exercise!  Patient was oriented to gym and equipment including functions, settings, policies, and procedures.  Patient's individual exercise prescription and treatment plan were reviewed.  All  starting workloads were established based on the results of the 6 minute walk test done at initial orientation visit.  The plan for exercise progression was also introduced and progression will be customized based on patient's performance and goals. 30 Day review completed. Medical Director ITP review done, changes made as directed,  and signed approval by Medical Director.   New to program Completed initial RD consultation    Row Name 04/30/21 0749 05/28/21 1116         ITP Comments 30 Day review completed. Medical Director ITP review done, changes made as directed, and signed approval by Medical Director. 30 Day review completed. Medical Director ITP review done, changes made as directed, and signed approval by Medical Director.               Comments:

## 2021-05-29 ENCOUNTER — Other Ambulatory Visit: Payer: Self-pay

## 2021-05-29 DIAGNOSIS — R0609 Other forms of dyspnea: Secondary | ICD-10-CM

## 2021-05-29 NOTE — Progress Notes (Signed)
Daily Session Note  Patient Details  Name: Kevin Yang MRN: [id number] Date of Birth: 10-06-1950 Referring Provider:   Flowsheet Row Pulmonary Rehab from 03/24/2021 in [facility] Cardiac and Pulmonary Rehab  Referring Provider Marion, [place] MD       Encounter Date: 05/29/2021  Check In:  Session Check In - 05/29/21 0718       Check-In   Supervising physician immediately available to respond to emergencies See telemetry face sheet for immediately available ER MD    Location [facility]-Cardiac & Pulmonary Rehab    Staff Present Will Bonnet, RN,BC,MSN;Joseph [place], RCP,RRT,BSRT;Amanda McCoy, BA, ACSM CEP, Exercise Physiologist;Jessica [place], [place], RCEP, CCRP, CCET    Virtual Visit No    Medication changes reported     No    Fall or balance concerns reported    No    Tobacco Cessation No Change    Warm-up and Cool-down Performed on first and last piece of equipment    Resistance Training Performed Yes    VAD Patient? No    PAD/SET Patient? No      Pain Assessment   Currently in Pain? No/denies                Social History   Tobacco Use  Smoking Status Former   Types: Cigars   Quit date: 09/07/1986   Years since quitting: 34.7  Smokeless Tobacco Never    Goals Met:  Independence with exercise equipment Exercise tolerated well No report of concerns or symptoms today  Goals Unmet:  Not Applicable  Comments: Pt able to follow exercise prescription today without complaint.  Will continue to monitor for progression.    Dr. Emily Filbert is Medical Director for [place].  Dr. Ottie Glazier is Medical Director for [facility] Pulmonary Rehabilitation.

## 2021-05-30 ENCOUNTER — Other Ambulatory Visit: Payer: Self-pay

## 2021-05-30 ENCOUNTER — Encounter: Payer: No Typology Code available for payment source | Admitting: *Deleted

## 2021-05-30 DIAGNOSIS — R0609 Other forms of dyspnea: Secondary | ICD-10-CM

## 2021-05-30 NOTE — Progress Notes (Signed)
Daily Session Note  Patient Details  Name: Kevin Yang MRN: [id number] Date of Birth: Aug 26, 1950 Referring Provider:   Flowsheet Row Pulmonary Rehab from 03/24/2021 in [facility] Cardiac and Pulmonary Rehab  Referring Provider Marion, [place] MD       Encounter Date: 05/30/2021  Check In:  Session Check In - 05/30/21 0754       Check-In   Supervising physician immediately available to respond to emergencies See telemetry face sheet for immediately available ER MD    Location [facility]-Cardiac & Pulmonary Rehab    Staff Present Heath Lark, RN, BSN, CCRP;Joseph [place], RCP,RRT,BSRT;Jessica [place], [place], [place], CCRP, CCET    Virtual Visit No    Medication changes reported     No    Fall or balance concerns reported    No    Warm-up and Cool-down Performed on first and last piece of equipment    Resistance Training Performed Yes    VAD Patient? No    PAD/SET Patient? No      Pain Assessment   Currently in Pain? No/denies                Social History   Tobacco Use  Smoking Status Former   Types: Cigars   Quit date: 09/07/1986   Years since quitting: 34.7  Smokeless Tobacco Never    Goals Met:  Proper associated with RPD/PD & O2 Sat Independence with exercise equipment Exercise tolerated well No report of concerns or symptoms today  Goals Unmet:  Not Applicable  Comments: Pt able to follow exercise prescription today without complaint.  Will continue to monitor for progression.    Dr. Emily Filbert is Medical Director for [place].  Dr. Ottie Glazier is Medical Director for [facility] Pulmonary Rehabilitation.

## 2021-06-04 ENCOUNTER — Encounter: Payer: No Typology Code available for payment source | Attending: Critical Care Medicine

## 2021-06-04 ENCOUNTER — Other Ambulatory Visit: Payer: Self-pay

## 2021-06-04 DIAGNOSIS — R0609 Other forms of dyspnea: Secondary | ICD-10-CM | POA: Diagnosis present

## 2021-06-04 NOTE — Progress Notes (Signed)
Daily Session Note  Patient Details  Name: Kevin Yang MRN: [id number] Date of Birth: 1950/08/14 Referring Provider:   Flowsheet Row Pulmonary Rehab from 03/24/2021 in [facility] Cardiac and Pulmonary Rehab  Referring Provider Marion, [place] MD       Encounter Date: 06/04/2021  Check In:  Session Check In - 06/04/21 0725       Check-In   Supervising physician immediately available to respond to emergencies See telemetry face sheet for immediately available ER MD    Location [facility]-Cardiac & Pulmonary Rehab    Staff Present Birdie Sons, MPA, RN;Joseph [place], RCP,RRT,BSRT;Amanda [place], BA, ACSM CEP, Exercise Physiologist    Virtual Visit No    Medication changes reported     No    Fall or balance concerns reported    No    Tobacco Cessation No Change    Warm-up and Cool-down Performed on first and last piece of equipment    Resistance Training Performed Yes    VAD Patient? No    PAD/SET Patient? No      Pain Assessment   Currently in Pain? No/denies                Social History   Tobacco Use  Smoking Status Former   Types: Cigars   Quit date: 09/07/1986   Years since quitting: 34.7  Smokeless Tobacco Never    Goals Met:  Independence with exercise equipment Exercise tolerated well No report of concerns or symptoms today Strength training completed today  Goals Unmet:  Not Applicable  Comments: Pt able to follow exercise prescription today without complaint.  Will continue to monitor for progression.    Dr. Emily Filbert is Medical Director for [place].  Dr. Ottie Glazier is Medical Director for [facility] Pulmonary Rehabilitation.

## 2021-06-05 VITALS — Ht 69.5 in | Wt 224.5 lb

## 2021-06-05 DIAGNOSIS — R0609 Other forms of dyspnea: Secondary | ICD-10-CM | POA: Diagnosis not present

## 2021-06-05 NOTE — Progress Notes (Signed)
Daily Session Note  Patient Details  Name: Kevin Yang MRN: [id number] Date of Birth: [birth year] Referring Provider:   Flowsheet Row Pulmonary Rehab from 03/24/2021 in [facility] Cardiac and Pulmonary Rehab  Referring Provider Marion, [place] MD       Encounter Date: 06/05/2021  Check In:  Session Check In - 06/05/21 0174       Check-In   Supervising physician immediately available to respond to emergencies See telemetry face sheet for immediately available ER MD    Location [facility]-Cardiac & Pulmonary Rehab    Staff Present Birdie Sons, MPA, RN;Amanda Oletta Darter, BA, ACSM CEP, Exercise Physiologist;Kara Eliezer Bottom, MS, ASCM CEP, Exercise Physiologist    Virtual Visit No    Medication changes reported     No    Fall or balance concerns reported    No    Tobacco Cessation No Change    Warm-up and Cool-down Performed on first and last piece of equipment    Resistance Training Performed Yes    VAD Patient? No    PAD/SET Patient? No      Pain Assessment   Currently in Pain? No/denies                Social History   Tobacco Use  Smoking Status Former   Types: Cigars   Quit date: 09/07/1986   Years since quitting: 34.7  Smokeless Tobacco Never    Goals Met:  Independence with exercise equipment Exercise tolerated well No report of concerns or symptoms today Strength training completed today  Goals Unmet:  Not Applicable  Comments: Pt able to follow exercise prescription today without complaint.  Will continue to monitor for progression.   [place] Name 03/24/21 1416 06/05/21 0755       6 Minute Walk   Phase Initial Discharge    Distance 1282 feet 1470 feet    Distance % Change -- 14.7 %    Distance Feet Change -- 188 ft    Walk Time 6 minutes 6 minutes    # of Rest Breaks 0 0    MPH 2.43 2.78    METS 2.84 3.53    RPE 16 13    Perceived Dyspnea  3 2    VO2 Peak 9.96 12.35    Symptoms Yes (comment) No    Comments SOB SOB    Resting HR 92 bpm 87  bpm    Resting BP 134/66 146/84    Resting Oxygen Saturation  92 % 97 %    Exercise Oxygen Saturation  during 6 min walk 84 % 85 %    Max Ex. HR 105 bpm 111 bpm    Max Ex. BP 138/74 186/84    2 Minute Post BP 138/70 154/74      Interval HR   1 Minute HR 104 94    2 Minute HR 103 104    3 Minute HR 97 105    4 Minute HR 105 106    5 Minute HR 89 111    6 Minute HR 90 108    2 Minute Post HR 98 83    Interval Heart Rate? Yes Yes      Interval Oxygen   Interval Oxygen? Yes Yes    Baseline Oxygen Saturation % 92 % 97 %    1 Minute Oxygen Saturation % 85 % 88 %    1 Minute Liters of Oxygen 0 L  Room Air 0 L  Room  Air    2 Minute Oxygen Saturation % 84 % 88 %    2 Minute Liters of Oxygen 0 L 0 L    3 Minute Oxygen Saturation % 85 % 86 %    3 Minute Liters of Oxygen 0 L 0 L    4 Minute Oxygen Saturation % 84 % 86 %    4 Minute Liters of Oxygen 0 L 0 L    5 Minute Oxygen Saturation % 85 % 85 %    5 Minute Liters of Oxygen 0 L 0 L    6 Minute Oxygen Saturation % 87 % 85 %    6 Minute Liters of Oxygen 0 L 0 L    2 Minute Post Oxygen Saturation % 94 % 94 %    2 Minute Post Liters of Oxygen 0 L 0 L              Dr. Emily Filbert is Medical Director for [place].  Dr. Ottie Glazier is Medical Director for [facility] Pulmonary Rehabilitation.

## 2021-06-05 NOTE — Patient Instructions (Signed)
Discharge Patient Instructions  Patient Details  Name: Kevin Yang MRN: [id number] Date of Birth: [birth year] Referring Provider:  Marion, [place] Ryan, DO   Number of Visits: 36  Reason for Discharge:  Patient reached a stable level of exercise. Patient independent in their exercise. Patient has met program and personal goals.  Smoking History:  Social History   Tobacco Use  Smoking Status Former   Types: Cigars   Quit date: 09/07/1986   Years since quitting: 34.7  Smokeless Tobacco Never    Diagnosis:  Dyspnea on exertion  Initial Exercise Prescription:  Initial Exercise Prescription - 03/24/21 1400       Date of Initial Exercise RX and Referring Provider   Date 03/24/21    Referring Provider Marion, [place] MD      Oxygen   Maintain Oxygen Saturation 88% or higher      Treadmill   MPH 2.2    Grade 0.5    Minutes 15    METs 2.84      Recumbant Bike   Level 2    RPM 50    Watts 26    Minutes 15    METs 2.8      REL-XR   Level 2    Speed 50    Minutes 15    METs 2.8      T5 Nustep   Level 2    SPM 80    Minutes 15    METs 2.8      Prescription Details   Frequency (times per week) 3    Duration Progress to 30 minutes of continuous aerobic without signs/symptoms of physical distress      Intensity   THRR 40-80% of Max Heartrate 116-140    Ratings of Perceived Exertion 11-13    Perceived Dyspnea 0-4      Progression   Progression Continue to progress workloads to maintain intensity without signs/symptoms of physical distress.      Resistance Training   Training Prescription Yes    Weight 3 lb    Reps 10-15             Discharge Exercise Prescription (Final Exercise Prescription Changes):  Exercise Prescription Changes - 06/03/21 1400       Response to Exercise   Blood Pressure (Admit) 138/70    Blood Pressure (Exit) 126/70    Heart Rate (Admit) 105 bpm    Heart Rate (Exercise) 118 bpm    Heart Rate (Exit) 104 bpm    Oxygen  Saturation (Admit) 94 %    Oxygen Saturation (Exercise) 87 %    Oxygen Saturation (Exit) 96 %    Rating of Perceived Exertion (Exercise) 14    Perceived Dyspnea (Exercise) 2    Symptoms SOB    Duration Continue with 30 min of aerobic exercise without signs/symptoms of physical distress.    Intensity THRR unchanged      Progression   Progression Continue to progress workloads to maintain intensity without signs/symptoms of physical distress.    Average METs 3.5      Resistance Training   Training Prescription Yes    Weight 6 lb    Reps 10-15             Functional Capacity:  6 Minute Walk     Row Name 03/24/21 1416 06/05/21 0755       6 Minute Walk   Phase Initial Discharge    Distance 1282 feet 1470 feet    Distance %  Change -- 14.7 %    Distance Feet Change -- 188 ft    Walk Time 6 minutes 6 minutes    # of Rest Breaks 0 0    MPH 2.43 2.78    METS 2.84 3.53    RPE 16 13    Perceived Dyspnea  3 2    VO2 Peak 9.96 12.35    Symptoms Yes (comment) No    Comments SOB SOB    Resting HR 92 bpm 87 bpm    Resting BP 134/66 146/84    Resting Oxygen Saturation  92 % 97 %    Exercise Oxygen Saturation  during 6 min walk 84 % 85 %    Max Ex. HR 105 bpm 111 bpm    Max Ex. BP 138/74 186/84    2 Minute Post BP 138/70 154/74      Interval HR   1 Minute HR 104 94    2 Minute HR 103 104    3 Minute HR 97 105    4 Minute HR 105 106    5 Minute HR 89 111    6 Minute HR 90 108    2 Minute Post HR 98 83    Interval Heart Rate? Yes Yes      Interval Oxygen   Interval Oxygen? Yes Yes    Baseline Oxygen Saturation % 92 % 97 %    1 Minute Oxygen Saturation % 85 % 88 %    1 Minute Liters of Oxygen 0 L  Room Air 0 L  Room Air    2 Minute Oxygen Saturation % 84 % 88 %    2 Minute Liters of Oxygen 0 L 0 L    3 Minute Oxygen Saturation % 85 % 86 %    3 Minute Liters of Oxygen 0 L 0 L    4 Minute Oxygen Saturation % 84 % 86 %    4 Minute Liters of Oxygen 0 L 0 L    5 Minute  Oxygen Saturation % 85 % 85 %    5 Minute Liters of Oxygen 0 L 0 L    6 Minute Oxygen Saturation % 87 % 85 %    6 Minute Liters of Oxygen 0 L 0 L    2 Minute Post Oxygen Saturation % 94 % 94 %    2 Minute Post Liters of Oxygen 0 L 0 L              Nutrition & Weight - Outcomes:  Pre Biometrics - 03/24/21 1422       Pre Biometrics   Height 5' 9.5" (1.765 m)    Weight 219 lb 3.2 oz (99.4 kg)    BMI (Calculated) 31.92    Single Leg Stand 30 seconds             Post Biometrics - 06/05/21 0756        Post  Biometrics   Height 5' 9.5" (1.765 m)    Weight 224 lb 8 oz (101.8 kg)    BMI (Calculated) 32.69    Single Leg Stand 11.2 seconds             Nutrition:  Nutrition Therapy & Goals - 04/09/21 1014       Nutrition Therapy   Diet Heart healthy, low Na, pulmonary MNT    Drug/Food Interactions Statins/Certain Fruits    Protein (specify units) 80g    Fiber 30 grams  Whole Grain Foods 3 servings    Saturated Fats 12 max. grams    Fruits and Vegetables 8 servings/day    Sodium 1.5 grams      Personal Nutrition Goals   Nutrition Goal ST: limit fried foods/high fat meat to <1-2x/week LT: limit Na < 1.5g/day, limit saturated fat < 12g/day, 1/2 grains consumed as whole grains    Comments Patient is a 71 y.o. M admitted to rehab for primary dx of dyspnea on exertion. PMH of HCL, GERD, Asthma, COPD, ulcerative colitis. Relevant medications includes lipitor, zocor, prednisone, imodium. PYP: 42. He would like to cut back on greasy foods and include more baked food, he would like to eat more fish. B: McDonalds coffee with sausage egg and cheese or buttermilk biscuit, cereal (lactaid and frosted flakes and honey nut cheerios) L: same as dinner D: He likes to grill and use airfyer. He reports eating a lot of baked chicken at restaurants, but will get fried chicken at fast food. Every now and again will go out to get hotdogs. They enjoy broccoli, collards, lima beans, carrots,  onions. Will do baked and sweet potatoes especially when going out to eat. Grains: white rice, mac and cheese. He will use mostly olive oil whencooking and some butter. He will salt food after eating, but not much. He will go out to eat 4x/week for lunch or dinner. Drinks: water, tea, soda (1), OJ. He has stable weight. Sometimes when his UC flaires up he will lose weight from diahhrea - he was steady for 12 years, but in the last 3 years they have been switching around his medicaitons. He will stay away from spicy foods. Discussed heart healthy eating and pulmonary MNT.      Intervention Plan   Intervention Prescribe, educate and counsel regarding individualized specific dietary modifications aiming towards targeted core components such as weight, hypertension, lipid management, diabetes, heart failure and other comorbidities.    Expected Outcomes Short Term Goal: Understand basic principles of dietary content, such as calories, fat, sodium, cholesterol and nutrients.;Long Term Goal: Adherence to prescribed nutrition plan.;Short Term Goal: A plan has been developed with personal nutrition goals set during dietitian appointment.              Goals reviewed with patient; copy given to patient.

## 2021-06-11 ENCOUNTER — Other Ambulatory Visit: Payer: Self-pay

## 2021-06-11 DIAGNOSIS — R0609 Other forms of dyspnea: Secondary | ICD-10-CM | POA: Diagnosis not present

## 2021-06-11 NOTE — Progress Notes (Signed)
Daily Session Note  Patient Details  Name: Kevin Yang MRN: [id number] Date of Birth: [birth year] Referring Provider:   Flowsheet Row Pulmonary Rehab from 03/24/2021 in [facility] Cardiac and Pulmonary Rehab  Referring Provider Marion, [place] MD       Encounter Date: 06/11/2021  Check In:  Session Check In - 06/11/21 0711       Check-In   Supervising physician immediately available to respond to emergencies See telemetry face sheet for immediately available ER MD    Location [facility]-Cardiac & Pulmonary Rehab    Staff Present Birdie Sons, MPA, RN;Joseph [place], [place], MA, RCEP, CCRP, CCET    Virtual Visit No    Medication changes reported     No    Fall or balance concerns reported    No    Tobacco Cessation No Change    Warm-up and Cool-down Performed on first and last piece of equipment    Resistance Training Performed Yes    VAD Patient? No    PAD/SET Patient? No      Pain Assessment   Currently in Pain? No/denies                Social History   Tobacco Use  Smoking Status Former   Types: Cigars   Quit date: 09/07/1986   Years since quitting: 34.7  Smokeless Tobacco Never    Goals Met:  Independence with exercise equipment Exercise tolerated well No report of concerns or symptoms today Strength training completed today  Goals Unmet:  Not Applicable  Comments: Pt able to follow exercise prescription today without complaint.  Will continue to monitor for progression.    Dr. Emily Filbert is Medical Director for [place].  Dr. Ottie Glazier is Medical Director for [facility] Pulmonary Rehabilitation.

## 2021-06-12 DIAGNOSIS — R0609 Other forms of dyspnea: Secondary | ICD-10-CM | POA: Diagnosis not present

## 2021-06-12 NOTE — Progress Notes (Signed)
Daily Session Note  Patient Details  Name: Kevin Yang MRN: [id number] Date of Birth: 01/16/1951 Referring Provider:   Flowsheet Row Pulmonary Rehab from 03/24/2021 in [facility] Cardiac and Pulmonary Rehab  Referring Provider Marion, [place] MD       Encounter Date: 06/12/2021  Check In:  Session Check In - 06/12/21 0727       Check-In   Supervising physician immediately available to respond to emergencies See telemetry face sheet for immediately available ER MD    Location [facility]-Cardiac & Pulmonary Rehab    Staff Present Birdie Sons, MPA, RN;Jessica Luan [place], MA, RCEP, CCRP, CCET    Virtual Visit No    Medication changes reported     No    Fall or balance concerns reported    No    Tobacco Cessation No Change    Warm-up and Cool-down Performed on first and last piece of equipment    Resistance Training Performed Yes    VAD Patient? No    PAD/SET Patient? No      Pain Assessment   Currently in Pain? No/denies                Social History   Tobacco Use  Smoking Status Former   Types: Cigars   Quit date: 09/07/1986   Years since quitting: 34.7  Smokeless Tobacco Never    Goals Met:  Independence with exercise equipment Exercise tolerated well No report of concerns or symptoms today Strength training completed today  Goals Unmet:  Not Applicable  Comments: Pt able to follow exercise prescription today without complaint.  Will continue to monitor for progression.    Dr. Emily Filbert is Medical Director for [place].  Dr. Ottie Glazier is Medical Director for [facility] Pulmonary Rehabilitation.

## 2021-06-16 ENCOUNTER — Other Ambulatory Visit: Payer: Self-pay

## 2021-06-16 ENCOUNTER — Encounter: Payer: No Typology Code available for payment source | Admitting: *Deleted

## 2021-06-16 DIAGNOSIS — R0609 Other forms of dyspnea: Secondary | ICD-10-CM

## 2021-06-16 NOTE — Progress Notes (Signed)
Daily Session Note  Patient Details  Name: Kevin Yang MRN: [id number] Date of Birth: 1950-10-25 Referring Provider:   Flowsheet Row Pulmonary Rehab from 03/24/2021 in [facility]  Referring Provider [place], [place] MD       Encounter Date: 06/16/2021  Check In:  Session Check In - 06/16/21 0804       Check-In   Supervising physician immediately available to respond to emergencies See telemetry face sheet for immediately available ER MD    Staff Present Earlean Shawl, BS, ACSM CEP, Exercise Physiologist;Joseph Iaeger, [place];Heath Lark, RN, BSN, CCRP    Virtual Visit No    Medication changes reported     No    Fall or balance concerns reported    No    Warm-up and Cool-down Performed on first and last piece of equipment    Resistance Training Performed Yes    VAD Patient? No    PAD/SET Patient? No      Pain Assessment   Currently in Pain? No/denies                Social History   Tobacco Use  Smoking Status Former   Types: Cigars   Quit date: 09/07/1986   Years since quitting: 34.7  Smokeless Tobacco Never    Goals Met:  Independence with exercise equipment Exercise tolerated well No report of concerns or symptoms today  Goals Unmet:  Not Applicable  Comments: Pt able to follow exercise prescription today without complaint.  Will continue to monitor for progression.    Dr. Emily Filbert is Medical Director for [facility].  Dr. Ottie Glazier is Medical Director for [facility] Pulmonary Rehabilitation.

## 2021-06-18 ENCOUNTER — Other Ambulatory Visit: Payer: Self-pay

## 2021-06-18 DIAGNOSIS — R0609 Other forms of dyspnea: Secondary | ICD-10-CM

## 2021-06-18 NOTE — Progress Notes (Signed)
Pulmonary Individual Treatment Plan  Patient Details  Name: Kevin Yang MRN: [id number] Date of Birth: 06/16/1950 Referring Provider:   Flowsheet Row Pulmonary Rehab from 03/24/2021 in [facility] Cardiac and Pulmonary Rehab  Referring Provider Marion, [place] MD       Initial Encounter Date:  Flowsheet Row Pulmonary Rehab from 03/24/2021 in [facility] Cardiac and Pulmonary Rehab  Date 03/24/21       Visit Diagnosis: Dyspnea on exertion  Patient's Home Medications on Admission:  Current Outpatient Medications:    albuterol (PROVENTIL HFA;VENTOLIN HFA) 108 (90 BASE) MCG/ACT inhaler, Inhale 2 puffs into the lungs every 6 (six) hours as needed for wheezing or shortness of breath. , Disp: , Rfl:    atorvastatin (LIPITOR) 80 MG tablet, TAKE ONE TABLET BY MOUTH AT BEDTIME FOR CHOLESTEROL, Disp: , Rfl:    Benzoyl Peroxide 10 % LIQD, WASH AS DIRECTED AFFECTED AREA DAILY : APPLY TO THE SCALP, CHEST AND BACK DAILY IN THE SHOWER. RINSE OFF COMPLETELY. DRY OFF WITH WHITE TOWELS SINCE THIS WASH CAN BLEACH COLORS, Disp: , Rfl:    budesonide-formoterol (SYMBICORT) 160-4.5 MCG/ACT inhaler, Inhale 2 puffs into the lungs 2 (two) times daily., Disp: , Rfl:    Carboxymethylcellulose Sodium 1 % GEL, APPLY 1 DROP TO EACH EYE FOUR TIMES A DAY, Disp: , Rfl:    cetirizine (ZYRTEC) 10 MG tablet, Take 1 tablet by mouth daily., Disp: , Rfl:    diclofenac Sodium (VOLTAREN) 1 % GEL, APPLY 4 GRAMS TO AFFECTED AREA FOUR TIMES A DAY AS NEEDED, Disp: , Rfl:    dicyclomine (BENTYL) 10 MG capsule, TAKE ONE CAPSULE BY MOUTH TWICE A DAY BEFORE MEALS, Disp: , Rfl:    fluticasone (FLONASE) 50 MCG/ACT nasal spray, Place 1 spray into both nostrils as needed. 2 sprays each nostril once a day, Disp: , Rfl:    formoterol (PERFOROMIST) 20 MCG/2ML nebulizer solution, Take 20 mcg by nebulization 4 (four) times daily. (Patient not taking: Reported on 03/19/2021), Disp: , Rfl:    HYDROcodone-acetaminophen (NORCO) 10-325 MG tablet, TAKE ONE-HALF  TO ONE TABLET BY MOUTH TWICE A DAY AS NEEDED (9/63M,10/20M), Disp: , Rfl:    inFLIXimab-abda 100 MG SOLR, Inject into the vein every 6 (six) weeks.  (Patient not taking: Reported on 03/19/2021), Disp: , Rfl:    ipratropium-albuterol (DUONEB) 0.5-2.5 (3) MG/3ML SOLN, Inhale 3 mLs into the lungs every 4 (four) hours as needed (for wheezing).  (Patient not taking: Reported on 03/19/2021), Disp: , Rfl:    lisinopril (ZESTRIL) 10 MG tablet, Take 1 tablet by mouth daily., Disp: , Rfl:    loperamide (IMODIUM) 2 MG capsule, TAKE ONE CAPSULE BY MOUTH AS DIRECTED BY YOUR PROVIDER AS NEEDED    AFTER EACH LOOSE STOOL NO MORE THAN 4 TIMES PER DAY, Disp: , Rfl:    naloxone (NARCAN) nasal spray 4 mg/0.1 mL, SPRAY 1 SPRAY INTO ONE NOSTRIL AS DIRECTED FOR OPIOID OVERDOSE (TURN PERSON ON SIDE AFTER DOSE. IF NO RESPONSE IN 2-3 MINUTES OR PERSON RESPONDS BUT RELAPSES, REPEAT USING A NEW SPRAY DEVICE AND SPRAY INTO THE OTHER NOSTRIL. CALL 911 AFTER USE.) * EMERGENCY USE ONLY *, Disp: , Rfl:    omeprazole (PRILOSEC) 40 MG capsule, TAKE ONE CAPSULE BY MOUTH DAILY (TAKE ON AN EMPTY STOMACH 30 MINUTES PRIOR TO A MEAL), Disp: , Rfl:    predniSONE (DELTASONE) 10 MG tablet, TAKE TWO TABLETS BY MOUTH IN THE MORNING -CONTINUE THE 20 MG DAILY UNTIL FURTHER INSTRUCTED (TAKE WITH FOOD), Disp: , Rfl:  sildenafil (VIAGRA) 50 MG tablet, Take 1 tablet (50 mg total) by mouth as needed for erectile dysfunction. (Patient not taking: No sig reported), Disp: 10 tablet, Rfl: 2   simvastatin (ZOCOR) 80 MG tablet, Take 80 mg by mouth at bedtime. (Patient not taking: Reported on 03/19/2021), Disp: , Rfl:    tamsulosin (FLOMAX) 0.4 MG CAPS, Take 0.4 mg by mouth at bedtime., Disp: , Rfl:   Past Medical History: Past Medical History:  Diagnosis Date   Asthma    Colitis    COPD (chronic obstructive pulmonary disease) (HCC)    [place]'s [place])     Tobacco Use: Social History   Tobacco Use  Smoking Status Former   Types: Cigars    Quit date: 09/07/1986   Years since quitting: 34.8  Smokeless Tobacco Never    Labs: Recent Review Flowsheet Data     Labs for ITP Cardiac and Pulmonary Rehab Latest Ref Rng & Units 04/22/2007 04/07/2009 04/08/2009 10/09/2015   Cholestrol 0 - 200 mg/dL 212(HH) - 270 .Marland Kitchen.(H) 282(H)   LDLCALC 0 - 99 mg/dL - - 206 .Marland Kitchen.(H) 189(H)   LDLDIRECT mg/dL 146.5 - - -   HDL >39.00 mg/dL 37.3(L) - 40 56.90   Trlycerides 0.0 - 149.0 mg/dL 113 - 122 180.0(H)   TCO2 0 - 100 mmol/L - 30 - -        Pulmonary Assessment Scores:  Pulmonary Assessment Scores     Row Name 03/24/21 1424 06/12/21 0805       ADL UCSD   ADL Phase Entry Exit    SOB Score total 90 57    Rest 2 1    Walk 4 2    Stairs 5 4    Bath 3 1    Dress 4 1    Shop 3 2      CAT Score   CAT Score 31 13      mMRC Score   mMRC Score 1 1             UCSD: Self-administered rating of dyspnea associated with activities of daily living (ADLs) 6-point scale (0 = "not at all" to 5 = "maximal or unable to do because of breathlessness")  Scoring Scores range from 0 to 120.  Minimally important difference is 5 units  CAT: CAT can identify the health impairment of COPD patients and is better correlated with disease progression.  CAT has a scoring range of zero to 40. The CAT score is classified into four groups of low (less than 10), medium (10 - 20), high (21-30) and very high (31-40) based on the impact level of disease on health status. A CAT score over 10 suggests significant symptoms.  A worsening CAT score could be explained by an exacerbation, poor medication adherence, poor inhaler technique, or progression of COPD or comorbid conditions.  CAT MCID is 2 points  mMRC: mMRC (Modified Medical Research Council) Dyspnea Scale is used to assess the degree of baseline functional disability in patients of respiratory disease due to dyspnea. No minimal important difference is established. A decrease in score of 1 point or greater is  considered a positive change.   Pulmonary Function Assessment:   Exercise Target Goals: Exercise Program Goal: Individual exercise prescription set using results from initial 6 min walk test and THRR while considering  patients activity barriers and safety.   Exercise Prescription Goal: Initial exercise prescription builds to 30-45 minutes a day of aerobic activity, 2-3 days per week.  Home  exercise guidelines will be given to patient during program as part of exercise prescription that the participant will acknowledge.  Education: Aerobic Exercise: - Group verbal and visual presentation on the components of exercise prescription. Introduces F.I.T.T principle from ACSM for exercise prescriptions.  Reviews F.I.T.T. principles of aerobic exercise including progression. Written material given at graduation. Flowsheet Row Pulmonary Rehab from 05/29/2021 in [facility] Cardiac and Pulmonary Rehab  Education need identified 03/24/21  Date 05/29/21  Educator [facility]  Instruction Review Code 1- Verbalizes Understanding       Education: Resistance Exercise: - Group verbal and visual presentation on the components of exercise prescription. Introduces F.I.T.T principle from ACSM for exercise prescriptions  Reviews F.I.T.T. principles of resistance exercise including progression. Written material given at graduation. Flowsheet Row Pulmonary Rehab from 05/29/2021 in [facility] Cardiac and Pulmonary Rehab  Date 04/02/21  Educator [facility]  Instruction Review Code 1- Verbalizes Understanding        Education: Exercise & Equipment Safety: - Individual verbal instruction and demonstration of equipment use and safety with use of the equipment. Flowsheet Row Pulmonary Rehab from 05/29/2021 in [facility] Cardiac and Pulmonary Rehab  Date 03/24/21  Educator [facility]  Instruction Review Code 1- Verbalizes Understanding       Education: Exercise Physiology & General Exercise Guidelines: - Group verbal and written instruction with  models to review the exercise physiology of the cardiovascular system and associated critical values. Provides general exercise guidelines with specific guidelines to those with heart or lung disease.  Flowsheet Row Pulmonary Rehab from 05/29/2021 in [facility] Cardiac and Pulmonary Rehab  Date 05/21/21  Educator [facility]  Instruction Review Code 1- Verbalizes Understanding       Education: Flexibility, Balance, Mind/Body Relaxation: - Group verbal and visual presentation with interactive activity on the components of exercise prescription. Introduces F.I.T.T principle from ACSM for exercise prescriptions. Reviews F.I.T.T. principles of flexibility and balance exercise training including progression. Also discusses the mind body connection.  Reviews various relaxation techniques to help reduce and manage stress (i.e. Deep breathing, progressive muscle relaxation, and visualization). Balance handout provided to take home. Written material given at graduation. Flowsheet Row Pulmonary Rehab from 05/29/2021 in [facility] Cardiac and Pulmonary Rehab  Date 04/09/21  Educator [facility]  Instruction Review Code 1- Verbalizes Understanding       Activity Barriers & Risk Stratification:  Activity Barriers & Cardiac Risk Stratification - 03/24/21 1420       Activity Barriers & Cardiac Risk Stratification   Activity Barriers Shortness of Breath;Back Problems;Deconditioning             6 Minute Walk:  6 Minute Walk     Row Name 03/24/21 1416 06/05/21 0755       6 Minute Walk   Phase Initial Discharge    Distance 1282 feet 1470 feet    Distance % Change -- 14.7 %    Distance Feet Change -- 188 ft    Walk Time 6 minutes 6 minutes    # of Rest Breaks 0 0    MPH 2.43 2.78    METS 2.84 3.53    RPE 16 13    Perceived Dyspnea  3 2    VO2 Peak 9.96 12.35    Symptoms Yes (comment) No    Comments SOB SOB    Resting HR 92 bpm 87 bpm    Resting BP 134/66 146/84    Resting Oxygen Saturation  92 % 97 %     Exercise Oxygen Saturation  during 6  min walk 84 % 85 %    Max Ex. HR 105 bpm 111 bpm    Max Ex. BP 138/74 186/84    2 Minute Post BP 138/70 154/74      Interval HR   1 Minute HR 104 94    2 Minute HR 103 104    3 Minute HR 97 105    4 Minute HR 105 106    5 Minute HR 89 111    6 Minute HR 90 108    2 Minute Post HR 98 83    Interval Heart Rate? Yes Yes      Interval Oxygen   Interval Oxygen? Yes Yes    Baseline Oxygen Saturation % 92 % 97 %    1 Minute Oxygen Saturation % 85 % 88 %    1 Minute Liters of Oxygen 0 L  Room Air 0 L  Room Air    2 Minute Oxygen Saturation % 84 % 88 %    2 Minute Liters of Oxygen 0 L 0 L    3 Minute Oxygen Saturation % 85 % 86 %    3 Minute Liters of Oxygen 0 L 0 L    4 Minute Oxygen Saturation % 84 % 86 %    4 Minute Liters of Oxygen 0 L 0 L    5 Minute Oxygen Saturation % 85 % 85 %    5 Minute Liters of Oxygen 0 L 0 L    6 Minute Oxygen Saturation % 87 % 85 %    6 Minute Liters of Oxygen 0 L 0 L    2 Minute Post Oxygen Saturation % 94 % 94 %    2 Minute Post Liters of Oxygen 0 L 0 L            Oxygen Initial Assessment:  Oxygen Initial Assessment - 03/19/21 1035       Home Oxygen   Home Oxygen Device None    Sleep Oxygen Prescription CPAP    Home Exercise Oxygen Prescription None    Home Resting Oxygen Prescription None      Initial 6 min Walk   Oxygen Used None      Program Oxygen Prescription   Program Oxygen Prescription None      Intervention   Short Term Goals To learn and understand importance of maintaining oxygen saturations>88%;To learn and demonstrate proper pursed lip breathing techniques or other breathing techniques. ;To learn and demonstrate proper use of respiratory medications;To learn and understand importance of monitoring SPO2 with pulse oximeter and demonstrate accurate use of the pulse oximeter.    Long  Term Goals Verbalizes importance of monitoring SPO2 with pulse oximeter and return  demonstration;Maintenance of O2 saturations>88%;Exhibits proper breathing techniques, such as pursed lip breathing or other method taught during program session;Compliance with respiratory medication             Oxygen Re-Evaluation:  Oxygen Re-Evaluation     Row Name 03/26/21 0724 04/07/21 0727 05/05/21 0741 06/05/21 0817       Program Oxygen Prescription   Program Oxygen Prescription None None None None      Home Oxygen   Home Oxygen Device None None None None    Sleep Oxygen Prescription CPAP CPAP CPAP CPAP    Home Exercise Oxygen Prescription None None None None    Home Resting Oxygen Prescription None None None None    Compliance with Home Oxygen Use -- Yes Yes Yes  Goals/Expected Outcomes   Short Term Goals To learn and understand importance of maintaining oxygen saturations>88%;To learn and demonstrate proper pursed lip breathing techniques or other breathing techniques. ;To learn and understand importance of monitoring SPO2 with pulse oximeter and demonstrate accurate use of the pulse oximeter. To learn and understand importance of maintaining oxygen saturations>88%;To learn and understand importance of monitoring SPO2 with pulse oximeter and demonstrate accurate use of the pulse oximeter. To learn and understand importance of maintaining oxygen saturations>88%;To learn and understand importance of monitoring SPO2 with pulse oximeter and demonstrate accurate use of the pulse oximeter.;To learn and demonstrate proper pursed lip breathing techniques or other breathing techniques. ;To learn and demonstrate proper use of respiratory medications To learn and understand importance of maintaining oxygen saturations>88%;To learn and understand importance of monitoring SPO2 with pulse oximeter and demonstrate accurate use of the pulse oximeter.;To learn and demonstrate proper pursed lip breathing techniques or other breathing techniques. ;To learn and demonstrate proper use of respiratory  medications    Long  Term Goals Verbalizes importance of monitoring SPO2 with pulse oximeter and return demonstration;Maintenance of O2 saturations>88%;Exhibits proper breathing techniques, such as pursed lip breathing or other method taught during program session;Compliance with respiratory medication Maintenance of O2 saturations>88%;Verbalizes importance of monitoring SPO2 with pulse oximeter and return demonstration Verbalizes importance of monitoring SPO2 with pulse oximeter and return demonstration;Exhibits proper breathing techniques, such as pursed lip breathing or other method taught during program session;Maintenance of O2 saturations>88%;Compliance with respiratory medication Verbalizes importance of monitoring SPO2 with pulse oximeter and return demonstration;Exhibits proper breathing techniques, such as pursed lip breathing or other method taught during program session;Maintenance of O2 saturations>88%;Compliance with respiratory medication    Comments Reviewed PLB technique with pt.  Talked about how it works and it's importance in maintaining their exercise saturations. He has a pulse oximeter to check his oxygen saturation at home. Informed and explained why it is important to have one. Reviewed that oxygen saturations should be 88 percent and above. Patient is going to check his oxygen when at rest and on exertion. PLB reviewed with patient. He uses it often when he gets out of breath and to calm himself down. Kevin Yang is doing well in rehab.  He is compliant with his CPAP at night.  His is using his PLB to help control his breathing.  He uses his pulse oximeter to keep an eye on his saturations and they have been doing well. Kevin Yang has done well in rehab.  He continues to be compliant with CPAP.  He has gotten used to PLB and uses it more frequently.  His saturations continue to do well.    Goals/Expected Outcomes Short: Become more profiecient at using PLB.   Long: Become independent at using  PLB. Short: monitor oxygen at home with exertion. Long: maintain oxygen saturations above 88 percent independently. short: Continue to use PLB to help with breathing Long: Continued compliance Continue to manage pulmonary disease             Oxygen Discharge (Final Oxygen Re-Evaluation):  Oxygen Re-Evaluation - 06/05/21 0817       Program Oxygen Prescription   Program Oxygen Prescription None      Home Oxygen   Home Oxygen Device None    Sleep Oxygen Prescription CPAP    Home Exercise Oxygen Prescription None    Home Resting Oxygen Prescription None    Compliance with Home Oxygen Use Yes      Goals/Expected Outcomes   Short Term  Goals To learn and understand importance of maintaining oxygen saturations>88%;To learn and understand importance of monitoring SPO2 with pulse oximeter and demonstrate accurate use of the pulse oximeter.;To learn and demonstrate proper pursed lip breathing techniques or other breathing techniques. ;To learn and demonstrate proper use of respiratory medications    Long  Term Goals Verbalizes importance of monitoring SPO2 with pulse oximeter and return demonstration;Exhibits proper breathing techniques, such as pursed lip breathing or other method taught during program session;Maintenance of O2 saturations>88%;Compliance with respiratory medication    Comments Tayler has done well in rehab.  He continues to be compliant with CPAP.  He has gotten used to PLB and uses it more frequently.  His saturations continue to do well.    Goals/Expected Outcomes Continue to manage pulmonary disease             Initial Exercise Prescription:  Initial Exercise Prescription - 03/24/21 1400       Date of Initial Exercise RX and Referring Provider   Date 03/24/21    Referring Provider Marion, [place] MD      Oxygen   Maintain Oxygen Saturation 88% or higher      Treadmill   MPH 2.2    Grade 0.5    Minutes 15    METs 2.84      Recumbant Bike   Level 2    RPM 50     Watts 26    Minutes 15    METs 2.8      REL-XR   Level 2    Speed 50    Minutes 15    METs 2.8      T5 Nustep   Level 2    SPM 80    Minutes 15    METs 2.8      Prescription Details   Frequency (times per week) 3    Duration Progress to 30 minutes of continuous aerobic without signs/symptoms of physical distress      Intensity   THRR 40-80% of Max Heartrate 116-140    Ratings of Perceived Exertion 11-13    Perceived Dyspnea 0-4      Progression   Progression Continue to progress workloads to maintain intensity without signs/symptoms of physical distress.      Resistance Training   Training Prescription Yes    Weight 3 lb    Reps 10-15             Perform Capillary Blood Glucose checks as needed.  Exercise Prescription Changes:   Exercise Prescription Changes     Row Name 04/02/21 0700 04/07/21 1400 04/21/21 1500 05/05/21 1300 05/19/21 1100     Response to Exercise   Blood Pressure (Admit) -- 132/68 152/70 144/68 124/74   Blood Pressure (Exercise) -- 166/70 138/68 -- --   Blood Pressure (Exit) -- 130/72 146/70 128/72 122/76   Heart Rate (Admit) -- 93 bpm 84 bpm 78 bpm 93 bpm   Heart Rate (Exercise) -- 107 bpm 98 bpm 104 bpm 111 bpm   Heart Rate (Exit) -- 96 bpm 91 bpm 90 bpm 103 bpm   Oxygen Saturation (Admit) -- 92 % 94 % 94 % 94 %   Oxygen Saturation (Exercise) -- 88 % 93 % 93 % 93 %   Oxygen Saturation (Exit) -- 90 % 95 % 96 % 94 %   Rating of Perceived Exertion (Exercise) -- _0 Perceived Dyspnea (Exercise) -- _1 Symptoms -- SOB SOB  SOB SOB   Duration -- Progress to 30 minutes of  aerobic without signs/symptoms of physical distress Continue with 30 min of aerobic exercise without signs/symptoms of physical distress. Continue with 30 min of aerobic exercise without signs/symptoms of physical distress. Continue with 30 min of aerobic exercise without signs/symptoms of physical distress.   Intensity -- THRR unchanged THRR unchanged THRR  unchanged THRR unchanged     Progression   Progression -- Continue to progress workloads to maintain intensity without signs/symptoms of physical distress. Continue to progress workloads to maintain intensity without signs/symptoms of physical distress. Continue to progress workloads to maintain intensity without signs/symptoms of physical distress. Continue to progress workloads to maintain intensity without signs/symptoms of physical distress.   Average METs -- 2.6 3.29 3.73 3.34     Resistance Training   Training Prescription -- Yes Yes Yes Yes   Weight -- 4 lb 6 lb 6 lb 6 lb   Reps -- 10-15 10-15 10-15 10-15     Interval Training   Interval Training -- No No No No     Treadmill   MPH -- 2.2 2.5 2.5 2.7   Grade -- 0._0 1.5   Minutes -- _1 METs -- 2.84 3.26 3.26 3.63     Recumbant Bike   Level -- -- -- -- 5   Watts -- -- -- -- 39   Minutes -- -- -- -- 15   METs -- -- -- -- 2.81     NuStep   Level -- -- -- -- 4   Minutes -- -- -- -- 15   METs -- -- -- -- 1.9     REL-XR   Level -- _2 Minutes -- _3 METs -- -- 3.6 4.2 3.7     T5 Nustep   Level -- -- 4 -- 4   Minutes -- -- 15 -- 15   METs -- -- 2.6 -- 2     Home Exercise Plan   Plans to continue exercise at Home (comment)  walking,treadmill,weights Home (comment)  walking,treadmill,weights Home (comment)  walking,treadmill,weights Home (comment)  walking,treadmill,weights Home (comment)  walking,treadmill,weights   Frequency Add 2 additional days to program exercise sessions. Add 2 additional days to program exercise sessions. Add 2 additional days to program exercise sessions. Add 2 additional days to program exercise sessions. Add 2 additional days to program exercise sessions.   Initial Home Exercises Provided 04/02/21 04/02/21 04/02/21 04/02/21 04/02/21    Row Name 06/03/21 1400             Response to Exercise   Blood Pressure (Admit) 138/70       Blood Pressure (Exit) 126/70        Heart Rate (Admit) 105 bpm       Heart Rate (Exercise) 118 bpm       Heart Rate (Exit) 104 bpm       Oxygen Saturation (Admit) 94 %       Oxygen Saturation (Exercise) 87 %       Oxygen Saturation (Exit) 96 %       Rating of Perceived Exertion (Exercise) 14       Perceived Dyspnea (Exercise) 2       Symptoms SOB       Duration Continue with 30 min of aerobic exercise without signs/symptoms of physical distress.       Intensity THRR unchanged  Progression   Progression Continue to progress workloads to maintain intensity without signs/symptoms of physical distress.       Average METs 3.5         Resistance Training   Training Prescription Yes       Weight 6 lb       Reps 10-15                Exercise Comments:   Exercise Comments     Row Name 03/26/21 0723 06/18/21 0733         Exercise Comments First full day of exercise!  Patient was oriented to gym and equipment including functions, settings, policies, and procedures.  Patient's individual exercise prescription and treatment plan were reviewed.  All starting workloads were established based on the results of the 6 minute walk test done at initial orientation visit.  The plan for exercise progression was also introduced and progression will be customized based on patient's performance and goals. Philopater graduated today from  rehab with 36 sessions completed.  Details of the patient's exercise prescription and what He needs to do in order to continue the prescription and progress were discussed with patient.  Patient was given a copy of prescription and goals.  Patient verbalized understanding.  Juleon plans to continue to exercise by using the treadmill and staff videos at home.               Exercise Goals and Review:   Exercise Goals     Row Name 03/24/21 1422             Exercise Goals   Increase Physical Activity Yes       Intervention Provide advice, education, support and counseling about physical  activity/exercise needs.;Develop an individualized exercise prescription for aerobic and resistive training based on initial evaluation findings, risk stratification, comorbidities and participant's personal goals.       Expected Outcomes Short Term: Attend rehab on a regular basis to increase amount of physical activity.;Long Term: Add in home exercise to make exercise part of routine and to increase amount of physical activity.;Long Term: Exercising regularly at least 3-5 days a week.       Increase Strength and Stamina Yes       Intervention Provide advice, education, support and counseling about physical activity/exercise needs.;Develop an individualized exercise prescription for aerobic and resistive training based on initial evaluation findings, risk stratification, comorbidities and participant's personal goals.       Expected Outcomes Short Term: Increase workloads from initial exercise prescription for resistance, speed, and METs.;Short Term: Perform resistance training exercises routinely during rehab and add in resistance training at home;Long Term: Improve cardiorespiratory fitness, muscular endurance and strength as measured by increased METs and functional capacity (6MWT)       Able to understand and use rate of perceived exertion (RPE) scale Yes       Intervention Provide education and explanation on how to use RPE scale       Expected Outcomes Short Term: Able to use RPE daily in rehab to express subjective intensity level;Long Term:  Able to use RPE to guide intensity level when exercising independently       Able to understand and use Dyspnea scale Yes       Intervention Provide education and explanation on how to use Dyspnea scale       Expected Outcomes Short Term: Able to use Dyspnea scale daily in rehab to express subjective sense of shortness of breath  during exertion;Long Term: Able to use Dyspnea scale to guide intensity level when exercising independently       Knowledge and  understanding of Target Heart Rate Range (THRR) Yes       Intervention Provide education and explanation of THRR including how the numbers were predicted and where they are located for reference       Expected Outcomes Short Term: Able to state/look up THRR;Short Term: Able to use daily as guideline for intensity in rehab;Long Term: Able to use THRR to govern intensity when exercising independently       Able to check pulse independently Yes       Intervention Provide education and demonstration on how to check pulse in carotid and radial arteries.;Review the importance of being able to check your own pulse for safety during independent exercise       Expected Outcomes Short Term: Able to explain why pulse checking is important during independent exercise;Long Term: Able to check pulse independently and accurately       Understanding of Exercise Prescription Yes       Intervention Provide education, explanation, and written materials on patient's individual exercise prescription       Expected Outcomes Short Term: Able to explain program exercise prescription;Long Term: Able to explain home exercise prescription to exercise independently                Exercise Goals Re-Evaluation :  Exercise Goals Re-Evaluation     Row Name 03/26/21 0723 04/02/21 0744 04/07/21 1417 04/21/21 1513 05/05/21 0730     Exercise Goal Re-Evaluation   Exercise Goals Review Increase Physical Activity;Able to understand and use rate of perceived exertion (RPE) scale;Knowledge and understanding of Target Heart Rate Range (THRR);Understanding of Exercise Prescription;Increase Strength and Stamina;Able to understand and use Dyspnea scale;Able to check pulse independently Increase Physical Activity;Able to understand and use rate of perceived exertion (RPE) scale;Knowledge and understanding of Target Heart Rate Range (THRR);Understanding of Exercise Prescription;Increase Strength and Stamina;Able to understand and use  Dyspnea scale;Able to check pulse independently Increase Physical Activity;Increase Strength and Stamina Increase Physical Activity;Increase Strength and Stamina;Understanding of Exercise Prescription Increase Physical Activity;Increase Strength and Stamina;Understanding of Exercise Prescription   Comments Reviewed RPE and dyspnea scales, THR and program prescription with pt today.  Pt voiced understanding and was given a copy of goals to take home. Reviewed home exercise with pt today.  Pt plans to walk and use treadmill at home for exercise.  He also has a total gym and pedal machine that he can use.  Reviewed THR, pulse, RPE, sign and symptoms, pulse oximetery and when to call 911 or MD.  Also discussed weather considerations and indoor options.  Pt voiced understanding. Dequante is tolerating exercise well and has increased to 4 lb for strength work.  He is at level 5 on the [place] is doing well in rehab.  His RPE on the XR has dropped to 10 so he should be ready to move up again.  We will continue to monitor his progress. Aneudy is doing well in rehab.  He asked to increase workloads today and we will start to work his way up.  He is no longer feeling as short of breath at home and able to do more things.  He is walking at home and doing some yard work with the leaves.   Expected Outcomes Short: Use RPE daily to regulate intensity. Long: Follow program prescription in THR. Short: Start to add in at  least one extra day walking at home Long: Continue to exercise independently Short: attend consistently Long: continue to build stamina Short: Try increasing workloads Long: Continue to improve stamina. Short: Continue to bump up workloads Long: Continue to improve stamina.    [place] Name 05/05/21 1333 05/05/21 1335 05/19/21 1124 06/03/21 1430 06/03/21 1439     Exercise Goal Re-Evaluation   Exercise Goals Review Increase Physical Activity;Increase Strength and Stamina -- Increase Physical Activity;Increase  Strength and Stamina Increase Physical Activity;Increase Strength and Stamina --   Comments Kevin Yang attends consistently.  He does not reach THR range.  Staff wil encourage him to continue to increase levels to reach HR range. Kevin Yang is doing well at rehab. He has increased to 2.7 speed and 1.5% incline on the treadmill as well as level 5 on the recumbant bike. Oxygen saturations are staying above 88%. Will continue to monitor. -- Kevin Yang attends consistently and reaches THR range most sessions.  Oxygen stays above 90% consistently.  We will continue to monitor progress.   Expected Outcomes -- Short: work in [employer] range Long: increase average MET level Short: Increase watts on recumbant bike Long: Continue to increase overall strength and stamina -- Short: continue to reach HR range Long: improve overall MET level    Row Name 06/05/21 0756             Exercise Goal Re-Evaluation   Exercise Goals Review Increase Physical Activity;Increase Strength and Stamina;Understanding of Exercise Prescription       Comments Kevin Yang improved his post walk by 188 ft!  He has four more sessions.  He is already set up to go to [facility] after graduation.  His wife will be starting our program soon and he will go to [facility] while she is in class.  He feels that he has gained strength and stamina since being in the program.       Expected Outcomes Short: graduate Long: conitnue to exercise independently                Discharge Exercise Prescription (Final Exercise Prescription Changes):  Exercise Prescription Changes - 06/03/21 1400       Response to Exercise   Blood Pressure (Admit) 138/70    Blood Pressure (Exit) 126/70    Heart Rate (Admit) 105 bpm    Heart Rate (Exercise) 118 bpm    Heart Rate (Exit) 104 bpm    Oxygen Saturation (Admit) 94 %    Oxygen Saturation (Exercise) 87 %    Oxygen Saturation (Exit) 96 %    Rating of Perceived Exertion (Exercise) 14    Perceived Dyspnea (Exercise) 2     Symptoms SOB    Duration Continue with 30 min of aerobic exercise without signs/symptoms of physical distress.    Intensity THRR unchanged      Progression   Progression Continue to progress workloads to maintain intensity without signs/symptoms of physical distress.    Average METs 3.5      Resistance Training   Training Prescription Yes    Weight 6 lb    Reps 10-15             Nutrition:  Target Goals: Understanding of nutrition guidelines, daily intake of sodium <1569m, cholesterol <2019m calories 30% from fat and 7% or less from saturated fats, daily to have 5 or more servings of fruits and vegetables.  Education: All About Nutrition: -Group instruction provided by verbal, written material, interactive activities, discussions, models, and posters to present general guidelines for  heart healthy nutrition including fat, fiber, MyPlate, the role of sodium in heart healthy nutrition, utilization of the nutrition label, and utilization of this knowledge for meal planning. Follow up email sent as well. Written material given at graduation. Flowsheet Row Pulmonary Rehab from 05/29/2021 in [facility] Cardiac and Pulmonary Rehab  Date 04/17/21  Educator [facility]  Instruction Review Code 1- Verbalizes Understanding       Biometrics:  Pre Biometrics - 03/24/21 1422       Pre Biometrics   Height 5' 9.5" (1.765 m)    Weight 219 lb 3.2 oz (99.4 kg)    BMI (Calculated) 31.92    Single Leg Stand 30 seconds             Post Biometrics - 06/05/21 0756        Post  Biometrics   Height 5' 9.5" (1.765 m)    Weight 224 lb 8 oz (101.8 kg)    BMI (Calculated) 32.69    Single Leg Stand 11.2 seconds             Nutrition Therapy Plan and Nutrition Goals:  Nutrition Therapy & Goals - 04/09/21 1014       Nutrition Therapy   Diet Heart healthy, low Na, pulmonary MNT    Drug/Food Interactions Statins/Certain Fruits    Protein (specify units) 80g    Fiber 30 grams    Whole Grain  Foods 3 servings    Saturated Fats 12 max. grams    Fruits and Vegetables 8 servings/day    Sodium 1.5 grams      Personal Nutrition Goals   Nutrition Goal ST: limit fried foods/high fat meat to <1-2x/week LT: limit Na < 1.5g/day, limit saturated fat < 12g/day, 1/2 grains consumed as whole grains    Comments Patient is a 71 y.o. M admitted to rehab for primary dx of dyspnea on exertion. PMH of HCL, GERD, Asthma, COPD, ulcerative colitis. Relevant medications includes lipitor, zocor, prednisone, imodium. PYP: 42. He would like to cut back on greasy foods and include more baked food, he would like to eat more fish. B: McD[employer] coffee with sausage egg and cheese or buttermilk biscuit, cereal (lactaid and frosted flakes and honey nut cheerios) L: same as dinner D: He likes to grill and use airfyer. He reports eating a lot of baked chicken at restaurants, but will get fried chicken at fast food. Every now and again will go out to get hotdogs. They enjoy broccoli, collards, lima beans, carrots, onions. Will do baked and sweet potatoes especially when going out to eat. Grains: white rice, mac and cheese. He will use mostly olive oil whencooking and some butter. He will salt food after eating, but not much. He will go out to eat 4x/week for lunch or dinner. Drinks: water, tea, soda (1), OJ. He has stable weight. Sometimes when his UC flaires up he will lose weight from diahhrea - he was steady for 12 years, but in the last 3 years they have been switching around his medicaitons. He will stay away from spicy foods. Discussed heart healthy eating and pulmonary MNT.      Intervention Plan   Intervention Prescribe, educate and counsel regarding individualized specific dietary modifications aiming towards targeted core components such as weight, hypertension, lipid management, diabetes, heart failure and other comorbidities.    Expected Outcomes Short Term Goal: Understand basic principles of dietary content, such as  calories, fat, sodium, cholesterol and nutrients.;Long Term Goal: Adherence to prescribed nutrition plan.;Short  Term Goal: A plan has been developed with personal nutrition goals set during dietitian appointment.             Nutrition Assessments:  MEDIFICTS Score Key: ?70 Need to make dietary changes  40-70 Heart Healthy Diet ? 40 Therapeutic Level Cholesterol Diet  Flowsheet Row Pulmonary Rehab from 06/12/2021 in [facility] Cardiac and Pulmonary Rehab  Picture Your Plate Total Score on Admission 42  Picture Your Plate Total Score on Discharge 46      Picture Your Plate Scores: <96 Unhealthy dietary pattern with much room for improvement. 41-50 Dietary pattern unlikely to meet recommendations for good health and room for improvement. 51-60 More healthful dietary pattern, with some room for improvement.  >60 Healthy dietary pattern, although there may be some specific behaviors that could be improved.   Nutrition Goals Re-Evaluation:  Nutrition Goals Re-Evaluation     [place] Name 05/05/21 0735 06/05/21 0810           Goals   Nutrition Goal ST: limit fried foods/high fat meat to <1-2x/week LT: limit Na < 1.5g/day, limit saturated fat < 12g/day, 1/2 grains consumed as whole grains Short: Enjoy holiday foods in moderation Long: Conitnue to watch fatty foods and salt intake      Comment Kevin Yang is doing okay with his diet.  He is a little more aware of what he is eating.  He is trying to be more mindful with his eating but still enjoy holiday eating as it's only once a year.  He is cutting back on his fried foods and fatty meats.  He is also trying to watch his sodium more.  He is aiming for moderation this season. Kevin Yang was able to enjoy his holiday foods and all of his wife's family Christmas parties and treats.  However, he admits to not using his moderation.  He has gained weight over the holidays, but now has renewed his dedication to his diet.  He says he just couldn't resist all the  goodies in the house.  He is back to watching what he is eating and aiming for a good variety of healthy food.      Expected Outcome Short: Enjoy holiday foods in moderation Long: Conitnue to watch fatty foods and salt intake Get back to healthy diet with a wide vareity               Nutrition Goals Discharge (Final Nutrition Goals Re-Evaluation):  Nutrition Goals Re-Evaluation - 06/05/21 0810       Goals   Nutrition Goal Short: Enjoy holiday foods in moderation Long: Conitnue to watch fatty foods and salt intake    Comment Kevin Yang was able to enjoy his holiday foods and all of his wife's family Christmas parties and treats.  However, he admits to not using his moderation.  He has gained weight over the holidays, but now has renewed his dedication to his diet.  He says he just couldn't resist all the goodies in the house.  He is back to watching what he is eating and aiming for a good variety of healthy food.    Expected Outcome Get back to healthy diet with a wide vareity             Psychosocial: Target Goals: Acknowledge presence or absence of significant depression and/or stress, maximize coping skills, provide positive support system. Participant is able to verbalize types and ability to use techniques and skills needed for reducing stress and depression.   Education: Stress, Anxiety,  and Depression - Group verbal and visual presentation to define topics covered.  Reviews how body is impacted by stress, anxiety, and depression.  Also discusses healthy ways to reduce stress and to treat/manage anxiety and depression.  Written material given at graduation. Flowsheet Row Pulmonary Rehab from 05/29/2021 in [facility] Cardiac and Pulmonary Rehab  Date 05/14/21  Educator [facility]  Instruction Review Code 1- Verbalizes Understanding       Education: Sleep Hygiene -Provides group verbal and written instruction about how sleep can affect your health.  Define sleep hygiene, discuss sleep cycles  and impact of sleep habits. Review good sleep hygiene tips.    Initial Review & Psychosocial Screening:  Initial Psych Review & Screening - 03/19/21 1041       Initial Review   Current issues with Current Sleep Concerns      Family Dynamics   Good Support System? Yes   church family, friends     Barriers   Psychosocial barriers to participate in program There are no identifiable barriers or psychosocial needs.;The patient should benefit from training in stress management and relaxation.      Screening Interventions   Interventions Encouraged to exercise;Provide feedback about the scores to participant;To provide support and resources with identified psychosocial needs    Expected Outcomes Short Term goal: Utilizing psychosocial counselor, staff and physician to assist with identification of specific Stressors or current issues interfering with healing process. Setting desired goal for each stressor or current issue identified.;Long Term Goal: Stressors or current issues are controlled or eliminated.;Short Term goal: Identification and review with participant of any Quality of Life or Depression concerns found by scoring the questionnaire.;Long Term goal: The participant improves quality of Life and PHQ9 Scores as seen by post scores and/or verbalization of changes             Quality of Life Scores:  Scores of 19 and below usually indicate a poorer quality of life in these areas.  A difference of  2-3 points is a clinically meaningful difference.  A difference of 2-3 points in the total score of the Quality of Life Index has been associated with significant improvement in overall quality of life, self-image, physical symptoms, and general health in studies assessing change in quality of life.  PHQ-9: Recent Review Flowsheet Data     Depression screen [facility] 2/9 06/12/2021 05/05/2021 04/07/2021 03/24/2021 02/14/2018   Decreased Interest 0 _0 0   Down, Depressed, Hopeless 0 0 1 2 0    PHQ - 2 Score 0 _1 0   Altered sleeping 2 0  _2 Tired, decreased energy _3 Change in appetite 1 0 0 1 0   Feeling bad or failure about yourself  0 0 0 0 0   Trouble concentrating 0 0 0 1 0   Moving slowly or fidgety/restless 0 0 0 0 0   Suicidal thoughts 0 0 0 0 0   PHQ-9 Score _4 Difficult doing work/chores Somewhat difficult Somewhat difficult Somewhat difficult Very difficult Not difficult at all      Interpretation of Total Score  Total Score Depression Severity:  1-4 = Minimal depression, 5-9 = Mild depression, 10-14 = Moderate depression, 15-19 = Moderately severe depression, 20-27 = Severe depression   Psychosocial Evaluation and Intervention:  Psychosocial Evaluation - 03/19/21 1042       Psychosocial Evaluation & Interventions   Interventions  Encouraged to exercise with the program and follow exercise prescription    Comments Kevin Yang is coming to Pulmonary Rehab after worsening dyspnea. He has done the program before at [facility]. He is very motivated to work hard and tries not to let his health deter him. He relies on his church family and friends. He does report issues sleeping where he only gets about 4 hours of sleep before his back starts to bother him. He usually will get up and nap later during the day. Recently his breathing has gotten worse and his diverticulitis has come back. He is on medication to help with both and is hoping he can repair the hit his stamina has taken.    Expected Outcomes Short: attend pulmonary rehab for education and exercise. Long: develop and maintain positive self care habtis.    Continue Psychosocial Services  Follow up required by staff             Psychosocial Re-Evaluation:  Psychosocial Re-Evaluation     [place] Name 04/07/21 0734 05/05/21 0733 06/05/21 0759         Psychosocial Re-Evaluation   Current issues with Current Stress Concerns Current Stress Concerns;Current Depression Current Stress  Concerns;Current Sleep Concerns     Comments Reviewed patient health questionnaire (PHQ-9) with patient for follow up. Previously, patients score indicated signs/symptoms of depression.  Reviewed to see if patient is improving symptom wise while in program.  Score improved and patient states that it is because he has been able to get out and exercise to improve his breathing. Kevin Yang is doing well in rehab.  His PHQ is down to a 3.  He is feeling better in general and no longer as scared with his breathing as when he started the program.  His dog keeps him up a night barking at everything, but he usually sleeps well otherwise.  He denies any other major stressors.  They are trying to get his wife into a pulmonary program to help her too. Kevin Yang has done well in rehab.  He is now in a good place mentally and his breathing has improved. His biggest stressor now is his wife's health but he is excited that she will be coming to rehab. He feels that after doing rehab he has a greater sense of peace of mind when it comes to his health and breathing.   He enjoyed the holidays, but did gain weight which he find frustrating.  He still struggles with his sleep as his dog continues to wake him during the night.  However, he is now taking naps in the afternoon to help.     Expected Outcomes Short: Continue to attend [facility] regularly for regular exercise and social engagement. Long: Continue to improve symptoms and manage a positive mental state. Short: Continue to exercise for mental boost Long: Conitnue to help dog sleep too short: continue to exercise after graduation Long: Continue to work on sleep     Interventions -- Encouraged to attend Pulmonary Rehabilitation for the exercise;Stress management education;Relaxation education Encouraged to attend Pulmonary Rehabilitation for the exercise     Continue Psychosocial Services  Follow up required by staff Follow up required by staff Follow up required by staff               Psychosocial Discharge (Final Psychosocial Re-Evaluation):  Psychosocial Re-Evaluation - 06/05/21 0759       Psychosocial Re-Evaluation   Current issues with Current Stress Concerns;Current Sleep Concerns    Comments Kevin Yang has  done well in rehab.  He is now in a good place mentally and his breathing has improved. His biggest stressor now is his wife's health but he is excited that she will be coming to rehab. He feels that after doing rehab he has a greater sense of peace of mind when it comes to his health and breathing.   He enjoyed the holidays, but did gain weight which he find frustrating.  He still struggles with his sleep as his dog continues to wake him during the night.  However, he is now taking naps in the afternoon to help.    Expected Outcomes short: continue to exercise after graduation Long: Continue to work on sleep    Interventions Encouraged to attend Pulmonary Rehabilitation for the exercise    Continue Psychosocial Services  Follow up required by staff             Education: Education Goals: Education classes will be provided on a weekly basis, covering required topics. Participant will state understanding/return demonstration of topics presented.  Learning Barriers/Preferences:  Learning Barriers/Preferences - 03/19/21 1041       Learning Barriers/Preferences   Learning Barriers None    Learning Preferences Skilled Demonstration;Written Material             General Pulmonary Education Topics:  Infection Prevention: - Provides verbal and written material to individual with discussion of infection control including proper hand washing and proper equipment cleaning during exercise session. Flowsheet Row Pulmonary Rehab from 05/29/2021 in [facility] Cardiac and Pulmonary Rehab  Date 03/24/21  Educator [facility]  Instruction Review Code 1- Verbalizes Understanding       Falls Prevention: - Provides verbal and written material to individual with discussion  of falls prevention and safety. Flowsheet Row Pulmonary Rehab from 05/29/2021 in [facility] Cardiac and Pulmonary Rehab  Date 03/24/21  Educator [facility]  Instruction Review Code 1- Verbalizes Understanding       Chronic Lung Disease Review: - Group verbal instruction with posters, models, PowerPoint presentations and videos,  to review new updates, new respiratory medications, new advancements in procedures and treatments. Providing information on websites and "800" numbers for continued self-education. Includes information about supplement oxygen, available portable oxygen systems, continuous and intermittent flow rates, oxygen safety, concentrators, and Medicare reimbursement for oxygen. Explanation of Pulmonary Drugs, including class, frequency, complications, importance of spacers, rinsing mouth after steroid MDI's, and proper cleaning methods for nebulizers. Review of basic lung anatomy and physiology related to function, structure, and complications of lung disease. Review of risk factors. Discussion about methods for diagnosing sleep apnea and types of masks and machines for OSA. Includes a review of the use of types of environmental controls: home humidity, furnaces, filters, dust mite/pet prevention, HEPA vacuums. Discussion about weather changes, air quality and the benefits of nasal washing. Instruction on Warning signs, infection symptoms, calling MD promptly, preventive modes, and value of vaccinations. Review of effective airway clearance, coughing and/or vibration techniques. Emphasizing that all should Create an Action Plan. Written material given at graduation. Flowsheet Row Pulmonary Rehab from 05/29/2021 in [facility] Cardiac and Pulmonary Rehab  Education need identified 03/24/21  Date 05/07/21  Educator [facility]  Instruction Review Code 1- Verbalizes Understanding       AED/CPR: - Group verbal and written instruction with the use of models to demonstrate the basic use of the AED with the basic  ABC's of resuscitation.    Anatomy and Cardiac Procedures: - Group verbal and visual presentation and models provide information about  basic cardiac anatomy and function. Reviews the testing methods done to diagnose heart disease and the outcomes of the test results. Describes the treatment choices: Medical Management, Angioplasty, or Coronary Bypass Surgery for treating various heart conditions including Myocardial Infarction, Angina, Valve Disease, and Cardiac Arrhythmias.  Written material given at graduation. Flowsheet Row Pulmonary Rehab from 05/29/2021 in [facility] Cardiac and Pulmonary Rehab  Date 04/02/21  Educator SB  Instruction Review Code 1- Verbalizes Understanding       Medication Safety: - Group verbal and visual instruction to review commonly prescribed medications for heart and lung disease. Reviews the medication, class of the drug, and side effects. Includes the steps to properly store meds and maintain the prescription regimen.  Written material given at graduation. Flowsheet Row Pulmonary Rehab from 05/29/2021 in [facility] Cardiac and Pulmonary Rehab  Date 04/22/21  Educator SB  Instruction Review Code 1- Verbalizes Understanding       Other: -Provides group and verbal instruction on various topics (see comments) Flowsheet Row PULMONARY REHAB CHRONIC OBSTRUCTIVE PULMONARY DISEASE from 12/09/2017 in [place]  Date 11/25/17  Educator [place]  Instruction Review Code 1- Verbalizes Understanding       Knowledge Questionnaire Score:  Knowledge Questionnaire Score - 06/12/21 0801       Knowledge Questionnaire Score   Pre Score 15/18    Post Score 16/18              Core Components/Risk Factors/Patient Goals at Admission:  Personal Goals and Risk Factors at Admission - 03/24/21 1423       Core Components/Risk Factors/Patient Goals on Admission    Weight Management Yes;Weight Loss;Obesity     Intervention Weight Management: Develop a combined nutrition and exercise program designed to reach desired caloric intake, while maintaining appropriate intake of nutrient and fiber, sodium and fats, and appropriate energy expenditure required for the weight goal.;Weight Management: Provide education and appropriate resources to help participant work on and attain dietary goals.;Weight Management/Obesity: Establish reasonable short term and long term weight goals.;Obesity: Provide education and appropriate resources to help participant work on and attain dietary goals.    Admit Weight 219 lb 3.2 oz (99.4 kg)    Goal Weight: Short Term 214 lb (97.1 kg)    Goal Weight: Long Term 209 lb (94.8 kg)    Expected Outcomes Short Term: Continue to assess and modify interventions until short term weight is achieved;Long Term: Adherence to nutrition and physical activity/exercise program aimed toward attainment of established weight goal;Weight Maintenance: Understanding of the daily nutrition guidelines, which includes 25-35% calories from fat, 7% or less cal from saturated fats, less than 246m cholesterol, less than 1.5gm of sodium, & 5 or more servings of fruits and vegetables daily;Understanding recommendations for meals to include 15-35% energy as protein, 25-35% energy from fat, 35-60% energy from carbohydrates, less than 2019mof dietary cholesterol, 20-35 gm of total fiber daily;Understanding of distribution of calorie intake throughout the day with the consumption of 4-5 meals/snacks    Improve shortness of breath with ADL's Yes    Intervention Provide education, individualized exercise plan and daily activity instruction to help decrease symptoms of SOB with activities of daily living.    Expected Outcomes Short Term: Improve cardiorespiratory fitness to achieve a reduction of symptoms when performing ADLs;Long Term: Be able to perform more ADLs without symptoms or delay the onset of symptoms    Increase  knowledge of respiratory medications and ability  to use respiratory devices properly  Yes    Intervention Provide education and demonstration as needed of appropriate use of medications, inhalers, and oxygen therapy.    Expected Outcomes Short Term: Achieves understanding of medications use. Understands that oxygen is a medication prescribed by physician. Demonstrates appropriate use of inhaler and oxygen therapy.;Long Term: Maintain appropriate use of medications, inhalers, and oxygen therapy.             Education:Diabetes - Individual verbal and written instruction to review signs/symptoms of diabetes, desired ranges of glucose level fasting, after meals and with exercise. Acknowledge that pre and post exercise glucose checks will be done for 3 sessions at entry of program.   Know Your Numbers and Heart Failure: - Group verbal and visual instruction to discuss disease risk factors for cardiac and pulmonary disease and treatment options.  Reviews associated critical values for Overweight/Obesity, Hypertension, Cholesterol, and Diabetes.  Discusses basics of heart failure: signs/symptoms and treatments.  Introduces Heart Failure Zone chart for action plan for heart failure.  Written material given at graduation. Flowsheet Row Pulmonary Rehab from 05/29/2021 in [facility] Cardiac and Pulmonary Rehab  Date 04/30/21  Educator SB  Instruction Review Code 1- Verbalizes Understanding       Core Components/Risk Factors/Patient Goals Review:   Goals and Risk Factor Review     Row Name 04/07/21 0730 05/05/21 0737 06/05/21 0813         Core Components/Risk Factors/Patient Goals Review   Personal Goals Review Improve shortness of breath with ADL's Improve shortness of breath with ADL's;Weight Management/Obesity;Lipids;Increase knowledge of respiratory medications and ability to use respiratory devices properly. Improve shortness of breath with ADL's;Weight Management/Obesity;Lipids;Increase knowledge  of respiratory medications and ability to use respiratory devices properly.     Review Spoke to patient about their shortness of breath and what they can do to improve. Patient has been informed of breathing techniques when starting the program. Patient is informed to tell staff if they have had any med changes and that certain meds they are taking or not taking can be causing shortness of breath. Kevin Yang is doing well in rehab. His weight is holding steady.  He is watching his diet some. His breathing has gotten better around the house and he is able to do more around the house without getting as short of breath.  He continues to feel better with his exercise and working on weight loss.  He is doing well with his inhalers and feeling good with medications overall. Kevin Yang has done well in rehab.  His weight has gone up over the holidays with all the goodies, but he has recommitted to his diet.  He is breathing much better now than when he started.  After his post 6MWT, he said there was no way he could have done that when he started the program.  He is doing well with his inhalers. His only medication issue is the new iron supplement he is on is giving him some stomach problems on days he takes it.  He is coping the best he can.     Expected Outcomes Short: Attend [facility] regularly to improve shortness of breath with ADLs. Long: maintain independence with ADLs Short: Continue to work on weight loss LOng: Continue to exercise to improve breathing. Short: Continue to work on side effects of iron supplement Long: Continue to monitor risk factors              Core Components/Risk Factors/Patient Goals at Discharge (Final Review):  Goals and Risk Factor Review - 06/05/21 0813       Core Components/Risk Factors/Patient Goals Review   Personal Goals Review Improve shortness of breath with ADL's;Weight Management/Obesity;Lipids;Increase knowledge of respiratory medications and ability to use respiratory  devices properly.    Review Kevin Yang has done well in rehab.  His weight has gone up over the holidays with all the goodies, but he has recommitted to his diet.  He is breathing much better now than when he started.  After his post 6MWT, he said there was no way he could have done that when he started the program.  He is doing well with his inhalers. His only medication issue is the new iron supplement he is on is giving him some stomach problems on days he takes it.  He is coping the best he can.    Expected Outcomes Short: Continue to work on side effects of iron supplement Long: Continue to monitor risk factors             ITP Comments:  ITP Comments     Row Name 03/19/21 1051 03/24/21 1416 03/26/21 0722 04/02/21 0811 04/09/21 0954   ITP Comments Initial telephone orientation completed. Diagnosis can be found in CE 8/15. EP orientation scheduled for Monday 10/31 at 8am. Completed 6MWT and gym orientation. Initial ITP created and sent for review to Dr. Zetta Bills, Medical Director. First full day of exercise!  Patient was oriented to gym and equipment including functions, settings, policies, and procedures.  Patient's individual exercise prescription and treatment plan were reviewed.  All starting workloads were established based on the results of the 6 minute walk test done at initial orientation visit.  The plan for exercise progression was also introduced and progression will be customized based on patient's performance and goals. 30 Day review completed. Medical Director ITP review done, changes made as directed, and signed approval by Medical Director.   New to program Completed initial RD consultation    [place] Name 04/30/21 0749 05/28/21 1116 06/18/21 0733       ITP Comments 30 Day review completed. Medical Director ITP review done, changes made as directed, and signed approval by Medical Director. 30 Day review completed. Medical Director ITP review done, changes made as directed, and  signed approval by Medical Director. Kevin Yang graduated today from  rehab with 36 sessions completed.  Details of the patient's exercise prescription and what He needs to do in order to continue the prescription and progress were discussed with patient.  Patient was given a copy of prescription and goals.  Patient verbalized understanding.  Kevin Yang plans to continue to exercise by using the treadmill and staff videos at home.              Comments: discharge ITP

## 2021-06-18 NOTE — Progress Notes (Signed)
Daily Session Note  Patient Details  Name: Kevin Yang MRN: [id number] Date of Birth: 1951-01-01 Referring Provider:   Flowsheet Row Pulmonary Rehab from 03/24/2021 in [facility] Cardiac and Pulmonary Rehab  Referring Provider Marion, [place] MD       Encounter Date: 06/18/2021  Check In:  Session Check In - 06/18/21 0730       Check-In   Supervising physician immediately available to respond to emergencies See telemetry face sheet for immediately available ER MD    Location [facility]-Cardiac & Pulmonary Rehab    Staff Present Birdie Sons, MPA, Elveria Rising, BA, ACSM CEP, Exercise Physiologist;Joseph Tessie Fass, [place]    Virtual Visit No    Medication changes reported     No    Fall or balance concerns reported    No    Tobacco Cessation No Change    Warm-up and Cool-down Performed on first and last piece of equipment    Resistance Training Performed Yes    VAD Patient? No    PAD/SET Patient? No      Pain Assessment   Currently in Pain? No/denies                Social History   Tobacco Use  Smoking Status Former   Types: Cigars   Quit date: 09/07/1986   Years since quitting: 34.8  Smokeless Tobacco Never    Goals Met:  Independence with exercise equipment Exercise tolerated well No report of concerns or symptoms today Strength training completed today  Goals Unmet:  Not Applicable  Comments:  [name] graduated today from  rehab with 36 sessions completed.  Details of the patient's exercise prescription and what He needs to do in order to continue the prescription and progress were discussed with patient.  Patient was given a copy of prescription and goals.  Patient verbalized understanding.  [name] plans to continue to exercise by using the treadmill and staff videos at home.    Dr. Emily Filbert is Medical Director for [place].  Dr. Ottie Glazier is Medical Director for [facility] Pulmonary Rehabilitation.

## 2021-06-18 NOTE — Progress Notes (Signed)
Discharge Progress Report  Patient Details  Name: Kevin Yang MRN: [id number] Date of Birth: [birth year] Referring Provider:   Flowsheet Row Pulmonary Rehab from 03/24/2021 in [facility] Cardiac and Pulmonary Rehab  Referring Provider Marion, [place] MD        Number of Visits: 36    Reason for Discharge:  Patient reached a stable level of exercise. Patient independent in their exercise. Patient has met program and personal goals.  Smoking History:  Social History   Tobacco Use  Smoking Status Former   Types: Cigars   Quit date: 09/07/1986   Years since quitting: 34.8  Smokeless Tobacco Never    Diagnosis:  Dyspnea on exertion  ADL UCSD:  Pulmonary Assessment Scores     Row Name 03/24/21 1424 06/12/21 0805       ADL UCSD   ADL Phase Entry Exit    SOB Score total 90 57    Rest 2 1    Walk 4 2    Stairs 5 4    Bath 3 1    Dress 4 1    Shop 3 2      CAT Score   CAT Score 31 13      mMRC Score   mMRC Score 1 1             Initial Exercise Prescription:  Initial Exercise Prescription - 03/24/21 1400       Date of Initial Exercise RX and Referring Provider   Date 03/24/21    Referring Provider Marion, [place] MD      Oxygen   Maintain Oxygen Saturation 88% or higher      Treadmill   MPH 2.2    Grade 0.5    Minutes 15    METs 2.84      Recumbant Bike   Level 2    RPM 50    Watts 26    Minutes 15    METs 2.8      REL-XR   Level 2    Speed 50    Minutes 15    METs 2.8      T5 Nustep   Level 2    SPM 80    Minutes 15    METs 2.8      Prescription Details   Frequency (times per week) 3    Duration Progress to 30 minutes of continuous aerobic without signs/symptoms of physical distress      Intensity   THRR 40-80% of Max Heartrate 116-140    Ratings of Perceived Exertion 11-13    Perceived Dyspnea 0-4      Progression   Progression Continue to progress workloads to maintain intensity without signs/symptoms of physical distress.       Resistance Training   Training Prescription Yes    Weight 3 lb    Reps 10-15             Discharge Exercise Prescription (Final Exercise Prescription Changes):  Exercise Prescription Changes - 06/03/21 1400       Response to Exercise   Blood Pressure (Admit) 138/70    Blood Pressure (Exit) 126/70    Heart Rate (Admit) 105 bpm    Heart Rate (Exercise) 118 bpm    Heart Rate (Exit) 104 bpm    Oxygen Saturation (Admit) 94 %    Oxygen Saturation (Exercise) 87 %    Oxygen Saturation (Exit) 96 %    Rating of Perceived Exertion (Exercise) 14    Perceived Dyspnea (  Exercise) 2    Symptoms SOB    Duration Continue with 30 min of aerobic exercise without signs/symptoms of physical distress.    Intensity THRR unchanged      Progression   Progression Continue to progress workloads to maintain intensity without signs/symptoms of physical distress.    Average METs 3.5      Resistance Training   Training Prescription Yes    Weight 6 lb    Reps 10-15             Functional Capacity:  6 Minute Walk     Row Name 03/24/21 1416 06/05/21 0755       6 Minute Walk   Phase Initial Discharge    Distance 1282 feet 1470 feet    Distance % Change -- 14.7 %    Distance Feet Change -- 188 ft    Walk Time 6 minutes 6 minutes    # of Rest Breaks 0 0    MPH 2.43 2.78    METS 2.84 3.53    RPE 16 13    Perceived Dyspnea  3 2    VO2 Peak 9.96 12.35    Symptoms Yes (comment) No    Comments SOB SOB    Resting HR 92 bpm 87 bpm    Resting BP 134/66 146/84    Resting Oxygen Saturation  92 % 97 %    Exercise Oxygen Saturation  during 6 min walk 84 % 85 %    Max Ex. HR 105 bpm 111 bpm    Max Ex. BP 138/74 186/84    2 Minute Post BP 138/70 154/74      Interval HR   1 Minute HR 104 94    2 Minute HR 103 104    3 Minute HR 97 105    4 Minute HR 105 106    5 Minute HR 89 111    6 Minute HR 90 108    2 Minute Post HR 98 83    Interval Heart Rate? Yes Yes      Interval Oxygen    Interval Oxygen? Yes Yes    Baseline Oxygen Saturation % 92 % 97 %    1 Minute Oxygen Saturation % 85 % 88 %    1 Minute Liters of Oxygen 0 L  Room Air 0 L  Room Air    2 Minute Oxygen Saturation % 84 % 88 %    2 Minute Liters of Oxygen 0 L 0 L    3 Minute Oxygen Saturation % 85 % 86 %    3 Minute Liters of Oxygen 0 L 0 L    4 Minute Oxygen Saturation % 84 % 86 %    4 Minute Liters of Oxygen 0 L 0 L    5 Minute Oxygen Saturation % 85 % 85 %    5 Minute Liters of Oxygen 0 L 0 L    6 Minute Oxygen Saturation % 87 % 85 %    6 Minute Liters of Oxygen 0 L 0 L    2 Minute Post Oxygen Saturation % 94 % 94 %    2 Minute Post Liters of Oxygen 0 L 0 L             Psychological, QOL, Others - Outcomes: PHQ 2/9: Depression screen [facility] 2/9 06/12/2021 05/05/2021 04/07/2021 03/24/2021 02/14/2018  Decreased Interest 0 _0 0  Down, Depressed, Hopeless 0 0 1 2 0  PHQ -  2 Score 0 _0 0  Altered sleeping 2 0 _1 Tired, decreased energy _2 Change in appetite 1 0 0 1 0  Feeling bad or failure about yourself  0 0 0 0 0  Trouble concentrating 0 0 0 1 0  Moving slowly or fidgety/restless 0 0 0 0 0  Suicidal thoughts 0 0 0 0 0  PHQ-9 Score _3 Difficult doing work/chores Somewhat difficult Somewhat difficult Somewhat difficult Very difficult Not difficult at all    Quality of Life:      Nutrition & Weight - Outcomes:  Pre Biometrics - 03/24/21 1422       Pre Biometrics   Height 5' 9.5" (1.765 m)    Weight 219 lb 3.2 oz (99.4 kg)    BMI (Calculated) 31.92    Single Leg Stand 30 seconds             Post Biometrics - 06/05/21 0756        Post  Biometrics   Height 5' 9.5" (1.765 m)    Weight 224 lb 8 oz (101.8 kg)    BMI (Calculated) 32.69    Single Leg Stand 11.2 seconds             Nutrition:  Nutrition Therapy & Goals - 04/09/21 1014       Nutrition Therapy   Diet Heart healthy, low Na, pulmonary MNT    Drug/Food Interactions Statins/Certain  Fruits    Protein (specify units) 80g    Fiber 30 grams    Whole Grain Foods 3 servings    Saturated Fats 12 max. grams    Fruits and Vegetables 8 servings/day    Sodium 1.5 grams      Personal Nutrition Goals   Nutrition Goal ST: limit fried foods/high fat meat to <1-2x/week LT: limit Na < 1.5g/day, limit saturated fat < 12g/day, 1/2 grains consumed as whole grains    Comments Patient is a 71 y.o. M admitted to rehab for primary dx of dyspnea on exertion. PMH of HCL, GERD, Asthma, COPD, ulcerative colitis. Relevant medications includes lipitor, zocor, prednisone, imodium. PYP: 42. He would like to cut back on greasy foods and include more baked food, he would like to eat more fish. B: [employer] coffee with sausage egg and cheese or buttermilk biscuit, cereal (lactaid and frosted flakes and honey nut cheerios) L: same as dinner D: He likes to grill and use airfyer. He reports eating a lot of baked chicken at restaurants, but will get fried chicken at fast food. Every now and again will go out to get hotdogs. They enjoy broccoli, collards, lima beans, carrots, onions. Will do baked and sweet potatoes especially when going out to eat. Grains: white rice, mac and cheese. He will use mostly olive oil whencooking and some butter. He will salt food after eating, but not much. He will go out to eat 4x/week for lunch or dinner. Drinks: water, tea, soda (1), OJ. He has stable weight. Sometimes when his UC flaires up he will lose weight from diahhrea - he was steady for 12 years, but in the last 3 years they have been switching around his medicaitons. He will stay away from spicy foods. Discussed heart healthy eating and pulmonary MNT.      Intervention Plan   Intervention Prescribe, educate and counsel regarding individualized specific dietary modifications aiming towards targeted core components such as weight, hypertension, lipid management,  diabetes, heart failure and other comorbidities.    Expected  Outcomes Short Term Goal: Understand basic principles of dietary content, such as calories, fat, sodium, cholesterol and nutrients.;Long Term Goal: Adherence to prescribed nutrition plan.;Short Term Goal: A plan has been developed with personal nutrition goals set during dietitian appointment.             Nutrition Discharge:   Education Questionnaire Score:  Knowledge Questionnaire Score - 06/12/21 0801       Knowledge Questionnaire Score   Pre Score 15/18    Post Score 16/18             Goals reviewed with patient; copy given to patient.

## 2021-10-09 ENCOUNTER — Telehealth (HOSPITAL_COMMUNITY): Payer: Self-pay

## 2021-10-09 NOTE — Telephone Encounter (Signed)
Pt is covered thru the [place]. [place] auth# [id number]. ?

## 2021-10-09 NOTE — Telephone Encounter (Signed)
Called patient to see if he is interested in the Pulmonary Rehab Program. Patient expressed interest. Explained scheduling process and went over insurance, patient verbalized understanding. 

## 2021-12-09 ENCOUNTER — Telehealth (HOSPITAL_COMMUNITY): Payer: Self-pay

## 2021-12-15 IMAGING — DX DG CHEST 2V
2 series · 2 of 2 positions shown · non-contrast
Comparison: None.

CLINICAL DATA: Chest pain

EXAM:
CHEST - 2 VIEW

[chest pa]
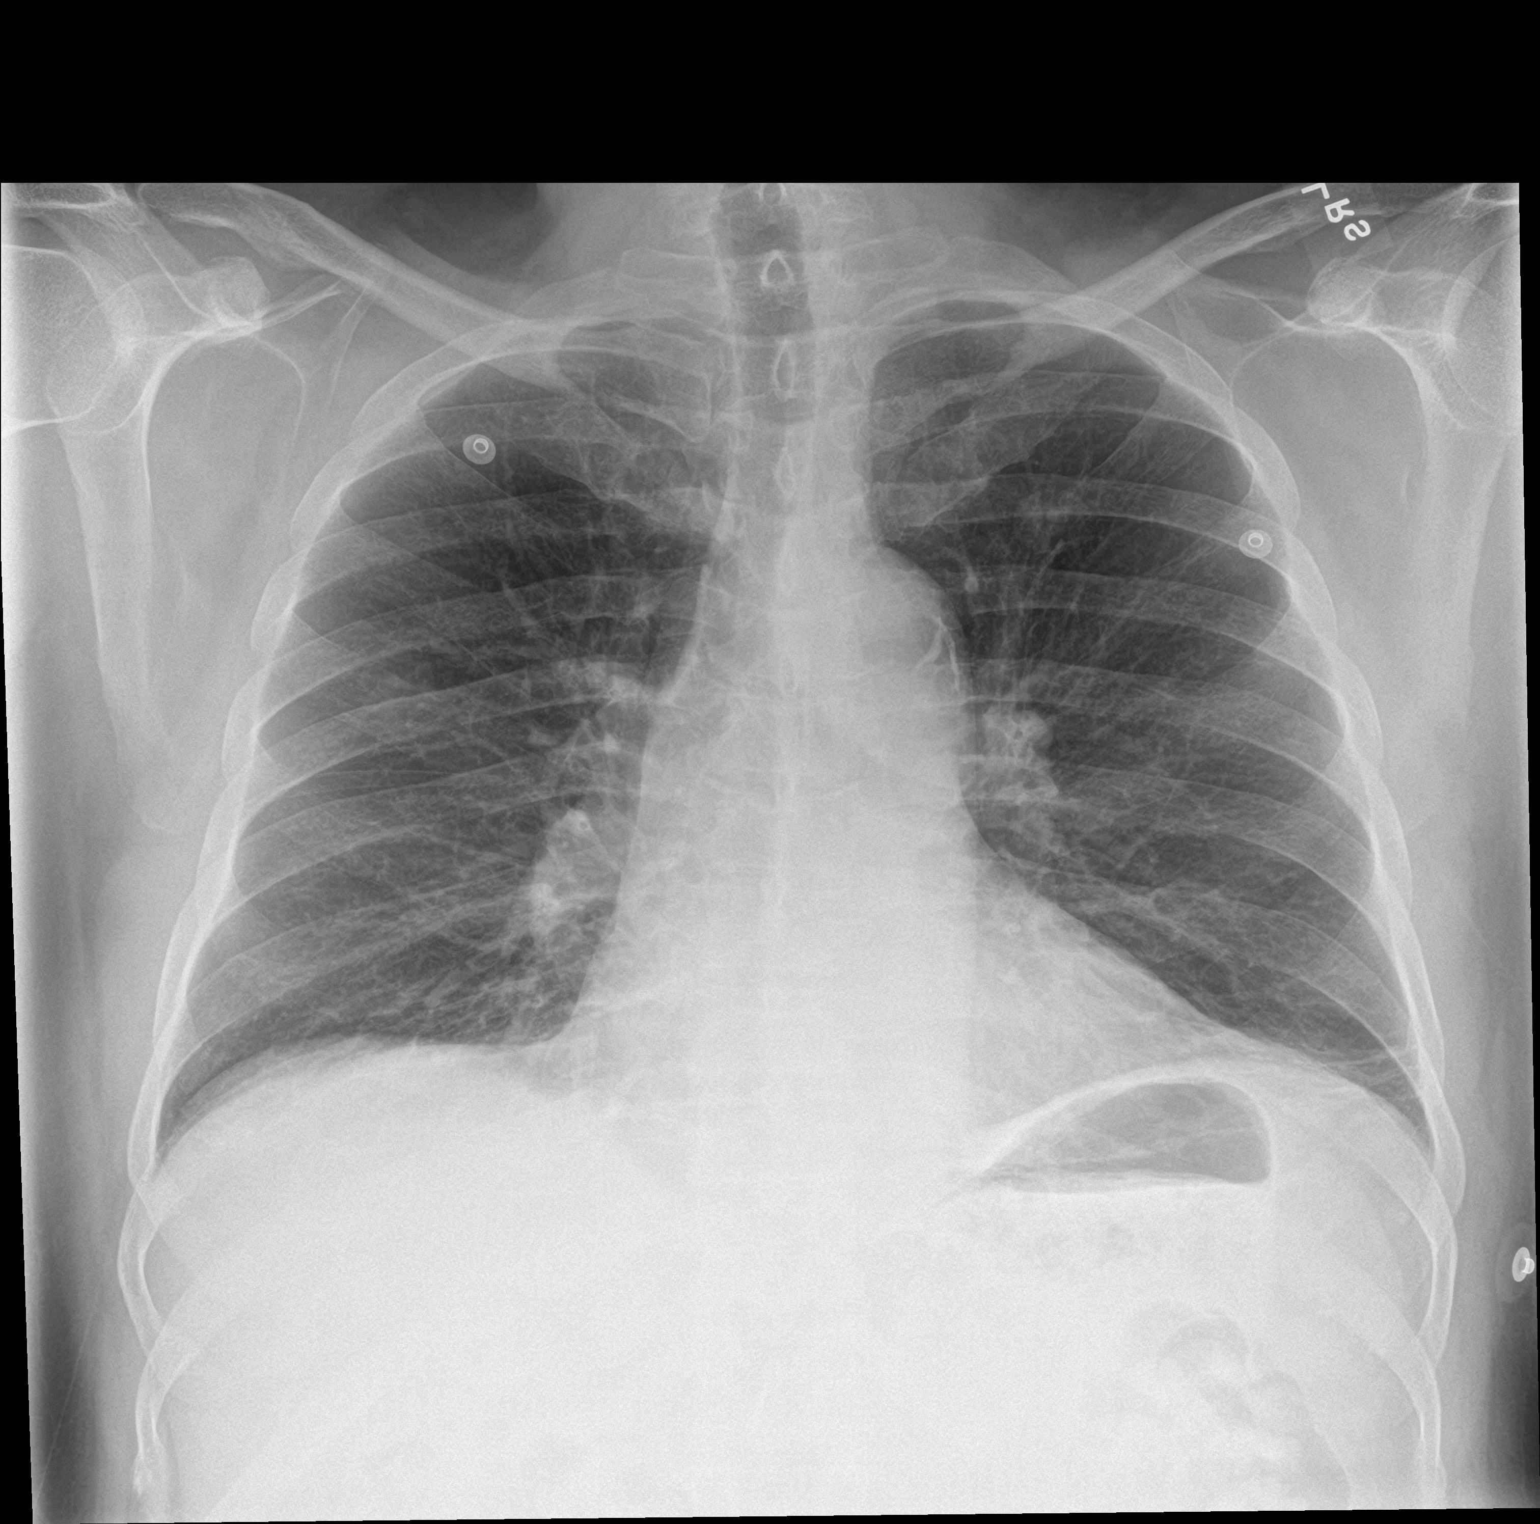

[chest lat]
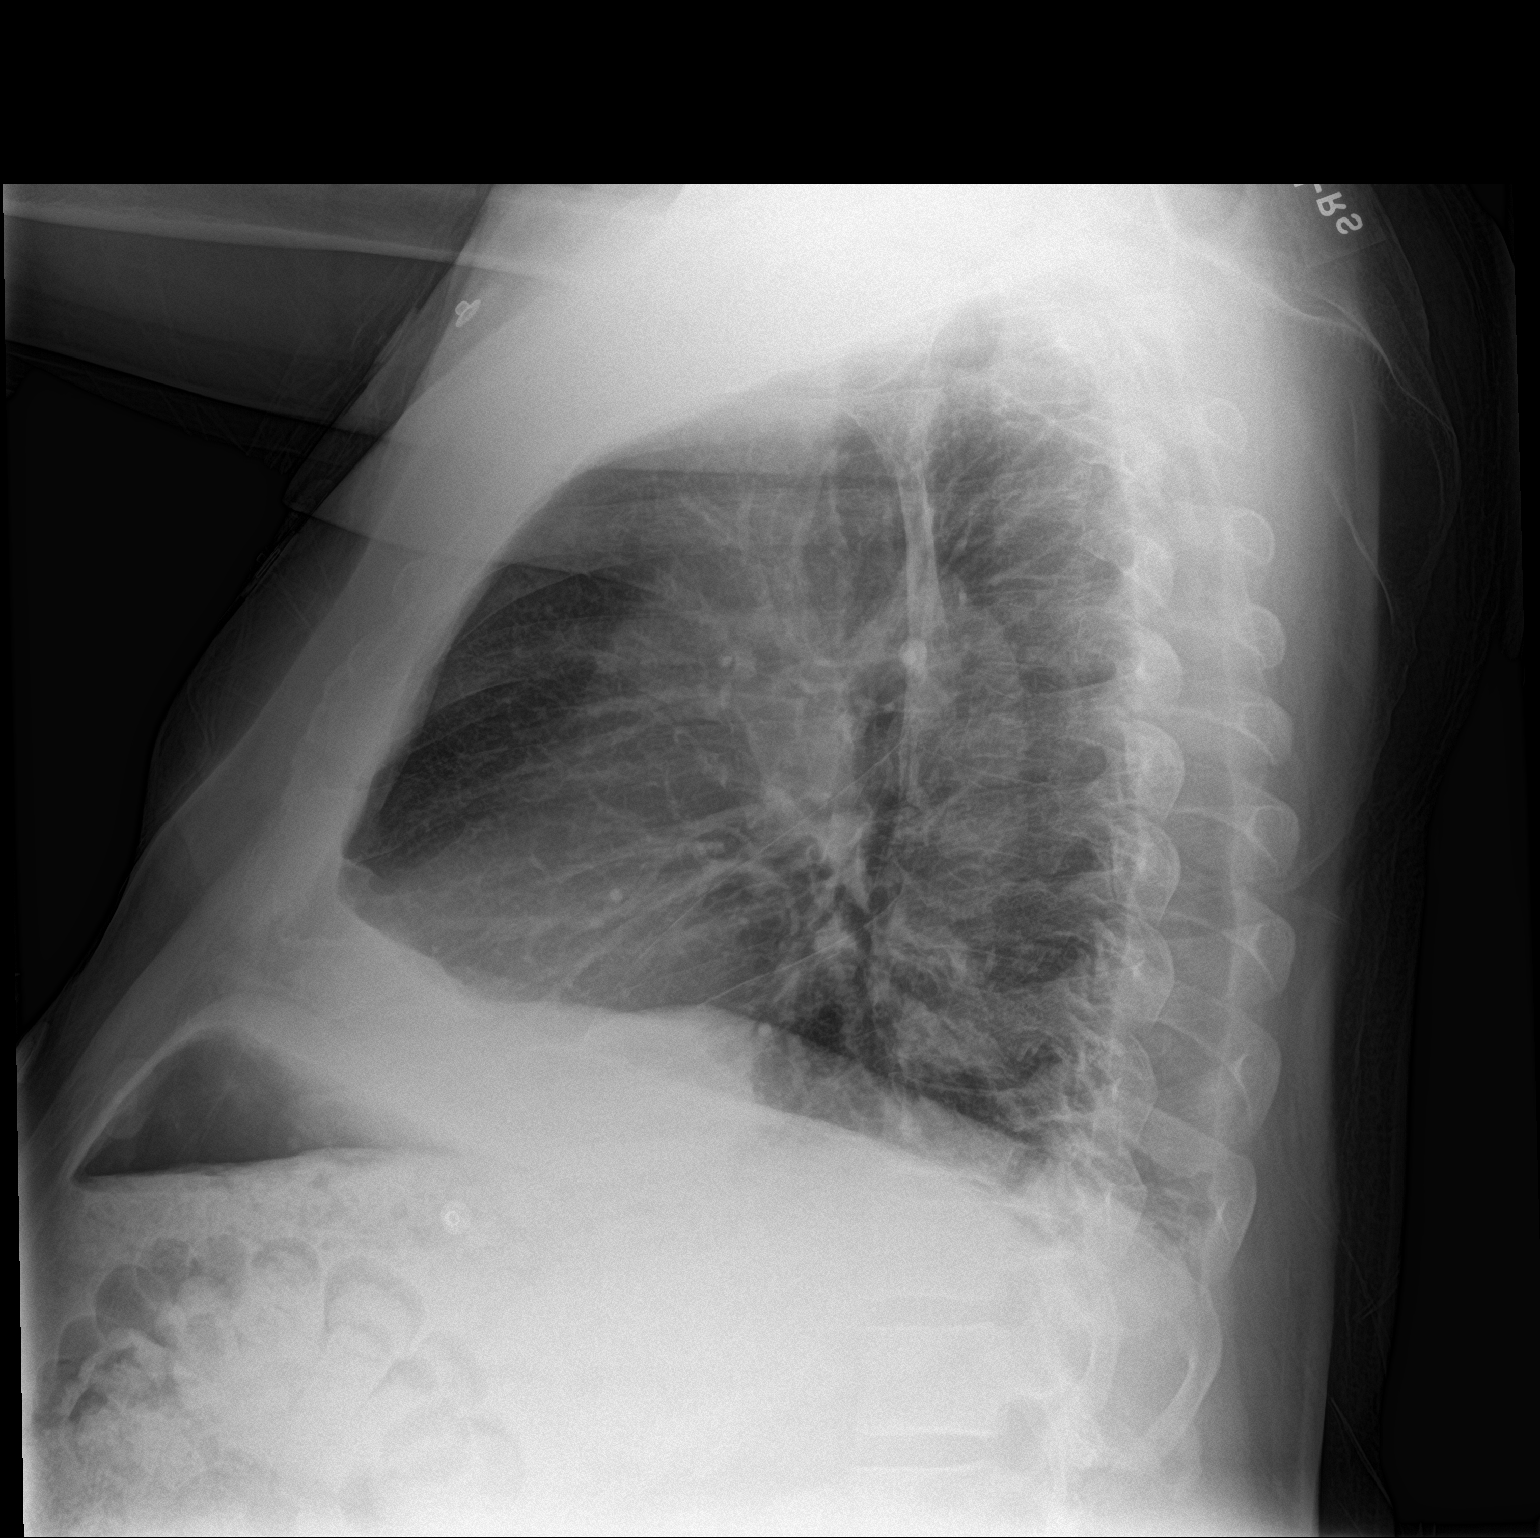

[2 of 2 positions shown; findings below may reference images not displayed]

FINDINGS: The heart size and mediastinal contours are within normal limits.
Left basilar atelectasis. Both lungs are clear. The visualized
skeletal structures are unremarkable.
IMPRESSION: No active cardiopulmonary disease.

## 2023-10-01 ENCOUNTER — Telehealth (HOSPITAL_COMMUNITY): Payer: Self-pay

## 2023-10-01 NOTE — Telephone Encounter (Signed)
 Returned phone call to pt, he stated that he received a letter from the [place] that he was approved for cardiac rehab. I adv pt that we have not received his auth from the [place] and once we receive it and he has been reviewed by the nurse navigator, we will call to get him scheduled.

## 2023-10-11 ENCOUNTER — Telehealth (HOSPITAL_COMMUNITY): Payer: Self-pay

## 2023-10-11 NOTE — Telephone Encounter (Signed)
 Pt is covered thru the [place], [place] [id number].

## 2023-10-11 NOTE — Telephone Encounter (Signed)
 Attempted to call patient in regards to Cardiac Rehab - LM on VM    Pass CR referral to Rn for review.

## 2023-10-13 ENCOUNTER — Encounter (HOSPITAL_COMMUNITY): Payer: Self-pay

## 2023-10-13 ENCOUNTER — Telehealth (HOSPITAL_COMMUNITY): Payer: Self-pay

## 2023-10-13 NOTE — Telephone Encounter (Signed)
 Attempted to call patient in regards to Cardiac Rehab - Unable to leave a VM.   Mailed letter

## 2023-10-26 ENCOUNTER — Encounter (HOSPITAL_COMMUNITY): Payer: Self-pay

## 2023-10-28 ENCOUNTER — Encounter (HOSPITAL_COMMUNITY)
Admission: RE | Admit: 2023-10-28 | Discharge: 2023-10-28 | Disposition: A | Discharge status: Home / Self Care | Source: Ambulatory Visit | Attending: Cardiology | Admitting: Cardiology

## 2023-10-28 VITALS — BP 148/80 | HR 73 | Ht 69.25 in | Wt 215.6 lb

## 2023-10-28 DIAGNOSIS — Z951 Presence of aortocoronary bypass graft: Secondary | ICD-10-CM | POA: Diagnosis present

## 2023-10-28 DIAGNOSIS — Z48812 Encounter for surgical aftercare following surgery on the circulatory system: Secondary | ICD-10-CM | POA: Diagnosis not present

## 2023-10-28 NOTE — Progress Notes (Signed)
 Cardiac Rehab Medication Review   Does the patient  feel that his/her medications are working for him/her?  yes  Has the patient been experiencing any side effects to the medications prescribed?  no  Does the patient measure his/her own blood pressure or blood glucose at home?  yes   Does the patient have any problems obtaining medications due to transportation or finances?   no  Understanding of regimen: good Understanding of indications: good Potential of compliance: good    Comments: Pt feels good with his medication routine and has a BP cuff at home. He has not been instructed to take blood sugar at home. Nurse aware.     Kevin Yang 10/28/2023 1:58 PM

## 2023-10-28 NOTE — Progress Notes (Addendum)
 Cardiac Individual Treatment Plan  Patient Details  Name: Kevin Yang MRN: [id number] Date of Birth: 02-29-1951 Referring Provider:   Flowsheet Row INTENSIVE CARDIAC REHAB ORIENT from 10/28/2023 in [facility]  Referring Provider Dr. Lucinda Saber (Dr. Gaylyn Keas covering)       Initial Encounter Date:  Flowsheet Row INTENSIVE CARDIAC REHAB ORIENT from 10/28/2023 in [facility]  Date 10/28/23       Visit Diagnosis: 08/23/23  S/P CABG x 4 at [facility]  Patient's Home Medications on Admission:  Current Outpatient Medications:    albuterol  (PROVENTIL  HFA;VENTOLIN  HFA) 108 (90 BASE) MCG/ACT inhaler, Inhale 2 puffs into the lungs every 6 (six) hours as needed for wheezing or shortness of breath. , Disp: , Rfl:    atorvastatin (LIPITOR) 80 MG tablet, TAKE ONE TABLET BY MOUTH AT BEDTIME FOR CHOLESTEROL, Disp: , Rfl:    Benzoyl Peroxide 10 % LIQD, WASH AS DIRECTED AFFECTED AREA DAILY : APPLY TO THE SCALP, CHEST AND BACK DAILY IN THE SHOWER. RINSE OFF COMPLETELY. DRY OFF WITH WHITE TOWELS SINCE THIS WASH CAN BLEACH COLORS, Disp: , Rfl:    budesonide-formoterol (SYMBICORT) 160-4.5 MCG/ACT inhaler, Inhale 2 puffs into the lungs 2 (two) times daily., Disp: , Rfl:    Carboxymethylcellulose Sodium 1 % GEL, APPLY 1 DROP TO EACH EYE FOUR TIMES A DAY, Disp: , Rfl:    cetirizine (ZYRTEC) 10 MG tablet, Take 1 tablet by mouth daily., Disp: , Rfl:    diclofenac Sodium (VOLTAREN) 1 % GEL, APPLY 4 GRAMS TO AFFECTED AREA FOUR TIMES A DAY AS NEEDED, Disp: , Rfl:    dicyclomine (BENTYL) 10 MG capsule, TAKE ONE CAPSULE BY MOUTH TWICE A DAY BEFORE MEALS, Disp: , Rfl:    fluticasone (FLONASE) 50 MCG/ACT nasal spray, Place 1 spray into both nostrils as needed. 2 sprays each nostril once a day, Disp: , Rfl:    formoterol (PERFOROMIST) 20 MCG/2ML nebulizer solution, Take 20 mcg  by nebulization 4 (four) times daily., Disp: , Rfl:    HYDROcodone-acetaminophen (NORCO) 10-325 MG tablet, TAKE ONE-HALF TO ONE TABLET BY MOUTH TWICE A DAY AS NEEDED (9/25M,10/77M), Disp: , Rfl:    inFLIXimab-abda 100 MG SOLR, Inject into the vein every 6 (six) weeks., Disp: , Rfl:    ipratropium-albuterol  (DUONEB) 0.5-2.5 (3) MG/3ML SOLN, Inhale 3 mLs into the lungs every 4 (four) hours as needed (for wheezing)., Disp: , Rfl:    lisinopril (ZESTRIL) 10 MG tablet, Take 1 tablet by mouth daily., Disp: , Rfl:    loperamide (IMODIUM) 2 MG capsule, TAKE ONE CAPSULE BY MOUTH AS DIRECTED BY YOUR PROVIDER AS NEEDED    AFTER EACH LOOSE STOOL NO MORE THAN 4 TIMES PER DAY, Disp: , Rfl:    naloxone (NARCAN) nasal spray 4 mg/0.1 mL, SPRAY 1 SPRAY INTO ONE NOSTRIL AS DIRECTED FOR OPIOID OVERDOSE (TURN PERSON ON SIDE AFTER DOSE. IF NO RESPONSE IN 2-3 MINUTES OR PERSON RESPONDS BUT RELAPSES, REPEAT USING A NEW SPRAY DEVICE AND SPRAY INTO THE OTHER NOSTRIL. CALL 911 AFTER USE.) * EMERGENCY USE ONLY *, Disp: , Rfl:    omeprazole (PRILOSEC) 40 MG capsule, TAKE ONE CAPSULE BY MOUTH DAILY (TAKE ON AN EMPTY STOMACH 30 MINUTES PRIOR TO A MEAL), Disp: , Rfl:    predniSONE  (DELTASONE ) 10 MG tablet, TAKE TWO TABLETS BY MOUTH IN THE MORNING -CONTINUE THE 20 MG DAILY UNTIL FURTHER INSTRUCTED (  TAKE WITH FOOD), Disp: , Rfl:    sildenafil  (VIAGRA ) 50 MG tablet, Take 1 tablet (50 mg total) by mouth as needed for erectile dysfunction., Disp: 10 tablet, Rfl: 2   simvastatin (ZOCOR) 80 MG tablet, Take 80 mg by mouth at bedtime., Disp: , Rfl:    tamsulosin (FLOMAX) 0.4 MG CAPS, Take 0.4 mg by mouth at bedtime., Disp: , Rfl:   Past Medical History: Past Medical History:  Diagnosis Date   Asthma    Colitis    COPD (chronic obstructive pulmonary disease) (HCC)    Crohn's disease (HCC)     Tobacco Use: Social History   Tobacco Use  Smoking Status Former   Types: Cigars   Quit date: 09/07/1986   Years since quitting: 37.1   Smokeless Tobacco Never    Labs: Review Flowsheet       Latest Ref Rng & Units 04/22/2007 04/07/2009 04/08/2009 10/09/2015  Labs for ITP Cardiac and Pulmonary Rehab  Cholestrol 0 - 200 mg/dL 161  - 096        ATP III CLASSIFICATION:  <200     mg/dL   Desirable  045-409  mg/dL   Borderline High  >=811    mg/dL   High         914   LDL (calc) 0 - 99 mg/dL - - 782        Total Cholesterol/HDL:CHD Risk Coronary Heart Disease Risk Table                     Men   Women  1/2 Average Risk   3.4   3.3  Average Risk       5.0   4.4  2 X Average Risk   9.6   7.1  3 X Average Risk  23.4   11.0        Use the calculated Patient Ratio above and the CHD Risk Table to determine the patient's CHD Risk.        ATP III CLASSIFICATION (LDL):  <100     mg/dL   Optimal  956-213  mg/dL   Near or Above                    Optimal  130-159  mg/dL   Borderline  086-578  mg/dL   High  >469     mg/dL   Very High  629   Direct LDL mg/dL 528.4  - - -  HDL-C >13.24 mg/dL 40.1  - 40  02.72   Trlycerides 0.0 - 149.0 mg/dL 536  - 644  034.7   TCO2 0 - 100 mmol/L - 30  - -    Capillary Blood Glucose: No results found for: "GLUCAP"   Exercise Target Goals: Exercise Program Goal: Individual exercise prescription set using results from initial 6 min walk test and THRR while considering  patient's activity barriers and safety.   Exercise Prescription Goal: Initial exercise prescription builds to 30-45 minutes a day of aerobic activity, 2-3 days per week.  Home exercise guidelines will be given to patient during program as part of exercise prescription that the participant will acknowledge.  Activity Barriers & Risk Stratification:  Activity Barriers & Cardiac Risk Stratification - 10/28/23 1336       Activity Barriers & Cardiac Risk Stratification   Activity Barriers Back Problems;Shortness of Breath;Balance Concerns    Cardiac Risk Stratification High  6 Minute Walk:  6 Minute  Walk     Row Name 10/28/23 1335         6 Minute Walk   Phase Initial     Distance 1405 feet     Walk Time 6 minutes     # of Rest Breaks 0     MPH 2.66     METS 2.99     RPE 12     Perceived Dyspnea  2     VO2 Peak 10.46     Symptoms Yes (comment)     Comments Chronic low back pain 4/10     Resting HR 73 bpm     Resting BP 148/80     Resting Oxygen Saturation  95 %     Exercise Oxygen Saturation  during 6 min walk 93 %     Max Ex. HR 92 bpm     Max Ex. BP 168/88     2 Minute Post BP 158/78              Oxygen Initial Assessment:   Oxygen Re-Evaluation:   Oxygen Discharge (Final Oxygen Re-Evaluation):   Initial Exercise Prescription:  Initial Exercise Prescription - 10/28/23 1300       Date of Initial Exercise RX and Referring Provider   Date 10/28/23    Referring Provider Dr. Lucinda Saber (Dr. Gaylyn Keas covering)    Expected Discharge Date 01/19/24      Treadmill   MPH 2.5    Grade 0    Minutes 15    METs 2.91      Recumbant Bike   Level 2    RPM 35    Watts 50    Minutes 15    METs 2.1      Prescription Details   Frequency (times per week) 3    Duration Progress to 30 minutes of continuous aerobic without signs/symptoms of physical distress      Intensity   THRR 40-80% of Max Heartrate 60-119    Ratings of Perceived Exertion 11-13    Perceived Dyspnea 0-4      Progression   Progression Continue progressive overload as per policy without signs/symptoms or physical distress.      Resistance Training   Training Prescription Yes    Weight 3    Reps 10-15             Perform Capillary Blood Glucose checks as needed.  Exercise Prescription Changes:   Exercise Comments:   Exercise Goals and Review:   Exercise Goals     Row Name 10/28/23 1343             Exercise Goals   Increase Physical Activity Yes       Intervention Provide advice, education, support and counseling about physical activity/exercise needs.;Develop an  individualized exercise prescription for aerobic and resistive training based on initial evaluation findings, risk stratification, comorbidities and participant's personal goals.       Expected Outcomes Short Term: Attend rehab on a regular basis to increase amount of physical activity.;Long Term: Add in home exercise to make exercise part of routine and to increase amount of physical activity.;Long Term: Exercising regularly at least 3-5 days a week.       Increase Strength and Stamina Yes       Intervention Provide advice, education, support and counseling about physical activity/exercise needs.;Develop an individualized exercise prescription for aerobic and resistive training based on initial evaluation findings, risk stratification, comorbidities and  participant's personal goals.       Expected Outcomes Short Term: Increase workloads from initial exercise prescription for resistance, speed, and METs.;Short Term: Perform resistance training exercises routinely during rehab and add in resistance training at home;Long Term: Improve cardiorespiratory fitness, muscular endurance and strength as measured by increased METs and functional capacity ( )       Able to understand and use rate of perceived exertion (RPE) scale Yes       Intervention Provide education and explanation on how to use RPE scale       Expected Outcomes Short Term: Able to use RPE daily in rehab to express subjective intensity level;Long Term:  Able to use RPE to guide intensity level when exercising independently       Able to understand and use Dyspnea scale Yes       Intervention Provide education and explanation on how to use Dyspnea scale       Expected Outcomes Short Term: Able to use Dyspnea scale daily in rehab to express subjective sense of shortness of breath during exertion;Long Term: Able to use Dyspnea scale to guide intensity level when exercising independently       Knowledge and understanding of Target Heart Rate Range  (THRR) Yes       Intervention Provide education and explanation of THRR including how the numbers were predicted and where they are located for reference       Expected Outcomes Short Term: Able to state/look up THRR;Short Term: Able to use daily as guideline for intensity in rehab;Long Term: Able to use THRR to govern intensity when exercising independently       Understanding of Exercise Prescription Yes       Intervention Provide education, explanation, and written materials on patient's individual exercise prescription       Expected Outcomes Short Term: Able to explain program exercise prescription;Long Term: Able to explain home exercise prescription to exercise independently                Exercise Goals Re-Evaluation :   Discharge Exercise Prescription (Final Exercise Prescription Changes):   Nutrition:  Target Goals: Understanding of nutrition guidelines, daily intake of sodium 1500mg , cholesterol 200mg , calories 30% from fat and 7% or less from saturated fats, daily to have 5 or more servings of fruits and vegetables.  Biometrics:  Pre Biometrics - 10/28/23 1334       Pre Biometrics   Height 5' 9.25" (1.759 m)    Waist Circumference 45 inches    Hip Circumference 41 inches    Waist to Hip Ratio 1.1 %    BMI (Calculated) 31.61    Triceps Skinfold 15 mm    % Body Fat 31.3 %    Grip Strength 21 kg    Flexibility 10.5 in    Single Leg Stand 22 seconds              Nutrition Therapy Plan and Nutrition Goals:   Nutrition Assessments:  MEDIFICTS Score Key: >=70 Need to make dietary changes  40-70 Heart Healthy Diet <= 40 Therapeutic Level Cholesterol Diet   Flowsheet Row Pulmonary Rehab from 06/12/2021 in [facility]iac [facility] Pulmonary [facility]  Picture Your Plate Total Score on Admission 42  Picture Your Plate Total Score on Discharge 46      Picture Your Plate Scores: <84 Unhealthy dietary pattern with much room for improvement. 41-50 Dietary pattern  unlikely to meet recommendations for good health and room for improvement. 51-60 More healthful  dietary pattern, with some room for improvement.  >60 Healthy dietary pattern, although there may be some specific behaviors that could be improved.    Nutrition Goals Re-Evaluation:   Nutrition Goals Re-Evaluation:   Nutrition Goals Discharge (Final Nutrition Goals Re-Evaluation):   Psychosocial: Target Goals: Acknowledge presence or absence of significant depression and/or stress, maximize coping skills, provide positive support system. Participant is able to verbalize types and ability to use techniques and skills needed for reducing stress and depression.  Initial Review & Psychosocial Screening:  Initial Psych Review & Screening - 10/28/23 1235       Initial Review   Current issues with None Identified      Family Dynamics   Good Support System? Yes   Keymarion has his wife, son and friends for support     Barriers   Psychosocial barriers to participate in program The patient should benefit from training in stress management and relaxation.      Screening Interventions   Interventions Encouraged to exercise             Quality of Life Scores:  Quality of Life - 10/28/23 1346       Quality of Life   Select Quality of Life      Quality of Life Scores   Health/Function Pre 22.5 %    Socioeconomic Pre 20.42 %    Psych/Spiritual Pre 24.86 %    Family Pre 24.2 %    GLOBAL Pre 22.88 %            Scores of 19 and below usually indicate a poorer quality of life in these areas.  A difference of  2-3 points is a clinically meaningful difference.  A difference of 2-3 points in the total score of the Quality of Life Index has been associated with significant improvement in overall quality of life, self-image, physical symptoms, and general health in studies assessing change in quality of life.  PHQ-9: Review Flowsheet  More data exists      10/28/2023 06/12/2021 05/05/2021  04/07/2021 03/24/2021  Depression screen PHQ 2/9  Decreased Interest 3 0 2 1 3   Down, Depressed, Hopeless 0 0 0 1 2  PHQ - 2 Score 3 0 2 2 5   Altered sleeping 3 2 0 2 3  Tired, decreased energy 2 2 1 3 3   Change in appetite 2 1 0 0 1  Feeling bad or failure about yourself  0 0 0 0 0  Trouble concentrating 1 0 0 0 1  Moving slowly or fidgety/restless 0 0 0 0 0  Suicidal thoughts 0 0 0 0 0  PHQ-9 Score 11 5 3 7 13   Difficult doing work/chores Somewhat difficult Somewhat difficult Somewhat difficult Somewhat difficult Very difficult   Interpretation of Total Score  Total Score Depression Severity:  1-4 = Minimal depression, 5-9 = Mild depression, 10-14 = Moderate depression, 15-19 = Moderately severe depression, 20-27 = Severe depression   Psychosocial Evaluation and Intervention:   Psychosocial Re-Evaluation:   Psychosocial Discharge (Final Psychosocial Re-Evaluation):   Vocational Rehabilitation: Provide vocational rehab assistance to qualifying candidates.   Vocational Rehab Evaluation & Intervention:  Vocational Rehab - 10/28/23 1236       Initial Vocational Rehab Evaluation & Intervention   Assessment shows need for Vocational Rehabilitation --   Eldin is retired and does not need vocational rehab at this time            Education: Education Goals: Education classes will be provided  on a weekly basis, covering required topics. Participant will state understanding/return demonstration of topics presented.     Core Videos: Exercise    Move It!  Clinical staff conducted group or individual video education with verbal and written material and guidebook.  Patient learns the recommended Pritikin exercise program. Exercise with the goal of living a long, healthy life. Some of the health benefits of exercise include controlled diabetes, healthier blood pressure levels, improved cholesterol levels, improved heart and lung capacity, improved sleep, and better body  composition. Everyone should speak with their doctor before starting or changing an exercise routine.  Biomechanical Limitations Clinical staff conducted group or individual video education with verbal and written material and guidebook.  Patient learns how biomechanical limitations can impact exercise and how we can mitigate and possibly overcome limitations to have an impactful and balanced exercise routine.  Body Composition Clinical staff conducted group or individual video education with verbal and written material and guidebook.  Patient learns that body composition (ratio of muscle mass to fat mass) is a key component to assessing overall fitness, rather than body weight alone. Increased fat mass, especially visceral belly fat, can put us  at increased risk for metabolic syndrome, type 2 diabetes, heart disease, and even death. It is recommended to combine diet and exercise (cardiovascular and resistance training) to improve your body composition. Seek guidance from your physician and exercise physiologist before implementing an exercise routine.  Exercise Action Plan Clinical staff conducted group or individual video education with verbal and written material and guidebook.  Patient learns the recommended strategies to achieve and enjoy long-term exercise adherence, including variety, self-motivation, self-efficacy, and positive decision making. Benefits of exercise include fitness, good health, weight management, more energy, better sleep, less stress, and overall well-being.  Medical   Heart Disease Risk Reduction Clinical staff conducted group or individual video education with verbal and written material and guidebook.  Patient learns our heart is our most vital organ as it circulates oxygen, nutrients, white blood cells, and hormones throughout the entire body, and carries waste away. Data supports a plant-based eating plan like the Pritikin Program for its effectiveness in slowing  progression of and reversing heart disease. The video provides a number of recommendations to address heart disease.   Metabolic Syndrome and Belly Fat  Clinical staff conducted group or individual video education with verbal and written material and guidebook.  Patient learns what metabolic syndrome is, how it leads to heart disease, and how one can reverse it and keep it from coming back. You have metabolic syndrome if you have 3 of the following 5 criteria: abdominal obesity, high blood pressure, high triglycerides, low HDL cholesterol, and high blood sugar.  Hypertension and Heart Disease Clinical staff conducted group or individual video education with verbal and written material and guidebook.  Patient learns that high blood pressure, or hypertension, is very common in the [place] . Hypertension is largely due to excessive salt intake, but other important risk factors include being overweight, physical inactivity, drinking too much alcohol, smoking, and not eating enough potassium from fruits and vegetables. High blood pressure is a leading risk factor for heart attack, stroke, congestive heart failure, dementia, kidney failure, and premature death. Long-term effects of excessive salt intake include stiffening of the arteries and thickening of heart muscle and organ damage. Recommendations include ways to reduce hypertension and the risk of heart disease.  Diseases of Our Time - Focusing on Diabetes Clinical staff conducted group or individual video education with  verbal and written material and guidebook.  Patient learns why the best way to stop diseases of our time is prevention, through food and other lifestyle changes. Medicine (such as prescription pills and surgeries) is often only a Band-Aid on the problem, not a long-term solution. Most common diseases of our time include obesity, type 2 diabetes, hypertension, heart disease, and cancer. The Pritikin Program is recommended and has been  proven to help reduce, reverse, and/or prevent the damaging effects of metabolic syndrome.  Nutrition   Overview of the Pritikin Eating Plan  Clinical staff conducted group or individual video education with verbal and written material and guidebook.  Patient learns about the Pritikin Eating Plan for disease risk reduction. The Pritikin Eating Plan emphasizes a wide variety of unrefined, minimally-processed carbohydrates, like fruits, vegetables, whole grains, and legumes. Go, Caution, and Stop food choices are explained. Plant-based and lean animal proteins are emphasized. Rationale provided for low sodium intake for blood pressure control, low added sugars for blood sugar stabilization, and low added fats and oils for coronary artery disease risk reduction and weight management.  Calorie Density  Clinical staff conducted group or individual video education with verbal and written material and guidebook.  Patient learns about calorie density and how it impacts the Pritikin Eating Plan. Knowing the characteristics of the food you choose will help you decide whether those foods will lead to weight gain or weight loss, and whether you want to consume more or less of them. Weight loss is usually a side effect of the Pritikin Eating Plan because of its focus on low calorie-dense foods.  Label Reading  Clinical staff conducted group or individual video education with verbal and written material and guidebook.  Patient learns about the Pritikin recommended label reading guidelines and corresponding recommendations regarding calorie density, added sugars, sodium content, and whole grains.  Dining Out - Part 1  Clinical staff conducted group or individual video education with verbal and written material and guidebook.  Patient learns that restaurant meals can be sabotaging because they can be so high in calories, fat, sodium, and/or sugar. Patient learns recommended strategies on how to positively address  this and avoid unhealthy pitfalls.  Facts on Fats  Clinical staff conducted group or individual video education with verbal and written material and guidebook.  Patient learns that lifestyle modifications can be just as effective, if not more so, as many medications for lowering your risk of heart disease. A Pritikin lifestyle can help to reduce your risk of inflammation and atherosclerosis (cholesterol build-up, or plaque, in the artery walls). Lifestyle interventions such as dietary choices and physical activity address the cause of atherosclerosis. A review of the types of fats and their impact on blood cholesterol levels, along with dietary recommendations to reduce fat intake is also included.  Nutrition Action Plan  Clinical staff conducted group or individual video education with verbal and written material and guidebook.  Patient learns how to incorporate Pritikin recommendations into their lifestyle. Recommendations include planning and keeping personal health goals in mind as an important part of their success.  Healthy Mind-Set    Healthy Minds, Bodies, Hearts  Clinical staff conducted group or individual video education with verbal and written material and guidebook.  Patient learns how to identify when they are stressed. Video will discuss the impact of that stress, as well as the many benefits of stress management. Patient will also be introduced to stress management techniques. The way we think, act, and feel has an impact  on our hearts.  How Our Thoughts Can Heal Our Hearts  Clinical staff conducted group or individual video education with verbal and written material and guidebook.  Patient learns that negative thoughts can cause depression and anxiety. This can result in negative lifestyle behavior and serious health problems. Cognitive behavioral therapy is an effective method to help control our thoughts in order to change and improve our emotional outlook.  Additional  Videos:  Exercise    Improving Performance  Clinical staff conducted group or individual video education with verbal and written material and guidebook.  Patient learns to use a non-linear approach by alternating intensity levels and lengths of time spent exercising to help burn more calories and lose more body fat. Cardiovascular exercise helps improve heart health, metabolism, hormonal balance, blood sugar control, and recovery from fatigue. Resistance training improves strength, endurance, balance, coordination, reaction time, metabolism, and muscle mass. Flexibility exercise improves circulation, posture, and balance. Seek guidance from your physician and exercise physiologist before implementing an exercise routine and learn your capabilities and proper form for all exercise.  Introduction to Yoga  Clinical staff conducted group or individual video education with verbal and written material and guidebook.  Patient learns about yoga, a discipline of the coming together of mind, breath, and body. The benefits of yoga include improved flexibility, improved range of motion, better posture and core strength, increased lung function, weight loss, and positive self-image. Yoga's heart health benefits include lowered blood pressure, healthier heart rate, decreased cholesterol and triglyceride levels, improved immune function, and reduced stress. Seek guidance from your physician and exercise physiologist before implementing an exercise routine and learn your capabilities and proper form for all exercise.  Medical   Aging: Enhancing Your Quality of Life  Clinical staff conducted group or individual video education with verbal and written material and guidebook.  Patient learns key strategies and recommendations to stay in good physical health and enhance quality of life, such as prevention strategies, having an advocate, securing a Health Care Proxy and Power of Attorney, and keeping a list of medications  and system for tracking them. It also discusses how to avoid risk for bone loss.  Biology of Weight Control  Clinical staff conducted group or individual video education with verbal and written material and guidebook.  Patient learns that weight gain occurs because we consume more calories than we burn (eating more, moving less). Even if your body weight is normal, you may have higher ratios of fat compared to muscle mass. Too much body fat puts you at increased risk for cardiovascular disease, heart attack, stroke, type 2 diabetes, and obesity-related cancers. In addition to exercise, following the Pritikin Eating Plan can help reduce your risk.  Decoding Lab Results  Clinical staff conducted group or individual video education with verbal and written material and guidebook.  Patient learns that lab test reflects one measurement whose values change over time and are influenced by many factors, including medication, stress, sleep, exercise, food, hydration, pre-existing medical conditions, and more. It is recommended to use the knowledge from this video to become more involved with your lab results and evaluate your numbers to speak with your doctor.   Diseases of Our Time - Overview  Clinical staff conducted group or individual video education with verbal and written material and guidebook.  Patient learns that according to the CDC, 50% to 70% of chronic diseases (such as obesity, type 2 diabetes, elevated lipids, hypertension, and heart disease) are avoidable through lifestyle improvements including healthier  food choices, listening to satiety cues, and increased physical activity.  Sleep Disorders Clinical staff conducted group or individual video education with verbal and written material and guidebook.  Patient learns how good quality and duration of sleep are important to overall health and well-being. Patient also learns about sleep disorders and how they impact health along with  recommendations to address them, including discussing with a physician.  Nutrition  Dining Out - Part 2 Clinical staff conducted group or individual video education with verbal and written material and guidebook.  Patient learns how to plan ahead and communicate in order to maximize their dining experience in a healthy and nutritious manner. Included are recommended food choices based on the type of restaurant the patient is visiting.   Fueling a Banker conducted group or individual video education with verbal and written material and guidebook.  There is a strong connection between our food choices and our health. Diseases like obesity and type 2 diabetes are very prevalent and are in large-part due to lifestyle choices. The Pritikin Eating Plan provides plenty of food and hunger-curbing satisfaction. It is easy to follow, affordable, and helps reduce health risks.  Menu Workshop  Clinical staff conducted group or individual video education with verbal and written material and guidebook.  Patient learns that restaurant meals can sabotage health goals because they are often packed with calories, fat, sodium, and sugar. Recommendations include strategies to plan ahead and to communicate with the manager, chef, or server to help order a healthier meal.  Planning Your Eating Strategy  Clinical staff conducted group or individual video education with verbal and written material and guidebook.  Patient learns about the Pritikin Eating Plan and its benefit of reducing the risk of disease. The Pritikin Eating Plan does not focus on calories. Instead, it emphasizes high-quality, nutrient-rich foods. By knowing the characteristics of the foods, we choose, we can determine their calorie density and make informed decisions.  Targeting Your Nutrition Priorities  Clinical staff conducted group or individual video education with verbal and written material and guidebook.  Patient learns  that lifestyle habits have a tremendous impact on disease risk and progression. This video provides eating and physical activity recommendations based on your personal health goals, such as reducing LDL cholesterol, losing weight, preventing or controlling type 2 diabetes, and reducing high blood pressure.  Vitamins and Minerals  Clinical staff conducted group or individual video education with verbal and written material and guidebook.  Patient learns different ways to obtain key vitamins and minerals, including through a recommended healthy diet. It is important to discuss all supplements you take with your doctor.   Healthy Mind-Set    Smoking Cessation  Clinical staff conducted group or individual video education with verbal and written material and guidebook.  Patient learns that cigarette smoking and tobacco addiction pose a serious health risk which affects millions of people. Stopping smoking will significantly reduce the risk of heart disease, lung disease, and many forms of cancer. Recommended strategies for quitting are covered, including working with your doctor to develop a successful plan.  Culinary   Becoming a Set designer conducted group or individual video education with verbal and written material and guidebook.  Patient learns that cooking at home can be healthy, cost-effective, quick, and puts them in control. Keys to cooking healthy recipes will include looking at your recipe, assessing your equipment needs, planning ahead, making it simple, choosing cost-effective seasonal ingredients, and limiting the use  of added fats, salts, and sugars.  Cooking - Breakfast and Snacks  Clinical staff conducted group or individual video education with verbal and written material and guidebook.  Patient learns how important breakfast is to satiety and nutrition through the entire day. Recommendations include key foods to eat during breakfast to help stabilize blood sugar  levels and to prevent overeating at meals later in the day. Planning ahead is also a key component.  Cooking - Educational psychologist conducted group or individual video education with verbal and written material and guidebook.  Patient learns eating strategies to improve overall health, including an approach to cook more at home. Recommendations include thinking of animal protein as a side on your plate rather than center stage and focusing instead on lower calorie dense options like vegetables, fruits, whole grains, and plant-based proteins, such as beans. Making sauces in large quantities to freeze for later and leaving the skin on your vegetables are also recommended to maximize your experience.  Cooking - Healthy Salads and Dressing Clinical staff conducted group or individual video education with verbal and written material and guidebook.  Patient learns that vegetables, fruits, whole grains, and legumes are the foundations of the Pritikin Eating Plan. Recommendations include how to incorporate each of these in flavorful and healthy salads, and how to create homemade salad dressings. Proper handling of ingredients is also covered. Cooking - Soups and [employer] - Soups and Desserts Clinical staff conducted group or individual video education with verbal and written material and guidebook.  Patient learns that Pritikin soups and desserts make for easy, nutritious, and delicious snacks and meal components that are low in sodium, fat, sugar, and calorie density, while high in vitamins, minerals, and filling fiber. Recommendations include simple and healthy ideas for soups and desserts.   Overview     The Pritikin Solution Program Overview Clinical staff conducted group or individual video education with verbal and written material and guidebook.  Patient learns that the results of the Pritikin Program have been documented in more than 100 articles published in peer-reviewed  journals, and the benefits include reducing risk factors for (and, in some cases, even reversing) high cholesterol, high blood pressure, type 2 diabetes, obesity, and more! An overview of the three key pillars of the Pritikin Program will be covered: eating well, doing regular exercise, and having a healthy mind-set.  WORKSHOPS  Exercise: Exercise Basics: Building Your Action Plan Clinical staff led group instruction and group discussion with PowerPoint presentation and patient guidebook. To enhance the learning environment the use of posters, models and videos may be added. At the conclusion of this workshop, patients will comprehend the difference between physical activity and exercise, as well as the benefits of incorporating both, into their routine. Patients will understand the FITT (Frequency, Intensity, Time, and Type) principle and how to use it to build an exercise action plan. In addition, safety concerns and other considerations for exercise and cardiac rehab will be addressed by the presenter. The purpose of this lesson is to promote a comprehensive and effective weekly exercise routine in order to improve patients' overall level of fitness.   Managing Heart Disease: Your Path to a Healthier Heart Clinical staff led group instruction and group discussion with PowerPoint presentation and patient guidebook. To enhance the learning environment the use of posters, models and videos may be added.At the conclusion of this workshop, patients will understand the anatomy and physiology of the heart. Additionally, they will understand how  Pritikin's three pillars impact the risk factors, the progression, and the management of heart disease.  The purpose of this lesson is to provide a high-level overview of the heart, heart disease, and how the Pritikin lifestyle positively impacts risk factors.  Exercise Biomechanics Clinical staff led group instruction and group discussion with PowerPoint  presentation and patient guidebook. To enhance the learning environment the use of posters, models and videos may be added. Patients will learn how the structural parts of their bodies function and how these functions impact their daily activities, movement, and exercise. Patients will learn how to promote a neutral spine, learn how to manage pain, and identify ways to improve their physical movement in order to promote healthy living. The purpose of this lesson is to expose patients to common physical limitations that impact physical activity. Participants will learn practical ways to adapt and manage aches and pains, and to minimize their effect on regular exercise. Patients will learn how to maintain good posture while sitting, walking, and lifting.  Balance Training and Fall Prevention  Clinical staff led group instruction and group discussion with PowerPoint presentation and patient guidebook. To enhance the learning environment the use of posters, models and videos may be added. At the conclusion of this workshop, patients will understand the importance of their sensorimotor skills (vision, proprioception, and the vestibular system) in maintaining their ability to balance as they age. Patients will apply a variety of balancing exercises that are appropriate for their current level of function. Patients will understand the common causes for poor balance, possible solutions to these problems, and ways to modify their physical environment in order to minimize their fall risk. The purpose of this lesson is to teach patients about the importance of maintaining balance as they age and ways to minimize their risk of falling.  WORKSHOPS   Nutrition:  Fueling a Ship broker led group instruction and group discussion with PowerPoint presentation and patient guidebook. To enhance the learning environment the use of posters, models and videos may be added. Patients will review the  foundational principles of the Pritikin Eating Plan and understand what constitutes a serving size in each of the food groups. Patients will also learn Pritikin-friendly foods that are better choices when away from home and review make-ahead meal and snack options. Calorie density will be reviewed and applied to three nutrition priorities: weight maintenance, weight loss, and weight gain. The purpose of this lesson is to reinforce (in a group setting) the key concepts around what patients are recommended to eat and how to apply these guidelines when away from home by planning and selecting Pritikin-friendly options. Patients will understand how calorie density may be adjusted for different weight management goals.  Mindful Eating  Clinical staff led group instruction and group discussion with PowerPoint presentation and patient guidebook. To enhance the learning environment the use of posters, models and videos may be added. Patients will briefly review the concepts of the Pritikin Eating Plan and the importance of low-calorie dense foods. The concept of mindful eating will be introduced as well as the importance of paying attention to internal hunger signals. Triggers for non-hunger eating and techniques for dealing with triggers will be explored. The purpose of this lesson is to provide patients with the opportunity to review the basic principles of the Pritikin Eating Plan, discuss the value of eating mindfully and how to measure internal cues of hunger and fullness using the Hunger Scale. Patients will also discuss reasons for non-hunger eating  and learn strategies to use for controlling emotional eating.  Targeting Your Nutrition Priorities Clinical staff led group instruction and group discussion with PowerPoint presentation and patient guidebook. To enhance the learning environment the use of posters, models and videos may be added. Patients will learn how to determine their genetic susceptibility to  disease by reviewing their family history. Patients will gain insight into the importance of diet as part of an overall healthy lifestyle in mitigating the impact of genetics and other environmental insults. The purpose of this lesson is to provide patients with the opportunity to assess their personal nutrition priorities by looking at their family history, their own health history and current risk factors. Patients will also be able to discuss ways of prioritizing and modifying the Pritikin Eating Plan for their highest risk areas  Menu  Clinical staff led group instruction and group discussion with PowerPoint presentation and patient guidebook. To enhance the learning environment the use of posters, models and videos may be added. Using menus brought in from [employer], or printed from [employer], patients will apply the Pritikin dining out guidelines that were presented in the [employer] video. Patients will also be able to practice these guidelines in a variety of provided scenarios. The purpose of this lesson is to provide patients with the opportunity to practice hands-on learning of the Pritikin Dining Out guidelines with actual menus and practice scenarios.  Label Reading Clinical staff led group instruction and group discussion with PowerPoint presentation and patient guidebook. To enhance the learning environment the use of posters, models and videos may be added. Patients will review and discuss the Pritikin label reading guidelines presented in Pritikin's Label Reading Educational series video. Using fool labels brought in from local grocery stores and markets, patients will apply the label reading guidelines and determine if the packaged food meet the Pritikin guidelines. The purpose of this lesson is to provide patients with the opportunity to review, discuss, and practice hands-on learning of the Pritikin Label Reading guidelines with actual packaged food  labels. Cooking School  Pritikin's [employer] are designed to teach patients ways to prepare quick, simple, and affordable recipes at home. The importance of nutrition's role in chronic disease risk reduction is reflected in its emphasis in the overall Pritikin program. By learning how to prepare essential core Pritikin Eating Plan recipes, patients will increase control over what they eat; be able to customize the flavor of foods without the use of added salt, sugar, or fat; and improve the quality of the food they consume. By learning a set of core recipes which are easily assembled, quickly prepared, and affordable, patients are more likely to prepare more healthy foods at home. These workshops focus on convenient breakfasts, simple entres, side dishes, and desserts which can be prepared with minimal effort and are consistent with nutrition recommendations for cardiovascular risk reduction. Cooking [employer] are taught by a Armed forces logistics/support/administrative officer (RD) who has been trained by the [employer]. The chef or RD has a clear understanding of the importance of minimizing - if not completely eliminating - added fat, sugar, and sodium in recipes. Throughout the series of Cooking School Workshop sessions, patients will learn about healthy ingredients and efficient methods of cooking to build confidence in their capability to prepare    Cooking School weekly topics:  Adding Flavor- Sodium-Free  Fast and Healthy Breakfasts  Powerhouse Plant-Based Proteins  Satisfying Salads and Dressings  Simple Sides and Sauces  International Cuisine-Spotlight on the [employer] Zones  Delicious Desserts  Savory Soups  [employer] - Meals in a Astronomer Appetizers and Snacks  Comforting Weekend Breakfasts  One-Pot Wonders   Fast [employer] Your Pritikin Plate  WORKSHOPS   Healthy Mindset (Psychosocial):  Focused Goals, Sustainable  Changes Clinical staff led group instruction and group discussion with PowerPoint presentation and patient guidebook. To enhance the learning environment the use of posters, models and videos may be added. Patients will be able to apply effective goal setting strategies to establish at least one personal goal, and then take consistent, meaningful action toward that goal. They will learn to identify common barriers to achieving personal goals and develop strategies to overcome them. Patients will also gain an understanding of how our mind-set can impact our ability to achieve goals and the importance of cultivating a positive and growth-oriented mind-set. The purpose of this lesson is to provide patients with a deeper understanding of how to set and achieve personal goals, as well as the tools and strategies needed to overcome common obstacles which may arise along the way.  From Head to Heart: The Power of a Healthy Outlook  Clinical staff led group instruction and group discussion with PowerPoint presentation and patient guidebook. To enhance the learning environment the use of posters, models and videos may be added. Patients will be able to recognize and describe the impact of emotions and mood on physical health. They will discover the importance of self-care and explore self-care practices which may work for them. Patients will also learn how to utilize the 4 C's to cultivate a healthier outlook and better manage stress and challenges. The purpose of this lesson is to demonstrate to patients how a healthy outlook is an essential part of maintaining good health, especially as they continue their cardiac rehab journey.  Healthy Sleep for a Healthy Heart Clinical staff led group instruction and group discussion with PowerPoint presentation and patient guidebook. To enhance the learning environment the use of posters, models and videos may be added. At the conclusion of this workshop, patients will be able  to demonstrate knowledge of the importance of sleep to overall health, well-being, and quality of life. They will understand the symptoms of, and treatments for, common sleep disorders. Patients will also be able to identify daytime and nighttime behaviors which impact sleep, and they will be able to apply these tools to help manage sleep-related challenges. The purpose of this lesson is to provide patients with a general overview of sleep and outline the importance of quality sleep. Patients will learn about a few of the most common sleep disorders. Patients will also be introduced to the concept of "sleep hygiene," and discover ways to self-manage certain sleeping problems through simple daily behavior changes. Finally, the workshop will motivate patients by clarifying the links between quality sleep and their goals of heart-healthy living.   Recognizing and Reducing Stress Clinical staff led group instruction and group discussion with PowerPoint presentation and patient guidebook. To enhance the learning environment the use of posters, models and videos may be added. At the conclusion of this workshop, patients will be able to understand the types of stress reactions, differentiate between acute and chronic stress, and recognize the impact that chronic stress has on their health. They will also be able to apply different coping mechanisms, such as reframing negative self-talk. Patients will have the opportunity to practice a variety of stress management techniques, such as  deep abdominal breathing, progressive muscle relaxation, and/or guided imagery.  The purpose of this lesson is to educate patients on the role of stress in their lives and to provide healthy techniques for coping with it.  Learning Barriers/Preferences:  Learning Barriers/Preferences - 10/28/23 1347       Learning Barriers/Preferences   Learning Barriers Exercise Concerns   some balance concerns and SOB   Learning Preferences Group  Instruction;Skilled Demonstration;Written Material;Individual Instruction             Education Topics:  Knowledge Questionnaire Score:  Knowledge Questionnaire Score - 10/28/23 1348       Knowledge Questionnaire Score   Pre Score 23/24             Core Components/Risk Factors/Patient Goals at Admission:  Personal Goals and Risk Factors at Admission - 10/28/23 1349       Core Components/Risk Factors/Patient Goals on Admission    Weight Management Yes    Intervention Weight Management: Develop a combined nutrition and exercise program designed to reach desired caloric intake, while maintaining appropriate intake of nutrient and fiber, sodium and fats, and appropriate energy expenditure required for the weight goal.;Weight Management: Provide education and appropriate resources to help participant work on and attain dietary goals.;Weight Management/Obesity: Establish reasonable short term and long term weight goals.;Obesity: Provide education and appropriate resources to help participant work on and attain dietary goals.    Expected Outcomes Short Term: Continue to assess and modify interventions until short term weight is achieved;Long Term: Adherence to nutrition and physical activity/exercise program aimed toward attainment of established weight goal;Weight Maintenance: Understanding of the daily nutrition guidelines, which includes 25-35% calories from fat, 7% or less cal from saturated fats, less than 200mg  cholesterol, less than 1.5gm of sodium, & 5 or more servings of fruits and vegetables daily;Understanding recommendations for meals to include 15-35% energy as protein, 25-35% energy from fat, 35-60% energy from carbohydrates, less than 200mg  of dietary cholesterol, 20-35 gm of total fiber daily;Understanding of distribution of calorie intake throughout the day with the consumption of 4-5 meals/snacks    Diabetes Yes    Intervention Provide education about signs/symptoms and  action to take for hypo/hyperglycemia.;Provide education about proper nutrition, including hydration, and aerobic/resistive exercise prescription along with prescribed medications to achieve blood glucose in normal ranges: Fasting glucose 65-99 mg/dL    Expected Outcomes Short Term: Participant verbalizes understanding of the signs/symptoms and immediate care of hyper/hypoglycemia, proper foot care and importance of medication, aerobic/resistive exercise and nutrition plan for blood glucose control.;Long Term: Attainment of HbA1C < 7%.    Hypertension Yes    Intervention Provide education on lifestyle modifcations including regular physical activity/exercise, weight management, moderate sodium restriction and increased consumption of fresh fruit, vegetables, and low fat dairy, alcohol moderation, and smoking cessation.;Monitor prescription use compliance.    Expected Outcomes Short Term: Continued assessment and intervention until BP is < 140/49mm HG in hypertensive participants. < 130/72mm HG in hypertensive participants with diabetes, heart failure or chronic kidney disease.;Long Term: Maintenance of blood pressure at goal levels.    Lipids Yes    Intervention Provide education and support for participant on nutrition & aerobic/resistive exercise along with prescribed medications to achieve LDL 70mg , HDL >40mg .    Expected Outcomes Short Term: Participant states understanding of desired cholesterol values and is compliant with medications prescribed. Participant is following exercise prescription and nutrition guidelines.;Long Term: Cholesterol controlled with medications as prescribed, with individualized exercise RX and with personalized nutrition plan. Value  goals: LDL < 70mg , HDL > 40 mg.    Personal Goal Other Yes    Personal Goal Pt would like to increase strength, control SOB and get back on TM and bike    Intervention Will continue to monitor pt and progress workloads as tolerated    Expected  Outcomes Pt will achieve his goals             Core Components/Risk Factors/Patient Goals Review:    Core Components/Risk Factors/Patient Goals at Discharge (Final Review):    ITP Comments:  ITP Comments     Row Name 10/28/23 1233           ITP Comments Introduction to [facility]/Intensive cardiac Rehab. Initial Orientation Packet Reviewed with the patient.                Comments: Participant attended orientation for the cardiac rehabilitation program on  10/28/2023  to perform initial intake and exercise walk test. Patient introduced to the [facility] education and orientation packet was reviewed. Completed 6-minute walk test, measurements, initial ITP, and exercise prescription. Vital signs stable. Telemetry-normal sinus rhythm 1st degree HB, asymptomatic. Some exertional SOB due to COPD. Resolves with rest   Service time was from 1035 to 1215.

## 2023-11-01 ENCOUNTER — Encounter (HOSPITAL_COMMUNITY)
Admission: RE | Admit: 2023-11-01 | Discharge: 2023-11-01 | Disposition: A | Discharge status: Home / Self Care | Source: Ambulatory Visit | Attending: Cardiovascular Disease | Admitting: Cardiovascular Disease

## 2023-11-01 DIAGNOSIS — Z951 Presence of aortocoronary bypass graft: Secondary | ICD-10-CM | POA: Insufficient documentation

## 2023-11-01 DIAGNOSIS — Z48812 Encounter for surgical aftercare following surgery on the circulatory system: Secondary | ICD-10-CM | POA: Insufficient documentation

## 2023-11-01 NOTE — Progress Notes (Signed)
 Daily Session Note  Patient Details  Name: Kevin Yang MRN: [id number] Date of Birth: 1951-05-06 Referring Provider:   Flowsheet Row INTENSIVE CARDIAC REHAB ORIENT from 10/28/2023 in [facility]  Referring Provider Dr. Lucinda Saber (Dr. Gaylyn Keas covering)       Encounter Date: 11/01/2023  Check In:  Session Check In - 11/01/23 0703       Check-In   Supervising physician immediately available to respond to emergencies [facility] MD immediately available    Physician(s) Koren Persons, NP    Location [facility]    Staff Present Pablo Boards BS, ACSM-CEP, Exercise Physiologist;Kaylee Nolon Baxter, MS, ACSM-CEP, Exercise Physiologist;Bailey Martina Sledge, MS, Exercise Physiologist;Bocephus Cali Hadassah Letters, RN, MHA    Virtual Visit No    Medication changes reported     No    Fall or balance concerns reported    No    Tobacco Cessation No Change    Current number of cigarettes/nicotine per day     0    Warm-up and Cool-down Performed as group-led instruction    Resistance Training Performed Yes    VAD Patient? No    PAD/SET Patient? No      Pain Assessment   Currently in Pain? No/denies    Pain Score 0-No pain    Multiple Pain Sites No             Capillary Blood Glucose: No results found for this or any previous visit (from the past 24 hours).    Social History   Tobacco Use  Smoking Status Former   Types: Cigars   Quit date: 09/07/1986   Years since quitting: 37.1  Smokeless Tobacco Never    Goals Met:  Independence with exercise equipment Exercise tolerated well No report of concerns or symptoms today Strength training completed today  Goals Unmet:  Not Applicable  Comments: Pt in for cardiac rehab today. Pt tolerated light exercise without difficulty. VSS, telemetry-Sinus rhythm, asymptomatic  Pt oriented to exercise equipment and gym routine. Understanding verbalized.    Dr. Gaylyn Keas is Medical  Director for Cardiac Rehab at [facility].

## 2023-11-03 ENCOUNTER — Encounter (HOSPITAL_COMMUNITY)
Admission: RE | Admit: 2023-11-03 | Discharge: 2023-11-03 | Disposition: A | Discharge status: Home / Self Care | Source: Ambulatory Visit | Attending: Cardiology | Admitting: Cardiology

## 2023-11-03 DIAGNOSIS — Z48812 Encounter for surgical aftercare following surgery on the circulatory system: Secondary | ICD-10-CM | POA: Diagnosis not present

## 2023-11-03 DIAGNOSIS — Z951 Presence of aortocoronary bypass graft: Secondary | ICD-10-CM

## 2023-11-03 NOTE — Progress Notes (Signed)
 Daily Session Note  Patient Details  Name: Kevin Yang MRN: [id number] Date of Birth: 1950/11/22 Referring Provider:   Flowsheet Row INTENSIVE CARDIAC REHAB ORIENT from 10/28/2023 in [facility]  Referring Provider Dr. Lucinda Yang (Dr. Gaylyn Yang covering)       Encounter Date: 11/03/2023  Check In:  Session Check In - 11/03/23 0656       Check-In   Supervising physician immediately available to respond to emergencies [facility] MD immediately available    Physician(s) Kevin Persons, NP    Location [facility]    Staff Present Kevin Yang BS, ACSM-CEP, Exercise Physiologist;Kevin Nolon Baxter, MS, ACSM-CEP, Exercise Physiologist;Kevin Early Hadassah Letters, RN, MHA;Kevin Alexia Angelucci, MS, Exercise Physiologist    Virtual Visit No    Medication changes reported     No    Fall or balance concerns reported    No    Tobacco Cessation No Change    Current number of cigarettes/nicotine per day     0    Warm-up and Cool-down Performed as group-led instruction    Resistance Training Performed No    VAD Patient? No    PAD/SET Patient? No      Pain Assessment   Currently in Pain? No/denies    Pain Score 0-No pain    Multiple Pain Sites No             Capillary Blood Glucose: No results found for this or any previous visit (from the past 24 hours).    Social History   Tobacco Use  Smoking Status Former   Types: Cigars   Quit date: 09/07/1986   Years since quitting: 37.1  Smokeless Tobacco Never    Goals Met:  No report of concerns or symptoms today   Goals Unmet:  Not Applicable  Comments: Discussed PHQ9 score of 11 with Kevin Yang. He denies feeling down or depressed. 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 He has no needs for a referral for counseling at this time and will let us  know if that changes for him. He appreciates the discussion regarding this and believes participating in cardiac  rehab will be beneficial for him for overall health and well-being. We will continue to monitor, provide support and encouragement.   Dr. Gaylyn Yang is Medical Director for Cardiac Rehab at [facility].

## 2023-11-05 ENCOUNTER — Encounter (HOSPITAL_COMMUNITY)
Admission: RE | Admit: 2023-11-05 | Discharge: 2023-11-05 | Disposition: A | Discharge status: Home / Self Care | Source: Ambulatory Visit | Attending: Cardiology | Admitting: Cardiology

## 2023-11-05 DIAGNOSIS — Z48812 Encounter for surgical aftercare following surgery on the circulatory system: Secondary | ICD-10-CM | POA: Diagnosis not present

## 2023-11-05 DIAGNOSIS — Z951 Presence of aortocoronary bypass graft: Secondary | ICD-10-CM

## 2023-11-08 ENCOUNTER — Encounter (HOSPITAL_COMMUNITY)
Admission: RE | Admit: 2023-11-08 | Discharge: 2023-11-08 | Disposition: A | Discharge status: Home / Self Care | Source: Ambulatory Visit | Attending: Cardiology

## 2023-11-08 DIAGNOSIS — Z951 Presence of aortocoronary bypass graft: Secondary | ICD-10-CM

## 2023-11-08 DIAGNOSIS — Z48812 Encounter for surgical aftercare following surgery on the circulatory system: Secondary | ICD-10-CM | POA: Diagnosis not present

## 2023-11-10 ENCOUNTER — Encounter (HOSPITAL_COMMUNITY)
Admission: RE | Admit: 2023-11-10 | Discharge: 2023-11-10 | Disposition: A | Discharge status: Home / Self Care | Source: Ambulatory Visit | Attending: Cardiology | Admitting: Cardiology

## 2023-11-10 DIAGNOSIS — Z48812 Encounter for surgical aftercare following surgery on the circulatory system: Secondary | ICD-10-CM | POA: Diagnosis not present

## 2023-11-10 DIAGNOSIS — Z951 Presence of aortocoronary bypass graft: Secondary | ICD-10-CM

## 2023-11-12 ENCOUNTER — Encounter (HOSPITAL_COMMUNITY)
Admission: RE | Admit: 2023-11-12 | Discharge: 2023-11-12 | Disposition: A | Discharge status: Home / Self Care | Source: Ambulatory Visit | Attending: Cardiology | Admitting: Cardiology

## 2023-11-12 DIAGNOSIS — Z48812 Encounter for surgical aftercare following surgery on the circulatory system: Secondary | ICD-10-CM | POA: Diagnosis not present

## 2023-11-12 DIAGNOSIS — Z951 Presence of aortocoronary bypass graft: Secondary | ICD-10-CM

## 2023-11-15 ENCOUNTER — Encounter (HOSPITAL_COMMUNITY)
Admission: RE | Admit: 2023-11-15 | Discharge: 2023-11-15 | Disposition: A | Discharge status: Home / Self Care | Source: Ambulatory Visit | Attending: Cardiology | Admitting: Cardiology

## 2023-11-15 DIAGNOSIS — Z48812 Encounter for surgical aftercare following surgery on the circulatory system: Secondary | ICD-10-CM | POA: Diagnosis not present

## 2023-11-15 DIAGNOSIS — Z951 Presence of aortocoronary bypass graft: Secondary | ICD-10-CM

## 2023-11-15 NOTE — Progress Notes (Signed)
 CARDIAC REHAB PHASE 2  Reviewed home exercise with pt today. Pt is tolerating exercise well. Pt will continue to exercise on his own by walking and using his TM for 30-45 minutes per session 2 days a week in addition to the 3 days in [facility]. Advised pt on THRR, RPE scale, hydration and temperature/humidity precautions. Reinforced NTG use, S/S to stop exercise and when to call MD vs 911. Encouraged warm up cool down and stretches with exercise sessions. Pt verbalized understanding, all questions were answered and pt was given a copy to take home.    Audrionna Lampton S Imer Foxworth ACSM-CEP 11/15/2023 9:34 AM

## 2023-11-15 NOTE — Progress Notes (Signed)
 Cardiac Individual Treatment Plan  Patient Details  Name: Kevin Yang MRN: [id number] Date of Birth: 20-Nov-1950 Referring Provider:   Flowsheet Row INTENSIVE CARDIAC REHAB ORIENT from 10/28/2023 in [facility]  Referring Provider Dr. Lucinda Saber (Dr. Gaylyn Keas covering)    Initial Encounter Date:  Flowsheet Row INTENSIVE CARDIAC REHAB ORIENT from 10/28/2023 in [facility]  Date 10/28/23    Visit Diagnosis: 08/23/23  S/P CABG x 4 at [facility]  Patient's Home Medications on Admission:  Current Outpatient Medications:    albuterol  (PROVENTIL  HFA;VENTOLIN  HFA) 108 (90 BASE) MCG/ACT inhaler, Inhale 2 puffs into the lungs every 6 (six) hours as needed for wheezing or shortness of breath. , Disp: , Rfl:    atorvastatin (LIPITOR) 80 MG tablet, TAKE ONE TABLET BY MOUTH AT BEDTIME FOR CHOLESTEROL, Disp: , Rfl:    Benzoyl Peroxide 10 % LIQD, WASH AS DIRECTED AFFECTED AREA DAILY : APPLY TO THE SCALP, CHEST AND BACK DAILY IN THE SHOWER. RINSE OFF COMPLETELY. DRY OFF WITH WHITE TOWELS SINCE THIS WASH CAN BLEACH COLORS, Disp: , Rfl:    budesonide-formoterol (SYMBICORT) 160-4.5 MCG/ACT inhaler, Inhale 2 puffs into the lungs 2 (two) times daily., Disp: , Rfl:    Carboxymethylcellulose Sodium 1 % GEL, APPLY 1 DROP TO EACH EYE FOUR TIMES A DAY, Disp: , Rfl:    cetirizine (ZYRTEC) 10 MG tablet, Take 1 tablet by mouth daily., Disp: , Rfl:    diclofenac Sodium (VOLTAREN) 1 % GEL, APPLY 4 GRAMS TO AFFECTED AREA FOUR TIMES A DAY AS NEEDED, Disp: , Rfl:    dicyclomine (BENTYL) 10 MG capsule, TAKE ONE CAPSULE BY MOUTH TWICE A DAY BEFORE MEALS, Disp: , Rfl:    fluticasone (FLONASE) 50 MCG/ACT nasal spray, Place 1 spray into both nostrils as needed. 2 sprays each nostril once a day, Disp: , Rfl:    formoterol (PERFOROMIST) 20 MCG/2ML nebulizer solution, Take 20 mcg by  nebulization 4 (four) times daily., Disp: , Rfl:    HYDROcodone-acetaminophen (NORCO) 10-325 MG tablet, TAKE ONE-HALF TO ONE TABLET BY MOUTH TWICE A DAY AS NEEDED (9/75M,10/64M), Disp: , Rfl:    inFLIXimab-abda 100 MG SOLR, Inject into the vein every 6 (six) weeks., Disp: , Rfl:    ipratropium-albuterol  (DUONEB) 0.5-2.5 (3) MG/3ML SOLN, Inhale 3 mLs into the lungs every 4 (four) hours as needed (for wheezing)., Disp: , Rfl:    lisinopril (ZESTRIL) 10 MG tablet, Take 1 tablet by mouth daily., Disp: , Rfl:    loperamide (IMODIUM) 2 MG capsule, TAKE ONE CAPSULE BY MOUTH AS DIRECTED BY YOUR PROVIDER AS NEEDED    AFTER EACH LOOSE STOOL NO MORE THAN 4 TIMES PER DAY, Disp: , Rfl:    naloxone (NARCAN) nasal spray 4 mg/0.1 mL, SPRAY 1 SPRAY INTO ONE NOSTRIL AS DIRECTED FOR OPIOID OVERDOSE (TURN PERSON ON SIDE AFTER DOSE. IF NO RESPONSE IN 2-3 MINUTES OR PERSON RESPONDS BUT RELAPSES, REPEAT USING A NEW SPRAY DEVICE AND SPRAY INTO THE OTHER NOSTRIL. CALL 911 AFTER USE.) * EMERGENCY USE ONLY *, Disp: , Rfl:    omeprazole (PRILOSEC) 40 MG capsule, TAKE ONE CAPSULE BY MOUTH DAILY (TAKE ON AN EMPTY STOMACH 30 MINUTES PRIOR TO A MEAL), Disp: , Rfl:    predniSONE  (DELTASONE ) 10 MG tablet, TAKE TWO TABLETS BY MOUTH IN THE MORNING -CONTINUE THE 20 MG DAILY UNTIL FURTHER INSTRUCTED (TAKE WITH FOOD), Disp: , Rfl:  sildenafil  (VIAGRA ) 50 MG tablet, Take 1 tablet (50 mg total) by mouth as needed for erectile dysfunction., Disp: 10 tablet, Rfl: 2   simvastatin (ZOCOR) 80 MG tablet, Take 80 mg by mouth at bedtime., Disp: , Rfl:    tamsulosin (FLOMAX) 0.4 MG CAPS, Take 0.4 mg by mouth at bedtime., Disp: , Rfl:   Past Medical History: Past Medical History:  Diagnosis Date   Asthma    Colitis    COPD (chronic obstructive pulmonary disease) (HCC)    Crohn's disease (HCC)     Tobacco Use: Social History   Tobacco Use  Smoking Status Former   Types: Cigars   Quit date: 09/07/1986   Years since quitting: 37.2   Smokeless Tobacco Never    Labs: Review Flowsheet       Latest Ref Rng & Units 04/22/2007 04/07/2009 04/08/2009 10/09/2015  Labs for ITP Cardiac and Pulmonary Rehab  Cholestrol 0 - 200 mg/dL 409  - 811        ATP III CLASSIFICATION:  <200     mg/dL   Desirable  914-782  mg/dL   Borderline High  >=956    mg/dL   High         213   LDL (calc) 0 - 99 mg/dL - - 086        Total Cholesterol/HDL:CHD Risk Coronary Heart Disease Risk Table                     Men   Women  1/2 Average Risk   3.4   3.3  Average Risk       5.0   4.4  2 X Average Risk   9.6   7.1  3 X Average Risk  23.4   11.0        Use the calculated Patient Ratio above and the CHD Risk Table to determine the patient's CHD Risk.        ATP III CLASSIFICATION (LDL):  <100     mg/dL   Optimal  578-469  mg/dL   Near or Above                    Optimal  130-159  mg/dL   Borderline  629-528  mg/dL   High  >413     mg/dL   Very High  244   Direct LDL mg/dL 010.2  - - -  HDL-C >72.53 mg/dL 66.4  - 40  40.34   Trlycerides 0.0 - 149.0 mg/dL 742  - 595  638.7   TCO2 0 - 100 mmol/L - 30  - -    Capillary Blood Glucose: No results found for: GLUCAP   Exercise Target Goals: Exercise Program Goal: Individual exercise prescription set using results from initial 6 min walk test and THRR while considering  patient's activity barriers and safety.   Exercise Prescription Goal: Initial exercise prescription builds to 30-45 minutes a day of aerobic activity, 2-3 days per week.  Home exercise guidelines will be given to patient during program as part of exercise prescription that the participant will acknowledge.  Activity Barriers & Risk Stratification:  Activity Barriers & Cardiac Risk Stratification - 10/28/23 1336       Activity Barriers & Cardiac Risk Stratification   Activity Barriers Back Problems;Shortness of Breath;Balance Concerns    Cardiac Risk Stratification High          6 Minute Walk:  6 Minute Walk  Row Name 10/28/23 1335         6 Minute Walk   Phase Initial     Distance 1405 feet     Walk Time 6 minutes     # of Rest Breaks 0     MPH 2.66     METS 2.99     RPE 12     Perceived Dyspnea  2     VO2 Peak 10.46     Symptoms Yes (comment)     Comments Chronic low back pain 4/10     Resting HR 73 bpm     Resting BP 148/80     Resting Oxygen Saturation  95 %     Exercise Oxygen Saturation  during 6 min walk 93 %     Max Ex. HR 92 bpm     Max Ex. BP 168/88     2 Minute Post BP 158/78        Oxygen Initial Assessment:   Oxygen Re-Evaluation:   Oxygen Discharge (Final Oxygen Re-Evaluation):   Initial Exercise Prescription:  Initial Exercise Prescription - 10/28/23 1300       Date of Initial Exercise RX and Referring Provider   Date 10/28/23    Referring Provider Dr. Lucinda Saber (Dr. Gaylyn Keas covering)    Expected Discharge Date 01/19/24      Treadmill   MPH 2.5    Grade 0    Minutes 15    METs 2.91      Recumbant Bike   Level 2    RPM 35    Watts 50    Minutes 15    METs 2.1      Prescription Details   Frequency (times per week) 3    Duration Progress to 30 minutes of continuous aerobic without signs/symptoms of physical distress      Intensity   THRR 40-80% of Max Heartrate 60-119    Ratings of Perceived Exertion 11-13    Perceived Dyspnea 0-4      Progression   Progression Continue progressive overload as per policy without signs/symptoms or physical distress.      Resistance Training   Training Prescription Yes    Weight 3    Reps 10-15          Perform Capillary Blood Glucose checks as needed.  Exercise Prescription Changes:   Exercise Prescription Changes     Row Name 11/01/23 0830 11/15/23 0924           Response to Exercise   Blood Pressure (Admit) 146/82 150/80      Blood Pressure (Exercise) 158/82 152/70      Blood Pressure (Exit) 132/74 130/84      Heart Rate (Admit) 73 bpm 74 bpm      Heart Rate (Exercise) 97 bpm  104 bpm      Heart Rate (Exit) 83 bpm 79 bpm      Rating of Perceived Exertion (Exercise) 13 12.5      Perceived Dyspnea (Exercise) 1 1      Symptoms none none      Comments Pt first day in the [facility] ICR program Reviewed MET's, goals and home ExRx      Duration Progress to 30 minutes of  aerobic without signs/symptoms of physical distress Progress to 30 minutes of  aerobic without signs/symptoms of physical distress      Intensity THRR unchanged THRR unchanged        Progression   Progression Continue to progress workloads  to maintain intensity without signs/symptoms of physical distress. Continue to progress workloads to maintain intensity without signs/symptoms of physical distress.      Average METs 2.46 2.56        Resistance Training   Training Prescription Yes Yes      Weight 3 3      Reps 10-15 10-15      Time 10 Minutes 10 Minutes        Treadmill   MPH 2.5 2.5      Grade 0 0      Minutes 15 15      METs 2.91 2.91        Recumbant Bike   Level 2 2      RPM 67 84      Watts 14 21      Minutes 15 15      METs 2 2.2        Home Exercise Plan   Plans to continue exercise at -- Home (comment)      Frequency -- Add 2 additional days to program exercise sessions.      Initial Home Exercises Provided -- 11/15/23         Exercise Comments:   Exercise Comments     Row Name 11/01/23 1652 11/15/23 0934         Exercise Comments Pt first day in the [facility] ICR program. Pt tolerated exercise well with an average MET level of 2.46. Pt is leanring his THRR, RPE and ExRx. He's off to a great start. Pt will continue to exercise and increase WL as tolerated Reviewed MET's, goals and home ExRx. Pt tolerated exercise well with an average MET level of 2.56. Pt is feeling good about his goals and is already feeling an increase in strength and a decrease in SOB. He will continue to exercise by walking and using his TM at home 2 days for 30 mins.         Exercise Goals and  Review:   Exercise Goals     Row Name 10/28/23 1343             Exercise Goals   Increase Physical Activity Yes       Intervention Provide advice, education, support and counseling about physical activity/exercise needs.;Develop an individualized exercise prescription for aerobic and resistive training based on initial evaluation findings, risk stratification, comorbidities and participant's personal goals.       Expected Outcomes Short Term: Attend rehab on a regular basis to increase amount of physical activity.;Long Term: Add in home exercise to make exercise part of routine and to increase amount of physical activity.;Long Term: Exercising regularly at least 3-5 days a week.       Increase Strength and Stamina Yes       Intervention Provide advice, education, support and counseling about physical activity/exercise needs.;Develop an individualized exercise prescription for aerobic and resistive training based on initial evaluation findings, risk stratification, comorbidities and participant's personal goals.       Expected Outcomes Short Term: Increase workloads from initial exercise prescription for resistance, speed, and METs.;Short Term: Perform resistance training exercises routinely during rehab and add in resistance training at home;Long Term: Improve cardiorespiratory fitness, muscular endurance and strength as measured by increased METs and functional capacity ( )       Able to understand and use rate of perceived exertion (RPE) scale Yes       Intervention Provide education and explanation on how to use RPE scale  Expected Outcomes Short Term: Able to use RPE daily in rehab to express subjective intensity level;Long Term:  Able to use RPE to guide intensity level when exercising independently       Able to understand and use Dyspnea scale Yes       Intervention Provide education and explanation on how to use Dyspnea scale       Expected Outcomes Short Term: Able to use  Dyspnea scale daily in rehab to express subjective sense of shortness of breath during exertion;Long Term: Able to use Dyspnea scale to guide intensity level when exercising independently       Knowledge and understanding of Target Heart Rate Range (THRR) Yes       Intervention Provide education and explanation of THRR including how the numbers were predicted and where they are located for reference       Expected Outcomes Short Term: Able to state/look up THRR;Short Term: Able to use daily as guideline for intensity in rehab;Long Term: Able to use THRR to govern intensity when exercising independently       Understanding of Exercise Prescription Yes       Intervention Provide education, explanation, and written materials on patient's individual exercise prescription       Expected Outcomes Short Term: Able to explain program exercise prescription;Long Term: Able to explain home exercise prescription to exercise independently          Exercise Goals Re-Evaluation :  Exercise Goals Re-Evaluation     Row Name 11/01/23 0830 11/15/23 0930           Exercise Goal Re-Evaluation   Exercise Goals Review Increase Physical Activity;Understanding of Exercise Prescription;Increase Strength and Stamina;Knowledge and understanding of Target Heart Rate Range (THRR);Able to understand and use rate of perceived exertion (RPE) scale Increase Physical Activity;Understanding of Exercise Prescription;Increase Strength and Stamina;Knowledge and understanding of Target Heart Rate Range (THRR);Able to understand and use rate of perceived exertion (RPE) scale      Comments Pt first day in the [facility] ICR program. Pt tolerated exercise well with an average MET level of 2.46. Pt is leanring his THRR, RPE and ExRx. He's off to a great start Reviewed MET's, goals and home ExRx. Pt tolerated exercise well with an average MET level of 2.56. Pt is feeling good about his goals and is already feeling an increase in strength and  a decrease in SOB. He will continue to exercise by walking and using his TM at home 2 days for 30 mins.      Expected Outcomes Pt will continue to exercise and increase WL as tolerated Pt will continue to exercise and increase WL as tolerated         Discharge Exercise Prescription (Final Exercise Prescription Changes):  Exercise Prescription Changes - 11/15/23 0924       Response to Exercise   Blood Pressure (Admit) 150/80    Blood Pressure (Exercise) 152/70    Blood Pressure (Exit) 130/84    Heart Rate (Admit) 74 bpm    Heart Rate (Exercise) 104 bpm    Heart Rate (Exit) 79 bpm    Rating of Perceived Exertion (Exercise) 12.5    Perceived Dyspnea (Exercise) 1    Symptoms none    Comments Reviewed MET's, goals and home ExRx    Duration Progress to 30 minutes of  aerobic without signs/symptoms of physical distress    Intensity THRR unchanged      Progression   Progression Continue to progress workloads to maintain  intensity without signs/symptoms of physical distress.    Average METs 2.56      Resistance Training   Training Prescription Yes    Weight 3    Reps 10-15    Time 10 Minutes      Treadmill   MPH 2.5    Grade 0    Minutes 15    METs 2.91      Recumbant Bike   Level 2    RPM 84    Watts 21    Minutes 15    METs 2.2      Home Exercise Plan   Plans to continue exercise at Home (comment)    Frequency Add 2 additional days to program exercise sessions.    Initial Home Exercises Provided 11/15/23          Nutrition:  Target Goals: Understanding of nutrition guidelines, daily intake of sodium 1500mg , cholesterol 200mg , calories 30% from fat and 7% or less from saturated fats, daily to have 5 or more servings of fruits and vegetables.  Biometrics:  Pre Biometrics - 10/28/23 1334       Pre Biometrics   Height 5' 9.25 (1.759 m)    Waist Circumference 45 inches    Hip Circumference 41 inches    Waist to Hip Ratio 1.1 %    BMI (Calculated) 31.61     Triceps Skinfold 15 mm    % Body Fat 31.3 %    Grip Strength 21 kg    Flexibility 10.5 in    Single Leg Stand 22 seconds           Nutrition Therapy Plan and Nutrition Goals:  Nutrition Therapy & Goals - 11/01/23 1401       Nutrition Therapy   Diet Heart Healthy Diet    Drug/Food Interactions Statins/Certain Fruits      Personal Nutrition Goals   Nutrition Goal Patient to identify strategies for reducing cardiovascular risk by attending the [facility] education and nutrition series weekly.    Personal Goal #2 Patient to improve diet quality by using the plate method as a guide for meal planning to include lean protein/plant protein, fruits, vegetables, whole grains, nonfat dairy as part of a well-balanced diet.    Comments Patient has medical history of CAD, COPD, Crohn's disease, diverticulitis, DM2, HTN, OSA, CABGx4. He has previously completed pulmonary rehab in 2022 and 2019. LDL is not at goal. A1c is well controlled at this time.   Patient will benefit from participation in intensive cardiac rehab for nutrition education, exercise, and lifestyle modification.      Intervention Plan   Intervention Prescribe, educate and counsel regarding individualized specific dietary modifications aiming towards targeted core components such as weight, hypertension, lipid management, diabetes, heart failure and other comorbidities.;Nutrition handout(s) given to patient.    Expected Outcomes Short Term Goal: Understand basic principles of dietary content, such as calories, fat, sodium, cholesterol and nutrients.;Long Term Goal: Adherence to prescribed nutrition plan.          Nutrition Assessments:  MEDIFICTS Score Key: >=70 Need to make dietary changes  40-70 Heart Healthy Diet <= 40 Therapeutic Level Cholesterol Diet   Flowsheet Row Pulmonary Rehab from 06/12/2021 in [facility]  Picture Your Plate Total Score on Admission 42  Picture Your Plate Total Score on  Discharge 46   Picture Your Plate Scores: <95 Unhealthy dietary pattern with much room for improvement. 41-50 Dietary pattern unlikely to meet recommendations for good health and room  for improvement. 51-60 More healthful dietary pattern, with some room for improvement.  >60 Healthy dietary pattern, although there may be some specific behaviors that could be improved.    Nutrition Goals Re-Evaluation:  Nutrition Goals Re-Evaluation     Row Name 11/01/23 1401             Goals   Current Weight 214 lb 15.2 oz (97.5 kg)       Comment total cholesterol 198, triglycerides 149, HDL 40, LDL 142, A1c 6.6       Expected Outcome Patient has medical history of CAD, COPD, Crohn's disease, diverticulitis, DM2, HTN, OSA, CABGx4. He has previously completed pulmonary rehab in 2022 and 2019. LDL is not at goal. A1c is well controlled at this time. Patient will benefit from participation in intensive cardiac rehab for nutrition education, exercise, and lifestyle modification.          Nutrition Goals Re-Evaluation:  Nutrition Goals Re-Evaluation     Row Name 11/01/23 1401             Goals   Current Weight 214 lb 15.2 oz (97.5 kg)       Comment total cholesterol 198, triglycerides 149, HDL 40, LDL 142, A1c 6.6       Expected Outcome Patient has medical history of CAD, COPD, Crohn's disease, diverticulitis, DM2, HTN, OSA, CABGx4. He has previously completed pulmonary rehab in 2022 and 2019. LDL is not at goal. A1c is well controlled at this time. Patient will benefit from participation in intensive cardiac rehab for nutrition education, exercise, and lifestyle modification.          Nutrition Goals Discharge (Final Nutrition Goals Re-Evaluation):  Nutrition Goals Re-Evaluation - 11/01/23 1401       Goals   Current Weight 214 lb 15.2 oz (97.5 kg)    Comment total cholesterol 198, triglycerides 149, HDL 40, LDL 142, A1c 6.6    Expected Outcome Patient has medical history of CAD, COPD,  Crohn's disease, diverticulitis, DM2, HTN, OSA, CABGx4. He has previously completed pulmonary rehab in 2022 and 2019. LDL is not at goal. A1c is well controlled at this time. Patient will benefit from participation in intensive cardiac rehab for nutrition education, exercise, and lifestyle modification.          Psychosocial: Target Goals: Acknowledge presence or absence of significant depression and/or stress, maximize coping skills, provide positive support system. Participant is able to verbalize types and ability to use techniques and skills needed for reducing stress and depression.  Initial Review & Psychosocial Screening:  Initial Psych Review & Screening - 10/28/23 1235       Initial Review   Current issues with None Identified      Family Dynamics   Good Support System? Yes   Xaiden has his wife, son and friends for support     Barriers   Psychosocial barriers to participate in program The patient should benefit from training in stress management and relaxation.      Screening Interventions   Interventions Encouraged to exercise          Quality of Life Scores:  Quality of Life - 10/28/23 1346       Quality of Life   Select Quality of Life      Quality of Life Scores   Health/Function Pre 22.5 %    Socioeconomic Pre 20.42 %    Psych/Spiritual Pre 24.86 %    Family Pre 24.2 %    GLOBAL Pre 22.88 %  Scores of 19 and below usually indicate a poorer quality of life in these areas.  A difference of  2-3 points is a clinically meaningful difference.  A difference of 2-3 points in the total score of the Quality of Life Index has been associated with significant improvement in overall quality of life, self-image, physical symptoms, and general health in studies assessing change in quality of life.  PHQ-9: Review Flowsheet  More data exists      10/28/2023 06/12/2021 05/05/2021 04/07/2021 03/24/2021  Depression screen PHQ 2/9  Decreased Interest 3 0 2 1 3    Down, Depressed, Hopeless 0 0 0 1 2  PHQ - 2 Score 3 0 2 2 5   Altered sleeping 3 2 0 2 3  Tired, decreased energy 2 2 1 3 3   Change in appetite 2 1 0 0 1  Feeling bad or failure about yourself  0 0 0 0 0  Trouble concentrating 1 0 0 0 1  Moving slowly or fidgety/restless 0 0 0 0 0  Suicidal thoughts 0 0 0 0 0  PHQ-9 Score 11 5 3 7 13   Difficult doing work/chores Somewhat difficult Somewhat difficult Somewhat difficult Somewhat difficult Very difficult   Interpretation of Total Score  Total Score Depression Severity:  1-4 = Minimal depression, 5-9 = Mild depression, 10-14 = Moderate depression, 15-19 = Moderately severe depression, 20-27 = Severe depression   Psychosocial Evaluation and Intervention:   Psychosocial Re-Evaluation:  Psychosocial Re-Evaluation     Row Name 11/11/23 0829             Psychosocial Re-Evaluation   Current issues with None Identified       Comments PHQ9 score reviewed by Hollice Luo RN MHA. Patient  denies feeling down or depressed. 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 He has no needs for a referral for counseling at this time and will let us  know if that changes for him       Interventions Encouraged to attend Cardiac Rehabilitation for the exercise       Continue Psychosocial Services  No Follow up required          Psychosocial Discharge (Final Psychosocial Re-Evaluation):  Psychosocial Re-Evaluation - 11/11/23 0829       Psychosocial Re-Evaluation   Current issues with None Identified    Comments PHQ9 score reviewed by Hollice Luo RN MHA. Patient  denies feeling down or depressed. 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 He has no needs for a referral for counseling at this time and will let us  know if that changes for him    Interventions Encouraged to attend Cardiac Rehabilitation for the exercise    Continue Psychosocial Services  No Follow up required           Vocational Rehabilitation: Provide vocational rehab assistance to qualifying candidates.   Vocational Rehab Evaluation & Intervention:  Vocational Rehab - 10/28/23 1236       Initial Vocational Rehab Evaluation & Intervention   Assessment shows need for Vocational Rehabilitation --   Gared is retired and does not need vocational rehab at this time         Education: Education Goals: Education classes will be provided on a weekly basis, covering required topics. Participant will state understanding/return demonstration of topics presented.    Education     Row Name 11/03/23 0900     Education   Cardiac Education Topics [facility]  Environmental education officer - Meals in a Snap   Instruction Review Code 1- Verbalizes Understanding   Class Start Time 0815   Class Stop Time 0850   Class Time Calculation (min) 35 min    Row Name 11/05/23 0900     Education   Cardiac Education Topics [facility]   Select Core Videos     Core Videos   Educator Exercise Physiologist   Select Exercise Education   Exercise Education Move It!   Instruction Review Code 1- Verbalizes Understanding   Class Start Time [phone]   Class Stop Time 0845   Class Time Calculation (min) 35 min    Row Name 11/08/23 0800     Education   Cardiac Education Topics Pritikin   Glass blower/designer Nutrition   Nutrition Workshop Targeting Your Nutrition Priorities   Instruction Review Code 1- Verbalizes Understanding   Class Start Time 0815   Class Stop Time 0855   Class Time Calculation (min) 40 min    Row Name 11/10/23 0800     Education   Cardiac Education Topics Pritikin   Secondary school teacher School   Educator Dietitian   Weekly Topic One-Pot Wonders   Instruction Review Code 1- Verbalizes Understanding   Class Start Time 0815   Class Stop Time 0850   Class Time  Calculation (min) 35 min    Row Name 11/12/23 0800     Education   Cardiac Education Topics Pritikin   Select Core Videos     Core Videos   Educator Exercise Physiologist   Select General Education   General Education Hypertension and Heart Disease   Instruction Review Code 1- Verbalizes Understanding   Class Start Time [phone]   Class Stop Time 0847   Class Time Calculation (min) 35 min    Row Name 11/15/23 0800     Education   Cardiac Education Topics Pritikin   Select Workshops     Workshops   Educator Exercise Physiologist   Select Psychosocial   Psychosocial Workshop Focused Goals, Sustainable Changes   Instruction Review Code 1- Verbalizes Understanding   Class Start Time 0815   Class Stop Time 0850   Class Time Calculation (min) 35 min      Core Videos: Exercise    Move It!  Clinical staff conducted group or individual video education with verbal and written material and guidebook.  Patient learns the recommended Pritikin exercise program. Exercise with the goal of living a long, healthy life. Some of the health benefits of exercise include controlled diabetes, healthier blood pressure levels, improved cholesterol levels, improved heart and lung capacity, improved sleep, and better body composition. Everyone should speak with their doctor before starting or changing an exercise routine.  Biomechanical Limitations Clinical staff conducted group or individual video education with verbal and written material and guidebook.  Patient learns how biomechanical limitations can impact exercise and how we can mitigate and possibly overcome limitations to have an impactful and balanced exercise routine.  Body Composition Clinical staff conducted group or individual video education with verbal and written material and guidebook.  Patient learns that body composition (ratio of muscle mass to fat mass) is a key component to assessing overall fitness, rather than body weight alone.  Increased fat mass, especially visceral belly fat, can put us  at increased  risk for metabolic syndrome, type 2 diabetes, heart disease, and even death. It is recommended to combine diet and exercise (cardiovascular and resistance training) to improve your body composition. Seek guidance from your physician and exercise physiologist before implementing an exercise routine.  Exercise Action Plan Clinical staff conducted group or individual video education with verbal and written material and guidebook.  Patient learns the recommended strategies to achieve and enjoy long-term exercise adherence, including variety, self-motivation, self-efficacy, and positive decision making. Benefits of exercise include fitness, good health, weight management, more energy, better sleep, less stress, and overall well-being.  Medical   Heart Disease Risk Reduction Clinical staff conducted group or individual video education with verbal and written material and guidebook.  Patient learns our heart is our most vital organ as it circulates oxygen, nutrients, white blood cells, and hormones throughout the entire body, and carries waste away. Data supports a plant-based eating plan like the Pritikin Program for its effectiveness in slowing progression of and reversing heart disease. The video provides a number of recommendations to address heart disease.   Metabolic Syndrome and Belly Fat  Clinical staff conducted group or individual video education with verbal and written material and guidebook.  Patient learns what metabolic syndrome is, how it leads to heart disease, and how one can reverse it and keep it from coming back. You have metabolic syndrome if you have 3 of the following 5 criteria: abdominal obesity, high blood pressure, high triglycerides, low HDL cholesterol, and high blood sugar.  Hypertension and Heart Disease Clinical staff conducted group or individual video education with verbal and written material and  guidebook.  Patient learns that high blood pressure, or hypertension, is very common in the [place] . Hypertension is largely due to excessive salt intake, but other important risk factors include being overweight, physical inactivity, drinking too much alcohol, smoking, and not eating enough potassium from fruits and vegetables. High blood pressure is a leading risk factor for heart attack, stroke, congestive heart failure, dementia, kidney failure, and premature death. Long-term effects of excessive salt intake include stiffening of the arteries and thickening of heart muscle and organ damage. Recommendations include ways to reduce hypertension and the risk of heart disease.  Diseases of Our Time - Focusing on Diabetes Clinical staff conducted group or individual video education with verbal and written material and guidebook.  Patient learns why the best way to stop diseases of our time is prevention, through food and other lifestyle changes. Medicine (such as prescription pills and surgeries) is often only a Band-Aid on the problem, not a long-term solution. Most common diseases of our time include obesity, type 2 diabetes, hypertension, heart disease, and cancer. The Pritikin Program is recommended and has been proven to help reduce, reverse, and/or prevent the damaging effects of metabolic syndrome.  Nutrition   Overview of the Pritikin Eating Plan  Clinical staff conducted group or individual video education with verbal and written material and guidebook.  Patient learns about the Pritikin Eating Plan for disease risk reduction. The Pritikin Eating Plan emphasizes a wide variety of unrefined, minimally-processed carbohydrates, like fruits, vegetables, whole grains, and legumes. Go, Caution, and Stop food choices are explained. Plant-based and lean animal proteins are emphasized. Rationale provided for low sodium intake for blood pressure control, low added sugars for blood sugar stabilization,  and low added fats and oils for coronary artery disease risk reduction and weight management.  Calorie Density  Clinical staff conducted group or individual video education with verbal and  written material and guidebook.  Patient learns about calorie density and how it impacts the Pritikin Eating Plan. Knowing the characteristics of the food you choose will help you decide whether those foods will lead to weight gain or weight loss, and whether you want to consume more or less of them. Weight loss is usually a side effect of the Pritikin Eating Plan because of its focus on low calorie-dense foods.  Label Reading  Clinical staff conducted group or individual video education with verbal and written material and guidebook.  Patient learns about the Pritikin recommended label reading guidelines and corresponding recommendations regarding calorie density, added sugars, sodium content, and whole grains.  Dining Out - Part 1  Clinical staff conducted group or individual video education with verbal and written material and guidebook.  Patient learns that restaurant meals can be sabotaging because they can be so high in calories, fat, sodium, and/or sugar. Patient learns recommended strategies on how to positively address this and avoid unhealthy pitfalls.  Facts on Fats  Clinical staff conducted group or individual video education with verbal and written material and guidebook.  Patient learns that lifestyle modifications can be just as effective, if not more so, as many medications for lowering your risk of heart disease. A Pritikin lifestyle can help to reduce your risk of inflammation and atherosclerosis (cholesterol build-up, or plaque, in the artery walls). Lifestyle interventions such as dietary choices and physical activity address the cause of atherosclerosis. A review of the types of fats and their impact on blood cholesterol levels, along with dietary recommendations to reduce fat intake is also  included.  Nutrition Action Plan  Clinical staff conducted group or individual video education with verbal and written material and guidebook.  Patient learns how to incorporate Pritikin recommendations into their lifestyle. Recommendations include planning and keeping personal health goals in mind as an important part of their success.  Healthy Mind-Set    Healthy Minds, Bodies, Hearts  Clinical staff conducted group or individual video education with verbal and written material and guidebook.  Patient learns how to identify when they are stressed. Video will discuss the impact of that stress, as well as the many benefits of stress management. Patient will also be introduced to stress management techniques. The way we think, act, and feel has an impact on our hearts.  How Our Thoughts Can Heal Our Hearts  Clinical staff conducted group or individual video education with verbal and written material and guidebook.  Patient learns that negative thoughts can cause depression and anxiety. This can result in negative lifestyle behavior and serious health problems. Cognitive behavioral therapy is an effective method to help control our thoughts in order to change and improve our emotional outlook.  Additional Videos:  Exercise    Improving Performance  Clinical staff conducted group or individual video education with verbal and written material and guidebook.  Patient learns to use a non-linear approach by alternating intensity levels and lengths of time spent exercising to help burn more calories and lose more body fat. Cardiovascular exercise helps improve heart health, metabolism, hormonal balance, blood sugar control, and recovery from fatigue. Resistance training improves strength, endurance, balance, coordination, reaction time, metabolism, and muscle mass. Flexibility exercise improves circulation, posture, and balance. Seek guidance from your physician and exercise physiologist before  implementing an exercise routine and learn your capabilities and proper form for all exercise.  Introduction to Yoga  Clinical staff conducted group or individual video education with verbal and written material and  guidebook.  Patient learns about yoga, a discipline of the coming together of mind, breath, and body. The benefits of yoga include improved flexibility, improved range of motion, better posture and core strength, increased lung function, weight loss, and positive self-image. Yoga's heart health benefits include lowered blood pressure, healthier heart rate, decreased cholesterol and triglyceride levels, improved immune function, and reduced stress. Seek guidance from your physician and exercise physiologist before implementing an exercise routine and learn your capabilities and proper form for all exercise.  Medical   Aging: Enhancing Your Quality of Life  Clinical staff conducted group or individual video education with verbal and written material and guidebook.  Patient learns key strategies and recommendations to stay in good physical health and enhance quality of life, such as prevention strategies, having an advocate, securing a Health Care Proxy and Power of Attorney, and keeping a list of medications and system for tracking them. It also discusses how to avoid risk for bone loss.  Biology of Weight Control  Clinical staff conducted group or individual video education with verbal and written material and guidebook.  Patient learns that weight gain occurs because we consume more calories than we burn (eating more, moving less). Even if your body weight is normal, you may have higher ratios of fat compared to muscle mass. Too much body fat puts you at increased risk for cardiovascular disease, heart attack, stroke, type 2 diabetes, and obesity-related cancers. In addition to exercise, following the Pritikin Eating Plan can help reduce your risk.  Decoding Lab Results  Clinical staff  conducted group or individual video education with verbal and written material and guidebook.  Patient learns that lab test reflects one measurement whose values change over time and are influenced by many factors, including medication, stress, sleep, exercise, food, hydration, pre-existing medical conditions, and more. It is recommended to use the knowledge from this video to become more involved with your lab results and evaluate your numbers to speak with your doctor.   Diseases of Our Time - Overview  Clinical staff conducted group or individual video education with verbal and written material and guidebook.  Patient learns that according to the CDC, 50% to 70% of chronic diseases (such as obesity, type 2 diabetes, elevated lipids, hypertension, and heart disease) are avoidable through lifestyle improvements including healthier food choices, listening to satiety cues, and increased physical activity.  Sleep Disorders Clinical staff conducted group or individual video education with verbal and written material and guidebook.  Patient learns how good quality and duration of sleep are important to overall health and well-being. Patient also learns about sleep disorders and how they impact health along with recommendations to address them, including discussing with a physician.  Nutrition  Dining Out - Part 2 Clinical staff conducted group or individual video education with verbal and written material and guidebook.  Patient learns how to plan ahead and communicate in order to maximize their dining experience in a healthy and nutritious manner. Included are recommended food choices based on the type of restaurant the patient is visiting.   Fueling a Banker conducted group or individual video education with verbal and written material and guidebook.  There is a strong connection between our food choices and our health. Diseases like obesity and type 2 diabetes are very  prevalent and are in large-part due to lifestyle choices. The Pritikin Eating Plan provides plenty of food and hunger-curbing satisfaction. It is easy to follow, affordable, and helps reduce health risks.  Menu Workshop  Clinical staff conducted group or individual video education with verbal and written material and guidebook.  Patient learns that restaurant meals can sabotage health goals because they are often packed with calories, fat, sodium, and sugar. Recommendations include strategies to plan ahead and to communicate with the manager, chef, or server to help order a healthier meal.  Planning Your Eating Strategy  Clinical staff conducted group or individual video education with verbal and written material and guidebook.  Patient learns about the Pritikin Eating Plan and its benefit of reducing the risk of disease. The Pritikin Eating Plan does not focus on calories. Instead, it emphasizes high-quality, nutrient-rich foods. By knowing the characteristics of the foods, we choose, we can determine their calorie density and make informed decisions.  Targeting Your Nutrition Priorities  Clinical staff conducted group or individual video education with verbal and written material and guidebook.  Patient learns that lifestyle habits have a tremendous impact on disease risk and progression. This video provides eating and physical activity recommendations based on your personal health goals, such as reducing LDL cholesterol, losing weight, preventing or controlling type 2 diabetes, and reducing high blood pressure.  Vitamins and Minerals  Clinical staff conducted group or individual video education with verbal and written material and guidebook.  Patient learns different ways to obtain key vitamins and minerals, including through a recommended healthy diet. It is important to discuss all supplements you take with your doctor.   Healthy Mind-Set    Smoking Cessation  Clinical staff conducted group  or individual video education with verbal and written material and guidebook.  Patient learns that cigarette smoking and tobacco addiction pose a serious health risk which affects millions of people. Stopping smoking will significantly reduce the risk of heart disease, lung disease, and many forms of cancer. Recommended strategies for quitting are covered, including working with your doctor to develop a successful plan.  Culinary   Becoming a Set designer conducted group or individual video education with verbal and written material and guidebook.  Patient learns that cooking at home can be healthy, cost-effective, quick, and puts them in control. Keys to cooking healthy recipes will include looking at your recipe, assessing your equipment needs, planning ahead, making it simple, choosing cost-effective seasonal ingredients, and limiting the use of added fats, salts, and sugars.  Cooking - Breakfast and Snacks  Clinical staff conducted group or individual video education with verbal and written material and guidebook.  Patient learns how important breakfast is to satiety and nutrition through the entire day. Recommendations include key foods to eat during breakfast to help stabilize blood sugar levels and to prevent overeating at meals later in the day. Planning ahead is also a key component.  Cooking - Educational psychologist conducted group or individual video education with verbal and written material and guidebook.  Patient learns eating strategies to improve overall health, including an approach to cook more at home. Recommendations include thinking of animal protein as a side on your plate rather than center stage and focusing instead on lower calorie dense options like vegetables, fruits, whole grains, and plant-based proteins, such as beans. Making sauces in large quantities to freeze for later and leaving the skin on your vegetables are also recommended to maximize  your experience.  Cooking - Healthy Salads and Dressing Clinical staff conducted group or individual video education with verbal and written material and guidebook.  Patient learns that vegetables, fruits, whole grains, and legumes  are the foundations of the Pritikin Eating Plan. Recommendations include how to incorporate each of these in flavorful and healthy salads, and how to create homemade salad dressings. Proper handling of ingredients is also covered. Cooking - Soups and [employer] - Soups and Desserts Clinical staff conducted group or individual video education with verbal and written material and guidebook.  Patient learns that Pritikin soups and desserts make for easy, nutritious, and delicious snacks and meal components that are low in sodium, fat, sugar, and calorie density, while high in vitamins, minerals, and filling fiber. Recommendations include simple and healthy ideas for soups and desserts.   Overview     The Pritikin Solution Program Overview Clinical staff conducted group or individual video education with verbal and written material and guidebook.  Patient learns that the results of the Pritikin Program have been documented in more than 100 articles published in peer-reviewed journals, and the benefits include reducing risk factors for (and, in some cases, even reversing) high cholesterol, high blood pressure, type 2 diabetes, obesity, and more! An overview of the three key pillars of the Pritikin Program will be covered: eating well, doing regular exercise, and having a healthy mind-set.  WORKSHOPS  Exercise: Exercise Basics: Building Your Action Plan Clinical staff led group instruction and group discussion with PowerPoint presentation and patient guidebook. To enhance the learning environment the use of posters, models and videos may be added. At the conclusion of this workshop, patients will comprehend the difference between physical activity and exercise, as well  as the benefits of incorporating both, into their routine. Patients will understand the FITT (Frequency, Intensity, Time, and Type) principle and how to use it to build an exercise action plan. In addition, safety concerns and other considerations for exercise and cardiac rehab will be addressed by the presenter. The purpose of this lesson is to promote a comprehensive and effective weekly exercise routine in order to improve patients' overall level of fitness.   Managing Heart Disease: Your Path to a Healthier Heart Clinical staff led group instruction and group discussion with PowerPoint presentation and patient guidebook. To enhance the learning environment the use of posters, models and videos may be added.At the conclusion of this workshop, patients will understand the anatomy and physiology of the heart. Additionally, they will understand how Pritikin's three pillars impact the risk factors, the progression, and the management of heart disease.  The purpose of this lesson is to provide a high-level overview of the heart, heart disease, and how the Pritikin lifestyle positively impacts risk factors.  Exercise Biomechanics Clinical staff led group instruction and group discussion with PowerPoint presentation and patient guidebook. To enhance the learning environment the use of posters, models and videos may be added. Patients will learn how the structural parts of their bodies function and how these functions impact their daily activities, movement, and exercise. Patients will learn how to promote a neutral spine, learn how to manage pain, and identify ways to improve their physical movement in order to promote healthy living. The purpose of this lesson is to expose patients to common physical limitations that impact physical activity. Participants will learn practical ways to adapt and manage aches and pains, and to minimize their effect on regular exercise. Patients will learn how to  maintain good posture while sitting, walking, and lifting.  Balance Training and Fall Prevention  Clinical staff led group instruction and group discussion with PowerPoint presentation and patient guidebook. To enhance the learning environment the use of posters,  models and videos may be added. At the conclusion of this workshop, patients will understand the importance of their sensorimotor skills (vision, proprioception, and the vestibular system) in maintaining their ability to balance as they age. Patients will apply a variety of balancing exercises that are appropriate for their current level of function. Patients will understand the common causes for poor balance, possible solutions to these problems, and ways to modify their physical environment in order to minimize their fall risk. The purpose of this lesson is to teach patients about the importance of maintaining balance as they age and ways to minimize their risk of falling.  WORKSHOPS   Nutrition:  Fueling a Ship broker led group instruction and group discussion with PowerPoint presentation and patient guidebook. To enhance the learning environment the use of posters, models and videos may be added. Patients will review the foundational principles of the Pritikin Eating Plan and understand what constitutes a serving size in each of the food groups. Patients will also learn Pritikin-friendly foods that are better choices when away from home and review make-ahead meal and snack options. Calorie density will be reviewed and applied to three nutrition priorities: weight maintenance, weight loss, and weight gain. The purpose of this lesson is to reinforce (in a group setting) the key concepts around what patients are recommended to eat and how to apply these guidelines when away from home by planning and selecting Pritikin-friendly options. Patients will understand how calorie density may be adjusted for different weight management  goals.  Mindful Eating  Clinical staff led group instruction and group discussion with PowerPoint presentation and patient guidebook. To enhance the learning environment the use of posters, models and videos may be added. Patients will briefly review the concepts of the Pritikin Eating Plan and the importance of low-calorie dense foods. The concept of mindful eating will be introduced as well as the importance of paying attention to internal hunger signals. Triggers for non-hunger eating and techniques for dealing with triggers will be explored. The purpose of this lesson is to provide patients with the opportunity to review the basic principles of the [facility] Eating Plan, discuss the value of eating mindfully and how to measure internal cues of hunger and fullness using the Hunger Scale. Patients will also discuss reasons for non-hunger eating and learn strategies to use for controlling emotional eating.  Targeting Your Nutrition Priorities Clinical staff led group instruction and group discussion with PowerPoint presentation and patient guidebook. To enhance the learning environment the use of posters, models and videos may be added. Patients will learn how to determine their genetic susceptibility to disease by reviewing their family history. Patients will gain insight into the importance of diet as part of an overall healthy lifestyle in mitigating the impact of genetics and other environmental insults. The purpose of this lesson is to provide patients with the opportunity to assess their personal nutrition priorities by looking at their family history, their own health history and current risk factors. Patients will also be able to discuss ways of prioritizing and modifying the [facility] Eating Plan for their highest risk areas  Menu  Clinical staff led group instruction and group discussion with PowerPoint presentation and patient guidebook. To enhance the learning environment the use of posters,  models and videos may be added. Using menus brought in from [employer], or printed from [employer], patients will apply the [facility] dining out guidelines that were presented in the [employer] video. Patients will also  be able to practice these guidelines in a variety of provided scenarios. The purpose of this lesson is to provide patients with the opportunity to practice hands-on learning of the [facility] Dining Out guidelines with actual menus and practice scenarios.  Label Reading Clinical staff led group instruction and group discussion with PowerPoint presentation and patient guidebook. To enhance the learning environment the use of posters, models and videos may be added. Patients will review and discuss the [facility] label reading guidelines presented in [employer]'s Label Reading Educational series video. Using fool labels brought in from local grocery stores and markets, patients will apply the label reading guidelines and determine if the packaged food meet the [facility] guidelines. The purpose of this lesson is to provide patients with the opportunity to review, discuss, and practice hands-on learning of the [facility] Label Reading guidelines with actual packaged food labels. Cooking School  [facility]'s [employer] are designed to teach patients ways to prepare quick, simple, and affordable recipes at home. The importance of nutrition's role in chronic disease risk reduction is reflected in its emphasis in the overall [facility] program. By learning how to prepare essential core [facility] Eating Plan recipes, patients will increase control over what they eat; be able to customize the flavor of foods without the use of added salt, sugar, or fat; and improve the quality of the food they consume. By learning a set of core recipes which are easily assembled, quickly prepared, and affordable, patients are more likely to prepare more healthy foods at home. These workshops  focus on convenient breakfasts, simple entres, side dishes, and desserts which can be prepared with minimal effort and are consistent with nutrition recommendations for cardiovascular risk reduction. Cooking [employer] are taught by a Armed forces logistics/support/administrative officer (RD) who has been trained by the [employer]. The chef or RD has a clear understanding of the importance of minimizing - if not completely eliminating - added fat, sugar, and sodium in recipes. Throughout the series of Cooking School Workshop sessions, patients will learn about healthy ingredients and efficient methods of cooking to build confidence in their capability to prepare    Cooking School weekly topics:  Adding Flavor- Sodium-Free  Fast and Healthy Breakfasts  Powerhouse Plant-Based Proteins  Satisfying Salads and Dressings  Simple Sides and Sauces  International Cuisine-Spotlight on the [employer] Zones  Delicious Desserts  Savory Soups  [employer] - Meals in a Astronomer Appetizers and Snacks  Comforting Weekend Breakfasts  One-Pot Wonders   Fast Evening Meals  Landscape architect Your Prit[facility]in Plate  WORKSHOPS   Healthy Mindset (Psychosocial):  Focused Goals, Sustainable Changes Clinical staff led group instruction and group discussion with PowerPoint presentation and patient guidebook. To enhance the learning environment the use of posters, models and videos may be added. Patients will be able to apply effective goal setting strategies to establish at least one personal goal, and then take consistent, meaningful action toward that goal. They will learn to identify common barriers to achieving personal goals and develop strategies to overcome them. Patients will also gain an understanding of how our mind-set can impact our ability to achieve goals and the importance of cultivating a positive and growth-oriented mind-set. The purpose of this lesson is to provide patients with a deeper  understanding of how to set and achieve personal goals, as well as the tools and strategies needed to overcome common obstacles which may arise along the way.  From Head to Heart: The Power  of a Healthy Outlook  Clinical staff led group instruction and group discussion with PowerPoint presentation and patient guidebook. To enhance the learning environment the use of posters, models and videos may be added. Patients will be able to recognize and describe the impact of emotions and mood on physical health. They will discover the importance of self-care and explore self-care practices which may work for them. Patients will also learn how to utilize the 4 C's to cultivate a healthier outlook and better manage stress and challenges. The purpose of this lesson is to demonstrate to patients how a healthy outlook is an essential part of maintaining good health, especially as they continue their cardiac rehab journey.  Healthy Sleep for a Healthy Heart Clinical staff led group instruction and group discussion with PowerPoint presentation and patient guidebook. To enhance the learning environment the use of posters, models and videos may be added. At the conclusion of this workshop, patients will be able to demonstrate knowledge of the importance of sleep to overall health, well-being, and quality of life. They will understand the symptoms of, and treatments for, common sleep disorders. Patients will also be able to identify daytime and nighttime behaviors which impact sleep, and they will be able to apply these tools to help manage sleep-related challenges. The purpose of this lesson is to provide patients with a general overview of sleep and outline the importance of quality sleep. Patients will learn about a few of the most common sleep disorders. Patients will also be introduced to the concept of "sleep hygiene," and discover ways to self-manage certain sleeping problems through simple daily behavior changes.  Finally, the workshop will motivate patients by clarifying the links between quality sleep and their goals of heart-healthy living.   Recognizing and Reducing Stress Clinical staff led group instruction and group discussion with PowerPoint presentation and patient guidebook. To enhance the learning environment the use of posters, models and videos may be added. At the conclusion of this workshop, patients will be able to understand the types of stress reactions, differentiate between acute and chronic stress, and recognize the impact that chronic stress has on their health. They will also be able to apply different coping mechanisms, such as reframing negative self-talk. Patients will have the opportunity to practice a variety of stress management techniques, such as deep abdominal breathing, progressive muscle relaxation, and/or guided imagery.  The purpose of this lesson is to educate patients on the role of stress in their lives and to provide healthy techniques for coping with it.  Learning Barriers/Preferences:  Learning Barriers/Preferences - 10/28/23 1347       Learning Barriers/Preferences   Learning Barriers Exercise Concerns   some balance concerns and SOB   Learning Preferences Group Instruction;Skilled Demonstration;Written Material;Individual Instruction          Education Topics:  Knowledge Questionnaire Score:  Knowledge Questionnaire Score - 10/28/23 1348       Knowledge Questionnaire Score   Pre Score 23/24          Core Components/Risk Factors/Patient Goals at Admission:  Personal Goals and Risk Factors at Admission - 10/28/23 1349       Core Components/Risk Factors/Patient Goals on Admission    Weight Management Yes    Intervention Weight Management: Develop a combined nutrition and exercise program designed to reach desired caloric intake, while maintaining appropriate intake of nutrient and fiber, sodium and fats, and appropriate energy expenditure required  for the weight goal.;Weight Management: Provide education and appropriate resources to help  participant work on and attain dietary goals.;Weight Management/Obesity: Establish reasonable short term and long term weight goals.;Obesity: Provide education and appropriate resources to help participant work on and attain dietary goals.    Expected Outcomes Short Term: Continue to assess and modify interventions until short term weight is achieved;Long Term: Adherence to nutrition and physical activity/exercise program aimed toward attainment of established weight goal;Weight Maintenance: Understanding of the daily nutrition guidelines, which includes 25-35% calories from fat, 7% or less cal from saturated fats, less than 200mg  cholesterol, less than 1.5gm of sodium, & 5 or more servings of fruits and vegetables daily;Understanding recommendations for meals to include 15-35% energy as protein, 25-35% energy from fat, 35-60% energy from carbohydrates, less than 200mg  of dietary cholesterol, 20-35 gm of total fiber daily;Understanding of distribution of calorie intake throughout the day with the consumption of 4-5 meals/snacks    Diabetes Yes    Intervention Provide education about signs/symptoms and action to take for hypo/hyperglycemia.;Provide education about proper nutrition, including hydration, and aerobic/resistive exercise prescription along with prescribed medications to achieve blood glucose in normal ranges: Fasting glucose 65-99 mg/dL    Expected Outcomes Short Term: Participant verbalizes understanding of the signs/symptoms and immediate care of hyper/hypoglycemia, proper foot care and importance of medication, aerobic/resistive exercise and nutrition plan for blood glucose control.;Long Term: Attainment of HbA1C < 7%.    Hypertension Yes    Intervention Provide education on lifestyle modifcations including regular physical activity/exercise, weight management, moderate sodium restriction and increased  consumption of fresh fruit, vegetables, and low fat dairy, alcohol moderation, and smoking cessation.;Monitor prescription use compliance.    Expected Outcomes Short Term: Continued assessment and intervention until BP is < 140/85mm HG in hypertensive participants. < 130/49mm HG in hypertensive participants with diabetes, heart failure or chronic kidney disease.;Long Term: Maintenance of blood pressure at goal levels.    Lipids Yes    Intervention Provide education and support for participant on nutrition & aerobic/resistive exercise along with prescribed medications to achieve LDL 70mg , HDL >40mg .    Expected Outcomes Short Term: Participant states understanding of desired cholesterol values and is compliant with medications prescribed. Participant is following exercise prescription and nutrition guidelines.;Long Term: Cholesterol controlled with medications as prescribed, with individualized exercise RX and with personalized nutrition plan. Value goals: LDL < 70mg , HDL > 40 mg.    Personal Goal Other Yes    Personal Goal Pt would like to increase strength, control SOB and get back on TM and bike    Intervention Will continue to monitor pt and progress workloads as tolerated    Expected Outcomes Pt will achieve his goals          Core Components/Risk Factors/Patient Goals Review:   Goals and Risk Factor Review     Row Name 11/11/23 0830             Core Components/Risk Factors/Patient Goals Review   Personal Goals Review Weight Management/Obesity;Hypertension;Lipids;Diabetes       Review Avram started exercise at cardiac rehab on 11/03/23. Ridley is off to a good start to exercise. Keishon's vital signs have been stable. Riceky is not on any diabetic medications at this time.       Expected Outcomes Kule will continue to participate in cardiac rehab for exercise, nutrition and lifestylle modifications          Core Components/Risk Factors/Patient Goals at Discharge (Final Review):    Goals and Risk Factor Review - 11/11/23 0830       Core  Components/Risk Factors/Patient Goals Review   Personal Goals Review Weight Management/Obesity;Hypertension;Lipids;Diabetes    Review West started exercise at cardiac rehab on 11/03/23. Skylor is off to a good start to exercise. Yul's vital signs have been stable. Riceky is not on any diabetic medications at this time.    Expected Outcomes Ruffin will continue to participate in cardiac rehab for exercise, nutrition and lifestylle modifications          ITP Comments:  ITP Comments     Row Name 10/28/23 1233 11/11/23 0824         ITP Comments Introduction to [facility] Education/Intensive cardiac Rehab. Initial Orientation Packet Reviewed with the patient. 30 Day ITP Review. Lavaughn started cardiac rehab on 11/03/23. Jorma is off to a good start with exercise.         Comments: See ITP comment

## 2023-11-17 ENCOUNTER — Encounter (HOSPITAL_COMMUNITY)
Admission: RE | Admit: 2023-11-17 | Discharge: 2023-11-17 | Disposition: A | Discharge status: Home / Self Care | Source: Ambulatory Visit | Attending: Cardiology

## 2023-11-17 DIAGNOSIS — Z951 Presence of aortocoronary bypass graft: Secondary | ICD-10-CM

## 2023-11-17 DIAGNOSIS — Z48812 Encounter for surgical aftercare following surgery on the circulatory system: Secondary | ICD-10-CM | POA: Diagnosis not present

## 2023-11-19 ENCOUNTER — Encounter (HOSPITAL_COMMUNITY)
Admission: RE | Admit: 2023-11-19 | Discharge: 2023-11-19 | Disposition: A | Discharge status: Home / Self Care | Source: Ambulatory Visit | Attending: Cardiology | Admitting: Cardiology

## 2023-11-19 DIAGNOSIS — Z951 Presence of aortocoronary bypass graft: Secondary | ICD-10-CM

## 2023-11-19 DIAGNOSIS — Z48812 Encounter for surgical aftercare following surgery on the circulatory system: Secondary | ICD-10-CM | POA: Diagnosis not present

## 2023-11-22 ENCOUNTER — Encounter (HOSPITAL_COMMUNITY)
Admission: RE | Admit: 2023-11-22 | Discharge: 2023-11-22 | Disposition: A | Discharge status: Home / Self Care | Source: Ambulatory Visit | Attending: Cardiology

## 2023-11-22 DIAGNOSIS — Z48812 Encounter for surgical aftercare following surgery on the circulatory system: Secondary | ICD-10-CM | POA: Diagnosis not present

## 2023-11-22 DIAGNOSIS — Z951 Presence of aortocoronary bypass graft: Secondary | ICD-10-CM

## 2023-11-24 ENCOUNTER — Encounter (HOSPITAL_COMMUNITY)
Admission: RE | Admit: 2023-11-24 | Discharge: 2023-11-24 | Disposition: A | Discharge status: Home / Self Care | Source: Ambulatory Visit | Attending: Cardiology

## 2023-11-24 DIAGNOSIS — Z48812 Encounter for surgical aftercare following surgery on the circulatory system: Secondary | ICD-10-CM | POA: Diagnosis not present

## 2023-11-24 DIAGNOSIS — Z951 Presence of aortocoronary bypass graft: Secondary | ICD-10-CM

## 2023-11-26 ENCOUNTER — Encounter (HOSPITAL_COMMUNITY)
Admission: RE | Admit: 2023-11-26 | Discharge: 2023-11-26 | Disposition: A | Discharge status: Home / Self Care | Source: Ambulatory Visit | Attending: Cardiology | Admitting: Cardiology

## 2023-11-26 DIAGNOSIS — Z951 Presence of aortocoronary bypass graft: Secondary | ICD-10-CM

## 2023-11-26 DIAGNOSIS — Z48812 Encounter for surgical aftercare following surgery on the circulatory system: Secondary | ICD-10-CM | POA: Diagnosis not present

## 2023-11-29 ENCOUNTER — Encounter (HOSPITAL_COMMUNITY)
Admission: RE | Admit: 2023-11-29 | Discharge: 2023-11-29 | Disposition: A | Discharge status: Home / Self Care | Source: Ambulatory Visit | Attending: Cardiology | Admitting: Cardiology

## 2023-11-29 DIAGNOSIS — Z48812 Encounter for surgical aftercare following surgery on the circulatory system: Secondary | ICD-10-CM | POA: Diagnosis not present

## 2023-11-29 DIAGNOSIS — Z951 Presence of aortocoronary bypass graft: Secondary | ICD-10-CM

## 2023-12-01 ENCOUNTER — Encounter (HOSPITAL_COMMUNITY)
Admission: RE | Admit: 2023-12-01 | Discharge: 2023-12-01 | Disposition: A | Discharge status: Home / Self Care | Source: Ambulatory Visit | Attending: Cardiology | Admitting: Cardiology

## 2023-12-01 DIAGNOSIS — Z951 Presence of aortocoronary bypass graft: Secondary | ICD-10-CM | POA: Insufficient documentation

## 2023-12-06 ENCOUNTER — Encounter (HOSPITAL_COMMUNITY)
Admission: RE | Admit: 2023-12-06 | Discharge: 2023-12-06 | Disposition: A | Discharge status: Home / Self Care | Source: Ambulatory Visit | Attending: Cardiology

## 2023-12-06 DIAGNOSIS — Z951 Presence of aortocoronary bypass graft: Secondary | ICD-10-CM | POA: Diagnosis not present

## 2023-12-08 ENCOUNTER — Encounter (HOSPITAL_COMMUNITY)
Admission: RE | Admit: 2023-12-08 | Discharge: 2023-12-08 | Disposition: A | Discharge status: Home / Self Care | Source: Ambulatory Visit | Attending: Cardiology | Admitting: Cardiology

## 2023-12-08 DIAGNOSIS — Z951 Presence of aortocoronary bypass graft: Secondary | ICD-10-CM | POA: Diagnosis not present

## 2023-12-10 ENCOUNTER — Encounter (HOSPITAL_COMMUNITY)
Admission: RE | Admit: 2023-12-10 | Discharge: 2023-12-10 | Disposition: A | Discharge status: Home / Self Care | Source: Ambulatory Visit | Attending: Cardiology

## 2023-12-10 ENCOUNTER — Other Ambulatory Visit: Payer: Self-pay

## 2023-12-10 DIAGNOSIS — Z951 Presence of aortocoronary bypass graft: Secondary | ICD-10-CM

## 2023-12-10 DIAGNOSIS — R202 Paresthesia of skin: Secondary | ICD-10-CM

## 2023-12-13 ENCOUNTER — Encounter (HOSPITAL_COMMUNITY)
Admission: RE | Admit: 2023-12-13 | Discharge: 2023-12-13 | Disposition: A | Discharge status: Home / Self Care | Source: Ambulatory Visit | Attending: Cardiology | Admitting: Cardiology

## 2023-12-13 DIAGNOSIS — Z951 Presence of aortocoronary bypass graft: Secondary | ICD-10-CM | POA: Diagnosis not present

## 2023-12-14 NOTE — Progress Notes (Signed)
 Cardiac Individual Treatment Plan  Patient Details  Name: Kevin Yang MRN: [id number] Date of Birth: 18-May-1951 Referring Provider:   Flowsheet Row INTENSIVE CARDIAC REHAB ORIENT from 10/28/2023 in [facility]  Referring Provider Dr. Ripley (Dr. Wilbert Bihari covering)    Initial Encounter Date:  Flowsheet Row INTENSIVE CARDIAC REHAB ORIENT from 10/28/2023 in [facility]  Date 10/28/23    Visit Diagnosis: 08/23/23  S/P CABG x 4 at [facility]  Patient's Home Medications on Admission:  Current Outpatient Medications:    albuterol  (PROVENTIL  HFA;VENTOLIN  HFA) 108 (90 BASE) MCG/ACT inhaler, Inhale 2 puffs into the lungs every 6 (six) hours as needed for wheezing or shortness of breath. , Disp: , Rfl:    atorvastatin (LIPITOR) 80 MG tablet, TAKE ONE TABLET BY MOUTH AT BEDTIME FOR CHOLESTEROL, Disp: , Rfl:    Benzoyl Peroxide 10 % LIQD, WASH AS DIRECTED AFFECTED AREA DAILY : APPLY TO THE SCALP, CHEST AND BACK DAILY IN THE SHOWER. RINSE OFF COMPLETELY. DRY OFF WITH WHITE TOWELS SINCE THIS WASH CAN BLEACH COLORS, Disp: , Rfl:    budesonide-formoterol (SYMBICORT) 160-4.5 MCG/ACT inhaler, Inhale 2 puffs into the lungs 2 (two) times daily., Disp: , Rfl:    Carboxymethylcellulose Sodium 1 % GEL, APPLY 1 DROP TO EACH EYE FOUR TIMES A DAY, Disp: , Rfl:    cetirizine (ZYRTEC) 10 MG tablet, Take 1 tablet by mouth daily., Disp: , Rfl:    diclofenac Sodium (VOLTAREN) 1 % GEL, APPLY 4 GRAMS TO AFFECTED AREA FOUR TIMES A DAY AS NEEDED, Disp: , Rfl:    dicyclomine (BENTYL) 10 MG capsule, TAKE ONE CAPSULE BY MOUTH TWICE A DAY BEFORE MEALS, Disp: , Rfl:    fluticasone (FLONASE) 50 MCG/ACT nasal spray, Place 1 spray into both nostrils as needed. 2 sprays each nostril once a day, Disp: , Rfl:    formoterol (PERFOROMIST) 20 MCG/2ML nebulizer solution, Take 20 mcg by  nebulization 4 (four) times daily., Disp: , Rfl:    HYDROcodone-acetaminophen (NORCO) 10-325 MG tablet, TAKE ONE-HALF TO ONE TABLET BY MOUTH TWICE A DAY AS NEEDED (9/62M,10/78M), Disp: , Rfl:    inFLIXimab-abda 100 MG SOLR, Inject into the vein every 6 (six) weeks., Disp: , Rfl:    ipratropium-albuterol  (DUONEB) 0.5-2.5 (3) MG/3ML SOLN, Inhale 3 mLs into the lungs every 4 (four) hours as needed (for wheezing)., Disp: , Rfl:    lisinopril (ZESTRIL) 10 MG tablet, Take 1 tablet by mouth daily., Disp: , Rfl:    loperamide (IMODIUM) 2 MG capsule, TAKE ONE CAPSULE BY MOUTH AS DIRECTED BY YOUR PROVIDER AS NEEDED    AFTER EACH LOOSE STOOL NO MORE THAN 4 TIMES PER DAY, Disp: , Rfl:    naloxone (NARCAN) nasal spray 4 mg/0.1 mL, SPRAY 1 SPRAY INTO ONE NOSTRIL AS DIRECTED FOR OPIOID OVERDOSE (TURN PERSON ON SIDE AFTER DOSE. IF NO RESPONSE IN 2-3 MINUTES OR PERSON RESPONDS BUT RELAPSES, REPEAT USING A NEW SPRAY DEVICE AND SPRAY INTO THE OTHER NOSTRIL. CALL 911 AFTER USE.) * EMERGENCY USE ONLY *, Disp: , Rfl:    omeprazole (PRILOSEC) 40 MG capsule, TAKE ONE CAPSULE BY MOUTH DAILY (TAKE ON AN EMPTY STOMACH 30 MINUTES PRIOR TO A MEAL), Disp: , Rfl:    predniSONE  (DELTASONE ) 10 MG tablet, TAKE TWO TABLETS BY MOUTH IN THE MORNING -CONTINUE THE 20 MG DAILY UNTIL FURTHER INSTRUCTED (TAKE WITH FOOD), Disp: , Rfl:  sildenafil  (VIAGRA ) 50 MG tablet, Take 1 tablet (50 mg total) by mouth as needed for erectile dysfunction., Disp: 10 tablet, Rfl: 2   simvastatin (ZOCOR) 80 MG tablet, Take 80 mg by mouth at bedtime., Disp: , Rfl:    tamsulosin (FLOMAX) 0.4 MG CAPS, Take 0.4 mg by mouth at bedtime., Disp: , Rfl:   Past Medical History: Past Medical History:  Diagnosis Date   Asthma    Colitis    COPD (chronic obstructive pulmonary disease) (HCC)    Crohn's disease (HCC)     Tobacco Use: Social History   Tobacco Use  Smoking Status Former   Types: Cigars   Quit date: 09/07/1986   Years since quitting: 37.2   Smokeless Tobacco Never    Labs: Review Flowsheet       Latest Ref Rng & Units 04/22/2007 04/07/2009 04/08/2009 10/09/2015  Labs for ITP Cardiac and Pulmonary Rehab  Cholestrol 0 - 200 mg/dL 787  - 729        ATP III CLASSIFICATION:  <200     mg/dL   Desirable  799-760  mg/dL   Borderline High  >=759    mg/dL   High         717   LDL (calc) 0 - 99 mg/dL - - 793        Total Cholesterol/HDL:CHD Risk Coronary Heart Disease Risk Table                     Men   Women  1/2 Average Risk   3.4   3.3  Average Risk       5.0   4.4  2 X Average Risk   9.6   7.1  3 X Average Risk  23.4   11.0        Use the calculated Patient Ratio above and the CHD Risk Table to determine the patient's CHD Risk.        ATP III CLASSIFICATION (LDL):  <100     mg/dL   Optimal  899-870  mg/dL   Near or Above                    Optimal  130-159  mg/dL   Borderline  839-810  mg/dL   High  >809     mg/dL   Very High  810   Direct LDL mg/dL 853.4  - - -  HDL-C >60.99 mg/dL 62.6  - 40  43.09   Trlycerides 0.0 - 149.0 mg/dL 886  - 877  819.9   TCO2 0 - 100 mmol/L - 30  - -    Capillary Blood Glucose: No results found for: GLUCAP   Exercise Target Goals: Exercise Program Goal: Individual exercise prescription set using results from initial 6 min walk test and THRR while considering  patient's activity barriers and safety.   Exercise Prescription Goal: Initial exercise prescription builds to 30-45 minutes a day of aerobic activity, 2-3 days per week.  Home exercise guidelines will be given to patient during program as part of exercise prescription that the participant will acknowledge.  Activity Barriers & Risk Stratification:  Activity Barriers & Cardiac Risk Stratification - 10/28/23 1336       Activity Barriers & Cardiac Risk Stratification   Activity Barriers Back Problems;Shortness of Breath;Balance Concerns    Cardiac Risk Stratification High          6 Minute Walk:  6 Minute Walk  Row Name 10/28/23 1335         6 Minute Walk   Phase Initial     Distance 1405 feet     Walk Time 6 minutes     # of Rest Breaks 0     MPH 2.66     METS 2.99     RPE 12     Perceived Dyspnea  2     VO2 Peak 10.46     Symptoms Yes (comment)     Comments Chronic low back pain 4/10     Resting HR 73 bpm     Resting BP 148/80     Resting Oxygen Saturation  95 %     Exercise Oxygen Saturation  during 6 min walk 93 %     Max Ex. HR 92 bpm     Max Ex. BP 168/88     2 Minute Post BP 158/78        Oxygen Initial Assessment:   Oxygen Re-Evaluation:   Oxygen Discharge (Final Oxygen Re-Evaluation):   Initial Exercise Prescription:  Initial Exercise Prescription - 10/28/23 1300       Date of Initial Exercise RX and Referring Provider   Date 10/28/23    Referring Provider Dr. Ripley (Dr. Wilbert Bihari covering)    Expected Discharge Date 01/19/24      Treadmill   MPH 2.5    Grade 0    Minutes 15    METs 2.91      Recumbant Bike   Level 2    RPM 35    Watts 50    Minutes 15    METs 2.1      Prescription Details   Frequency (times per week) 3    Duration Progress to 30 minutes of continuous aerobic without signs/symptoms of physical distress      Intensity   THRR 40-80% of Max Heartrate 60-119    Ratings of Perceived Exertion 11-13    Perceived Dyspnea 0-4      Progression   Progression Continue progressive overload as per policy without signs/symptoms or physical distress.      Resistance Training   Training Prescription Yes    Weight 3    Reps 10-15          Perform Capillary Blood Glucose checks as needed.  Exercise Prescription Changes:   Exercise Prescription Changes     Row Name 11/01/23 0830 11/15/23 0924 12/06/23 0849         Response to Exercise   Blood Pressure (Admit) 146/82 150/80 122/64     Blood Pressure (Exercise) 158/82 152/70 --     Blood Pressure (Exit) 132/74 130/84 138/70     Heart Rate (Admit) 73 bpm 74 bpm 74 bpm      Heart Rate (Exercise) 97 bpm 104 bpm 93 bpm     Heart Rate (Exit) 83 bpm 79 bpm 72 bpm     Rating of Perceived Exertion (Exercise) 13 12.5 12     Perceived Dyspnea (Exercise) 1 1 1      Symptoms none none none     Comments Pt first day in the [employer] program Reviewed MET's, goals and home ExRx Reviewed MET's     Duration Progress to 30 minutes of  aerobic without signs/symptoms of physical distress Progress to 30 minutes of  aerobic without signs/symptoms of physical distress Progress to 30 minutes of  aerobic without signs/symptoms of physical distress     Intensity THRR unchanged THRR unchanged  THRR unchanged       Progression   Progression Continue to progress workloads to maintain intensity without signs/symptoms of physical distress. Continue to progress workloads to maintain intensity without signs/symptoms of physical distress. Continue to progress workloads to maintain intensity without signs/symptoms of physical distress.     Average METs 2.46 2.56 2.61       Resistance Training   Training Prescription Yes Yes Yes     Weight 3 3 3      Reps 10-15 10-15 10-15     Time 10 Minutes 10 Minutes 10 Minutes       Treadmill   MPH 2.5 2.5 2.5     Grade 0 0 0     Minutes 15 15 15      METs 2.91 2.91 2.91       Recumbant Bike   Level 2 2 2      RPM 67 84 80     Watts 14 21 27      Minutes 15 15 15      METs 2 2.2 2.3       Home Exercise Plan   Plans to continue exercise at -- Home (comment) Home (comment)     Frequency -- Add 2 additional days to program exercise sessions. Add 2 additional days to program exercise sessions.     Initial Home Exercises Provided -- 11/15/23 11/15/23        Exercise Comments:   Exercise Comments     Row Name 11/01/23 1652 11/15/23 0934 12/06/23 0853       Exercise Comments Pt first day in the [facility] program. Pt tolerated exercise well with an average MET level of 2.46. Pt is leanring his THRR, RPE and ExRx. He's off to a great start. Pt  will continue to exercise and increase WL as tolerated Reviewed MET's, goals and home ExRx. Pt tolerated exercise well with an average MET level of 2.56. Pt is feeling good about his goals and is already feeling an increase in strength and a decrease in SOB. He will continue to exercise by walking and using his TM at home 2 days for 30 mins. Reviewed MET's. Pt tolerated exercise well with an average MET level of 2.61. Pt is feeling good with exercise, encouraged increases in WL to increase MET's and strength, he feels good about this, plans to increase on next visit        Exercise Goals and Review:   Exercise Goals     Row Name 10/28/23 1343             Exercise Goals   Increase Physical Activity Yes       Intervention Provide advice, education, support and counseling about physical activity/exercise needs.;Develop an individualized exercise prescription for aerobic and resistive training based on initial evaluation findings, risk stratification, comorbidities and participant's personal goals.       Expected Outcomes Short Term: Attend rehab on a regular basis to increase amount of physical activity.;Long Term: Add in home exercise to make exercise part of routine and to increase amount of physical activity.;Long Term: Exercising regularly at least 3-5 days a week.       Increase Strength and Stamina Yes       Intervention Provide advice, education, support and counseling about physical activity/exercise needs.;Develop an individualized exercise prescription for aerobic and resistive training based on initial evaluation findings, risk stratification, comorbidities and participant's personal goals.       Expected Outcomes Short Term: Increase workloads from initial  exercise prescription for resistance, speed, and METs.;Short Term: Perform resistance training exercises routinely during rehab and add in resistance training at home;Long Term: Improve cardiorespiratory fitness, muscular endurance and  strength as measured by increased METs and functional capacity ( )       Able to understand and use rate of perceived exertion (RPE) scale Yes       Intervention Provide education and explanation on how to use RPE scale       Expected Outcomes Short Term: Able to use RPE daily in rehab to express subjective intensity level;Long Term:  Able to use RPE to guide intensity level when exercising independently       Able to understand and use Dyspnea scale Yes       Intervention Provide education and explanation on how to use Dyspnea scale       Expected Outcomes Short Term: Able to use Dyspnea scale daily in rehab to express subjective sense of shortness of breath during exertion;Long Term: Able to use Dyspnea scale to guide intensity level when exercising independently       Knowledge and understanding of Target Heart Rate Range (THRR) Yes       Intervention Provide education and explanation of THRR including how the numbers were predicted and where they are located for reference       Expected Outcomes Short Term: Able to state/look up THRR;Short Term: Able to use daily as guideline for intensity in rehab;Long Term: Able to use THRR to govern intensity when exercising independently       Understanding of Exercise Prescription Yes       Intervention Provide education, explanation, and written materials on patient's individual exercise prescription       Expected Outcomes Short Term: Able to explain program exercise prescription;Long Term: Able to explain home exercise prescription to exercise independently          Exercise Goals Re-Evaluation :  Exercise Goals Re-Evaluation     Row Name 11/01/23 0830 11/15/23 0930           Exercise Goal Re-Evaluation   Exercise Goals Review Increase Physical Activity;Understanding of Exercise Prescription;Increase Strength and Stamina;Knowledge and understanding of Target Heart Rate Range (THRR);Able to understand and use rate of perceived exertion (RPE)  scale Increase Physical Activity;Understanding of Exercise Prescription;Increase Strength and Stamina;Knowledge and understanding of Target Heart Rate Range (THRR);Able to understand and use rate of perceived exertion (RPE) scale      Comments Pt first day in the [facility] program. Pt tolerated exercise well with an average MET level of 2.46. Pt is leanring his THRR, RPE and ExRx. He's off to a great start Reviewed MET's, goals and home ExRx. Pt tolerated exercise well with an average MET level of 2.56. Pt is feeling good about his goals and is already feeling an increase in strength and a decrease in SOB. He will continue to exercise by walking and using his TM at home 2 days for 30 mins.      Expected Outcomes Pt will continue to exercise and increase WL as tolerated Pt will continue to exercise and increase WL as tolerated         Discharge Exercise Prescription (Final Exercise Prescription Changes):  Exercise Prescription Changes - 12/06/23 0849       Response to Exercise   Blood Pressure (Admit) 122/64    Blood Pressure (Exit) 138/70    Heart Rate (Admit) 74 bpm    Heart Rate (Exercise) 93 bpm  Heart Rate (Exit) 72 bpm    Rating of Perceived Exertion (Exercise) 12    Perceived Dyspnea (Exercise) 1    Symptoms none    Comments Reviewed MET's    Duration Progress to 30 minutes of  aerobic without signs/symptoms of physical distress    Intensity THRR unchanged      Progression   Progression Continue to progress workloads to maintain intensity without signs/symptoms of physical distress.    Average METs 2.61      Resistance Training   Training Prescription Yes    Weight 3    Reps 10-15    Time 10 Minutes      Treadmill   MPH 2.5    Grade 0    Minutes 15    METs 2.91      Recumbant Bike   Level 2    RPM 80    Watts 27    Minutes 15    METs 2.3      Home Exercise Plan   Plans to continue exercise at Home (comment)    Frequency Add 2 additional days to program  exercise sessions.    Initial Home Exercises Provided 11/15/23          Nutrition:  Target Goals: Understanding of nutrition guidelines, daily intake of sodium 1500mg , cholesterol 200mg , calories 30% from fat and 7% or less from saturated fats, daily to have 5 or more servings of fruits and vegetables.  Biometrics:  Pre Biometrics - 10/28/23 1334       Pre Biometrics   Height 5' 9.25 (1.759 m)    Waist Circumference 45 inches    Hip Circumference 41 inches    Waist to Hip Ratio 1.1 %    BMI (Calculated) 31.61    Triceps Skinfold 15 mm    % Body Fat 31.3 %    Grip Strength 21 kg    Flexibility 10.5 in    Single Leg Stand 22 seconds           Nutrition Therapy Plan and Nutrition Goals:  Nutrition Therapy & Goals - 12/01/23 0849       Nutrition Therapy   Diet Heart Healthy Diet    Drug/Food Interactions Statins/Certain Fruits      Personal Nutrition Goals   Nutrition Goal Patient to identify strategies for reducing cardiovascular risk by attending the [facility] education and nutrition series weekly.   Goal in progress.   Personal Goal #2 Patient to improve diet quality by using the plate method as a guide for meal planning to include lean protein/plant protein, fruits, vegetables, whole grains, nonfat dairy as part of a well-balanced diet.   Goal in progress.   Comments Goal in progress. Patient has medical history of CAD, COPD, Crohn's disease, diverticulitis, DM2, HTN, OSA, CABGx4. He has previously completed pulmonary rehab in 2022 and 2019. LDL is not at goal. A1c is well controlled at this time. He is down 2.6# since starting with our program. He does continue to attend the [facility] education/nutrition series regularly.  Patient will continue to benefit from participation in intensive cardiac rehab for nutrition education, exercise, and lifestyle modification.      Intervention Plan   Intervention Prescribe, educate and counsel regarding individualized specific  dietary modifications aiming towards targeted core components such as weight, hypertension, lipid management, diabetes, heart failure and other comorbidities.;Nutrition handout(s) given to patient.    Expected Outcomes Short Term Goal: Understand basic principles of dietary content, such as calories, fat, sodium, cholesterol  and nutrients.;Long Term Goal: Adherence to prescribed nutrition plan.          Nutrition Assessments:  MEDIFICTS Score Key: >=70 Need to make dietary changes  40-70 Heart Healthy Diet <= 40 Therapeutic Level Cholesterol Diet   Flowsheet Row INTENSIVE CARDIAC REHAB from 11/22/2023 in [facility]  Picture Your Plate Total Score on Admission 51   Picture Your Plate Scores: <59 Unhealthy dietary pattern with much room for improvement. 41-50 Dietary pattern unlikely to meet recommendations for good health and room for improvement. 51-60 More healthful dietary pattern, with some room for improvement.  >60 Healthy dietary pattern, although there may be some specific behaviors that could be improved.    Nutrition Goals Re-Evaluation:  Nutrition Goals Re-Evaluation     Row Name 11/01/23 1401 12/01/23 0849           Goals   Current Weight 214 lb 15.2 oz (97.5 kg) 212 lb 15.4 oz (96.6 kg)      Comment total cholesterol 198, triglycerides 149, HDL 40, LDL 142, A1c 6.6 no new labs; most recent labs  total cholesterol 198, triglycerides 149, HDL 40, LDL 142, A1c 6.6      Expected Outcome Patient has medical history of CAD, COPD, Crohn's disease, diverticulitis, DM2, HTN, OSA, CABGx4. He has previously completed pulmonary rehab in 2022 and 2019. LDL is not at goal. A1c is well controlled at this time. Patient will benefit from participation in intensive cardiac rehab for nutrition education, exercise, and lifestyle modification. Goal in progress. Patient has medical history of CAD, COPD, Crohn's disease,  diverticulitis, DM2, HTN, OSA, CABGx4. He has previously completed pulmonary rehab in 2022 and 2019. LDL is not at goal. A1c is well controlled at this time. He is down 2.6# since starting with our program. He does continue to attend the [facility] education/nutrition series regularly. Patient will continue to benefit from participation in intensive cardiac rehab for nutrition education, exercise, and lifestyle modification.         Nutrition Goals Re-Evaluation:  Nutrition Goals Re-Evaluation     Row Name 11/01/23 1401 12/01/23 0849           Goals   Current Weight 214 lb 15.2 oz (97.5 kg) 212 lb 15.4 oz (96.6 kg)      Comment total cholesterol 198, triglycerides 149, HDL 40, LDL 142, A1c 6.6 no new labs; most recent labs  total cholesterol 198, triglycerides 149, HDL 40, LDL 142, A1c 6.6      Expected Outcome Patient has medical history of CAD, COPD, Crohn's disease, diverticulitis, DM2, HTN, OSA, CABGx4. He has previously completed pulmonary rehab in 2022 and 2019. LDL is not at goal. A1c is well controlled at this time. Patient will benefit from participation in intensive cardiac rehab for nutrition education, exercise, and lifestyle modification. Goal in progress. Patient has medical history of CAD, COPD, Crohn's disease, diverticulitis, DM2, HTN, OSA, CABGx4. He has previously completed pulmonary rehab in 2022 and 2019. LDL is not at goal. A1c is well controlled at this time. He is down 2.6# since starting with our program. He does continue to attend the [facility] education/nutrition series regularly. Patient will continue to benefit from participation in intensive cardiac rehab for nutrition education, exercise, and lifestyle modification.         Nutrition Goals Discharge (Final Nutrition Goals Re-Evaluation):  Nutrition Goals Re-Evaluation - 12/01/23 0849       Goals   Current Weight 212  lb 15.4 oz (96.6 kg)    Comment no new labs; most recent labs  total cholesterol 198, triglycerides  149, HDL 40, LDL 142, A1c 6.6    Expected Outcome Goal in progress. Patient has medical history of CAD, COPD, Crohn's disease, diverticulitis, DM2, HTN, OSA, CABGx4. He has previously completed pulmonary rehab in 2022 and 2019. LDL is not at goal. A1c is well controlled at this time. He is down 2.6# since starting with our program. He does continue to attend the [facility] education/nutrition series regularly. Patient will continue to benefit from participation in intensive cardiac rehab for nutrition education, exercise, and lifestyle modification.          Psychosocial: Target Goals: Acknowledge presence or absence of significant depression and/or stress, maximize coping skills, provide positive support system. Participant is able to verbalize types and ability to use techniques and skills needed for reducing stress and depression.  Initial Review & Psychosocial Screening:  Initial Psych Review & Screening - 10/28/23 1235       Initial Review   Current issues with None Identified      Family Dynamics   Good Support System? Yes   Kevin Yang has his wife, son and friends for support     Barriers   Psychosocial barriers to participate in program The patient should benefit from training in stress management and relaxation.      Screening Interventions   Interventions Encouraged to exercise          Quality of Life Scores:  Quality of Life - 10/28/23 1346       Quality of Life   Select Quality of Life      Quality of Life Scores   Health/Function Pre 22.5 %    Socioeconomic Pre 20.42 %    Psych/Spiritual Pre 24.86 %    Family Pre 24.2 %    GLOBAL Pre 22.88 %         Scores of 19 and below usually indicate a poorer quality of life in these areas.  A difference of  2-3 points is a clinically meaningful difference.  A difference of 2-3 points in the total score of the Quality of Life Index has been associated with significant improvement in overall quality of life, self-image,  physical symptoms, and general health in studies assessing change in quality of life.  PHQ-9: Review Flowsheet  More data exists      10/28/2023 06/12/2021 05/05/2021 04/07/2021 03/24/2021  Depression screen PHQ 2/9  Decreased Interest 3 0 2 1 3   Down, Depressed, Hopeless 0 0 0 1 2  PHQ - 2 Score 3 0 2 2 5   Altered sleeping 3 2 0 2 3  Tired, decreased energy 2 2 1 3 3   Change in appetite 2 1 0 0 1  Feeling bad or failure about yourself  0 0 0 0 0  Trouble concentrating 1 0 0 0 1  Moving slowly or fidgety/restless 0 0 0 0 0  Suicidal thoughts 0 0 0 0 0  PHQ-9 Score 11 5 3 7 13   Difficult doing work/chores Somewhat difficult Somewhat difficult Somewhat difficult Somewhat difficult Very difficult   Interpretation of Total Score  Total Score Depression Severity:  1-4 = Minimal depression, 5-9 = Mild depression, 10-14 = Moderate depression, 15-19 = Moderately severe depression, 20-27 = Severe depression   Psychosocial Evaluation and Intervention:   Psychosocial Re-Evaluation:  Psychosocial Re-Evaluation     Row Name 11/11/23 (217) [phone] 12/07/23 1138  Psychosocial Re-Evaluation   Current issues with None Identified None Identified      Comments PHQ9 score reviewed by Kevin Byes RN M[facility]. Patient  denies feeling down or depressed. 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 He has no needs for a referral for counseling at this time and will let us  know if that changes for him Kevin Yang has not voiced any increased concerns or stressors during exercise at cardiac rehab.      Interventions Encouraged to attend Cardiac Rehabilitation for the exercise Encouraged to attend Cardiac Rehabilitation for the exercise      Continue Psychosocial Services  No Follow up required No Follow up required         Psychosocial Discharge (Final Psychosocial Re-Evaluation):  Psychosocial Re-Evaluation - 12/07/23 1138       Psychosocial Re-Evaluation   Current issues  with None Identified    Comments Kevin Yang has not voiced any increased concerns or stressors during exercise at cardiac rehab.    Interventions Encouraged to attend Cardiac Rehabilitation for the exercise    Continue Psychosocial Services  No Follow up required          Vocational Rehabilitation: Provide vocational rehab assistance to qualifying candidates.   Vocational Rehab Evaluation & Intervention:  Vocational Rehab - 10/28/23 1236       Initial Vocational Rehab Evaluation & Intervention   Assessment shows need for Vocational Rehabilitation --   Kevin Yang is retired and does not need vocational rehab at this time         Education: Education Goals: Education classes will be provided on a weekly basis, covering required topics. Participant will state understanding/return demonstration of topics presented.    Education     Row Name 11/03/23 0900     Education   Cardiac Education Topics Pritikin   Secondary school teacher School   Educator Dietitian   Weekly Topic Efficiency Cooking - Meals in a Snap   Instruction Review Code 1- Verbalizes Understanding   Class Start Time 0815   Class Stop Time 0850   Class Time Calculation (min) 35 min    Row Name 11/05/23 0900     Education   Cardiac Education Topics Pritikin   Select Core Videos     Core Videos   Educator Exercise Physiologist   Select Exercise Education   Exercise Education Move It!   Instruction Review Code 1- Verbalizes Understanding   Class Start Time [phone]   Class Stop Time 0845   Class Time Calculation (min) 35 min    Row Name 11/08/23 0800     Education   Cardiac Education Topics Pritikin   Glass blower/designer Nutrition   Nutrition Workshop Targeting Your Nutrition Priorities   Instruction Review Code 1- Verbalizes Understanding   Class Start Time 0815   Class Stop Time 0855   Class Time Calculation (min) 40 min    Row Name 11/10/23 0800      Education   Cardiac Education Topics Pritikin   Secondary school teacher School   Educator Dietitian   Weekly Topic One-Pot Wonders   Instruction Review Code 1- Verbalizes Understanding   Class Start Time 0815   Class Stop Time 0850   Class Time Calculation (min) 35 min    Row Name 11/12/23 0800     Education   Cardiac Education Topics Pritikin  Select Core Videos     Core Videos   Educator Exercise Physiologist   Select General Education   General Education Hypertension and Heart Disease   Instruction Review Code 1- Verbalizes Understanding   Class Start Time [phone]   Class Stop Time 0847   Class Time Calculation (min) 35 min    Row Name 11/15/23 0800     Education   Cardiac Education Topics Pritikin   Select Workshops     Workshops   Educator Exercise Physiologist   Select Psychosocial   Psychosocial Workshop Focused Goals, Sustainable Changes   Instruction Review Code 1- Verbalizes Understanding   Class Start Time 0815   Class Stop Time 0850   Class Time Calculation (min) 35 min    Row Name 11/17/23 0900     Education   Cardiac Education Topics Pritikin   Orthoptist   Educator Dietitian   Weekly Topic Comforting Weekend Breakfasts   Instruction Review Code 1- Verbalizes Understanding   Class Start Time [phone]   Class Stop Time 0845   Class Time Calculation (min) 35 min    Row Name 11/19/23 0800     Education   Cardiac Education Topics Pritikin   Select Core Videos     Core Videos   Educator Exercise Physiologist   Select Nutrition   Instruction Review Code 1- Verbalizes Understanding   Class Start Time 0820   Class Stop Time 0855   Class Time Calculation (min) 35 min    Row Name 11/22/23 0900     Education   Cardiac Education Topics Pritikin   Select Core Videos     Core Videos   Educator Exercise Physiologist   Select Exercise Education   Exercise Education Biomechanial Limitations   Instruction  Review Code 1- Verbalizes Understanding   Class Start Time 0815   Class Stop Time 0847   Class Time Calculation (min) 32 min    Row Name 11/24/23 1000     Education   Cardiac Education Topics Pritikin   Secondary school teacher School   Educator Dietitian   Weekly Topic Fast Evening Meals   Instruction Review Code 1- Verbalizes Understanding   Class Start Time 0815   Class Stop Time 0856   Class Time Calculation (min) 41 min    Row Name 11/26/23 0800     Education   Cardiac Education Topics Pritikin   Select Core Videos     Core Videos   Educator Dietitian   Select Nutrition   Nutrition Vitamins and Minerals   Instruction Review Code 1- Verbalizes Understanding   Class Start Time 0815   Class Stop Time 0905   Class Time Calculation (min) 50 min    Row Name 12/01/23 0900     Education   Cardiac Education Topics Pritikin   Customer service manager   Weekly Topic International Cuisine- Spotlight on the [facility] Zones   Instruction Review Code 1- Verbalizes Understanding   Class Start Time 0815   Class Stop Time 0850   Class Time Calculation (min) 35 min    Row Name 12/06/23 0800     Education   Cardiac Education Topics Pritikin   Geographical information systems officer Psychosocial   Psychosocial Workshop Healthy Sleep for a Healthy Heart   Instruction Review Code 1- [employer] Understanding  Class Start Time 0815   Class Stop Time 0900   Class Time Calculation (min) 45 min    Row Name 12/08/23 0900     Education   Cardiac Education Topics Pritikin   Secondary school teacher School   Educator Dietitian   Weekly Topic Simple Sides and Sauces   Instruction Review Code 1- Verbalizes Understanding   Class Start Time 0815   Class Stop Time 0846   Class Time Calculation (min) 31 min    Row Name 12/10/23 0900     Education   Cardiac Education Topics Pritikin    Select Core Videos     Core Videos   Educator Exercise Physiologist   Select Psychosocial   Psychosocial How Our Thoughts Can Heal Our Hearts   Instruction Review Code 1- Verbalizes Understanding   Class Start Time 0815   Class Stop Time 0847   Class Time Calculation (min) 32 min    Row Name 12/13/23 0800     Education   Cardiac Education Topics Pritikin   Select Core Videos     Core Videos   Educator Exercise Physiologist   Select General Education   General Education Hypertension and Heart Disease   Instruction Review Code 1- Verbalizes Understanding   Class Start Time 0815   Class Stop Time 0851   Class Time Calculation (min) 36 min      Core Videos: Exercise    Move It!  Clinical staff conducted group or individual video education with verbal and written material and guidebook.  Patient learns the recommended Pritikin exercise program. Exercise with the goal of living a long, healthy life. Some of the health benefits of exercise include controlled diabetes, healthier blood pressure levels, improved cholesterol levels, improved heart and lung capacity, improved sleep, and better body composition. Everyone should speak with their doctor before starting or changing an exercise routine.  Biomechanical Limitations Clinical staff conducted group or individual video education with verbal and written material and guidebook.  Patient learns how biomechanical limitations can impact exercise and how we can mitigate and possibly overcome limitations to have an impactful and balanced exercise routine.  Body Composition Clinical staff conducted group or individual video education with verbal and written material and guidebook.  Patient learns that body composition (ratio of muscle mass to fat mass) is a key component to assessing overall fitness, rather than body weight alone. Increased fat mass, especially visceral belly fat, can put us  at increased risk for metabolic syndrome, type 2  diabetes, heart disease, and even death. It is recommended to combine diet and exercise (cardiovascular and resistance training) to improve your body composition. Seek guidance from your physician and exercise physiologist before implementing an exercise routine.  Exercise Action Plan Clinical staff conducted group or individual video education with verbal and written material and guidebook.  Patient learns the recommended strategies to achieve and enjoy long-term exercise adherence, including variety, self-motivation, self-efficacy, and positive decision making. Benefits of exercise include fitness, good health, weight management, more energy, better sleep, less stress, and overall well-being.  Medical   Heart Disease Risk Reduction Clinical staff conducted group or individual video education with verbal and written material and guidebook.  Patient learns our heart is our most vital organ as it circulates oxygen, nutrients, white blood cells, and hormones throughout the entire body, and carries waste away. Data supports a plant-based eating plan like the Pritikin Program for its effectiveness in slowing progression of and reversing heart disease.  The video provides a number of recommendations to address heart disease.   Metabolic Syndrome and Belly Fat  Clinical staff conducted group or individual video education with verbal and written material and guidebook.  Patient learns what metabolic syndrome is, how it leads to heart disease, and how one can reverse it and keep it from coming back. You have metabolic syndrome if you have 3 of the following 5 criteria: abdominal obesity, high blood pressure, high triglycerides, low HDL cholesterol, and high blood sugar.  Hypertension and Heart Disease Clinical staff conducted group or individual video education with verbal and written material and guidebook.  Patient learns that high blood pressure, or hypertension, is very common in the [place] .  Hypertension is largely due to excessive salt intake, but other important risk factors include being overweight, physical inactivity, drinking too much alcohol, smoking, and not eating enough potassium from fruits and vegetables. High blood pressure is a leading risk factor for heart attack, stroke, congestive heart failure, dementia, kidney failure, and premature death. Long-term effects of excessive salt intake include stiffening of the arteries and thickening of heart muscle and organ damage. Recommendations include ways to reduce hypertension and the risk of heart disease.  Diseases of Our Time - Focusing on Diabetes Clinical staff conducted group or individual video education with verbal and written material and guidebook.  Patient learns why the best way to stop diseases of our time is prevention, through food and other lifestyle changes. Medicine (such as prescription pills and surgeries) is often only a Band-Aid on the problem, not a long-term solution. Most common diseases of our time include obesity, type 2 diabetes, hypertension, heart disease, and cancer. The Pritikin Program is recommended and has been proven to help reduce, reverse, and/or prevent the damaging effects of metabolic syndrome.  Nutrition   Overview of the Pritikin Eating Plan  Clinical staff conducted group or individual video education with verbal and written material and guidebook.  Patient learns about the Pritikin Eating Plan for disease risk reduction. The Pritikin Eating Plan emphasizes a wide variety of unrefined, minimally-processed carbohydrates, like fruits, vegetables, whole grains, and legumes. Go, Caution, and Stop food choices are explained. Plant-based and lean animal proteins are emphasized. Rationale provided for low sodium intake for blood pressure control, low added sugars for blood sugar stabilization, and low added fats and oils for coronary artery disease risk reduction and weight management.  Calorie  Density  Clinical staff conducted group or individual video education with verbal and written material and guidebook.  Patient learns about calorie density and how it impacts the Pritikin Eating Plan. Knowing the characteristics of the food you choose will help you decide whether those foods will lead to weight gain or weight loss, and whether you want to consume more or less of them. Weight loss is usually a side effect of the Pritikin Eating Plan because of its focus on low calorie-dense foods.  Label Reading  Clinical staff conducted group or individual video education with verbal and written material and guidebook.  Patient learns about the Pritikin recommended label reading guidelines and corresponding recommendations regarding calorie density, added sugars, sodium content, and whole grains.  Dining Out - Part 1  Clinical staff conducted group or individual video education with verbal and written material and guidebook.  Patient learns that restaurant meals can be sabotaging because they can be so high in calories, fat, sodium, and/or sugar. Patient learns recommended strategies on how to positively address this and avoid unhealthy pitfalls.  Facts on Fats  Clinical staff conducted group or individual video education with verbal and written material and guidebook.  Patient learns that lifestyle modifications can be just as effective, if not more so, as many medications for lowering your risk of heart disease. A Pritikin lifestyle can help to reduce your risk of inflammation and atherosclerosis (cholesterol build-up, or plaque, in the artery walls). Lifestyle interventions such as dietary choices and physical activity address the cause of atherosclerosis. A review of the types of fats and their impact on blood cholesterol levels, along with dietary recommendations to reduce fat intake is also included.  Nutrition Action Plan  Clinical staff conducted group or individual video education with  verbal and written material and guidebook.  Patient learns how to incorporate Pritikin recommendations into their lifestyle. Recommendations include planning and keeping personal health goals in mind as an important part of their success.  Healthy Mind-Set    Healthy Minds, Bodies, Hearts  Clinical staff conducted group or individual video education with verbal and written material and guidebook.  Patient learns how to identify when they are stressed. Video will discuss the impact of that stress, as well as the many benefits of stress management. Patient will also be introduced to stress management techniques. The way we think, act, and feel has an impact on our hearts.  How Our Thoughts Can Heal Our Hearts  Clinical staff conducted group or individual video education with verbal and written material and guidebook.  Patient learns that negative thoughts can cause depression and anxiety. This can result in negative lifestyle behavior and serious health problems. Cognitive behavioral therapy is an effective method to help control our thoughts in order to change and improve our emotional outlook.  Additional Videos:  Exercise    Improving Performance  Clinical staff conducted group or individual video education with verbal and written material and guidebook.  Patient learns to use a non-linear approach by alternating intensity levels and lengths of time spent exercising to help burn more calories and lose more body fat. Cardiovascular exercise helps improve heart health, metabolism, hormonal balance, blood sugar control, and recovery from fatigue. Resistance training improves strength, endurance, balance, coordination, reaction time, metabolism, and muscle mass. Flexibility exercise improves circulation, posture, and balance. Seek guidance from your physician and exercise physiologist before implementing an exercise routine and learn your capabilities and proper form for all exercise.  Introduction  to Yoga  Clinical staff conducted group or individual video education with verbal and written material and guidebook.  Patient learns about yoga, a discipline of the coming together of mind, breath, and body. The benefits of yoga include improved flexibility, improved range of motion, better posture and core strength, increased lung function, weight loss, and positive self-image. Yoga's heart health benefits include lowered blood pressure, healthier heart rate, decreased cholesterol and triglyceride levels, improved immune function, and reduced stress. Seek guidance from your physician and exercise physiologist before implementing an exercise routine and learn your capabilities and proper form for all exercise.  Medical   Aging: Enhancing Your Quality of Life  Clinical staff conducted group or individual video education with verbal and written material and guidebook.  Patient learns key strategies and recommendations to stay in good physical health and enhance quality of life, such as prevention strategies, having an advocate, securing a Health Care Proxy and Power of Attorney, and keeping a list of medications and system for tracking them. It also discusses how to avoid risk for bone loss.  Biology of Weight Control  Clinical staff conducted group or individual video education with verbal and written material and guidebook.  Patient learns that weight gain occurs because we consume more calories than we burn (eating more, moving less). Even if your body weight is normal, you may have higher ratios of fat compared to muscle mass. Too much body fat puts you at increased risk for cardiovascular disease, heart attack, stroke, type 2 diabetes, and obesity-related cancers. In addition to exercise, following the Pritikin Eating Plan can help reduce your risk.  Decoding Lab Results  Clinical staff conducted group or individual video education with verbal and written material and guidebook.  Patient learns  that lab test reflects one measurement whose values change over time and are influenced by many factors, including medication, stress, sleep, exercise, food, hydration, pre-existing medical conditions, and more. It is recommended to use the knowledge from this video to become more involved with your lab results and evaluate your numbers to speak with your doctor.   Diseases of Our Time - Overview  Clinical staff conducted group or individual video education with verbal and written material and guidebook.  Patient learns that according to the CDC, 50% to 70% of chronic diseases (such as obesity, type 2 diabetes, elevated lipids, hypertension, and heart disease) are avoidable through lifestyle improvements including healthier food choices, listening to satiety cues, and increased physical activity.  Sleep Disorders Clinical staff conducted group or individual video education with verbal and written material and guidebook.  Patient learns how good quality and duration of sleep are important to overall health and well-being. Patient also learns about sleep disorders and how they impact health along with recommendations to address them, including discussing with a physician.  Nutrition  Dining Out - Part 2 Clinical staff conducted group or individual video education with verbal and written material and guidebook.  Patient learns how to plan ahead and communicate in order to maximize their dining experience in a healthy and nutritious manner. Included are recommended food choices based on the type of restaurant the patient is visiting.   Fueling a Banker conducted group or individual video education with verbal and written material and guidebook.  There is a strong connection between our food choices and our health. Diseases like obesity and type 2 diabetes are very prevalent and are in large-part due to lifestyle choices. The Pritikin Eating Plan provides plenty of food and  hunger-curbing satisfaction. It is easy to follow, affordable, and helps reduce health risks.  Menu Workshop  Clinical staff conducted group or individual video education with verbal and written material and guidebook.  Patient learns that restaurant meals can sabotage health goals because they are often packed with calories, fat, sodium, and sugar. Recommendations include strategies to plan ahead and to communicate with the manager, chef, or server to help order a healthier meal.  Planning Your Eating Strategy  Clinical staff conducted group or individual video education with verbal and written material and guidebook.  Patient learns about the Pritikin Eating Plan and its benefit of reducing the risk of disease. The Pritikin Eating Plan does not focus on calories. Instead, it emphasizes high-quality, nutrient-rich foods. By knowing the characteristics of the foods, we choose, we can determine their calorie density and make informed decisions.  Targeting Your Nutrition Priorities  Clinical staff conducted group or individual video education with verbal and written material and guidebook.  Patient learns that lifestyle habits have a tremendous impact on disease risk and progression. This video provides eating  and physical activity recommendations based on your personal health goals, such as reducing LDL cholesterol, losing weight, preventing or controlling type 2 diabetes, and reducing high blood pressure.  Vitamins and Minerals  Clinical staff conducted group or individual video education with verbal and written material and guidebook.  Patient learns different ways to obtain key vitamins and minerals, including through a recommended healthy diet. It is important to discuss all supplements you take with your doctor.   Healthy Mind-Set    Smoking Cessation  Clinical staff conducted group or individual video education with verbal and written material and guidebook.  Patient learns that cigarette  smoking and tobacco addiction pose a serious health risk which affects millions of people. Stopping smoking will significantly reduce the risk of heart disease, lung disease, and many forms of cancer. Recommended strategies for quitting are covered, including working with your doctor to develop a successful plan.  Culinary   Becoming a Set designer conducted group or individual video education with verbal and written material and guidebook.  Patient learns that cooking at home can be healthy, cost-effective, quick, and puts them in control. Keys to cooking healthy recipes will include looking at your recipe, assessing your equipment needs, planning ahead, making it simple, choosing cost-effective seasonal ingredients, and limiting the use of added fats, salts, and sugars.  Cooking - Breakfast and Snacks  Clinical staff conducted group or individual video education with verbal and written material and guidebook.  Patient learns how important breakfast is to satiety and nutrition through the entire day. Recommendations include key foods to eat during breakfast to help stabilize blood sugar levels and to prevent overeating at meals later in the day. Planning ahead is also a key component.  Cooking - Educational psychologist conducted group or individual video education with verbal and written material and guidebook.  Patient learns eating strategies to improve overall health, including an approach to cook more at home. Recommendations include thinking of animal protein as a side on your plate rather than center stage and focusing instead on lower calorie dense options like vegetables, fruits, whole grains, and plant-based proteins, such as beans. Making sauces in large quantities to freeze for later and leaving the skin on your vegetables are also recommended to maximize your experience.  Cooking - Healthy Salads and Dressing Clinical staff conducted group or individual video  education with verbal and written material and guidebook.  Patient learns that vegetables, fruits, whole grains, and legumes are the foundations of the Pritikin Eating Plan. Recommendations include how to incorporate each of these in flavorful and healthy salads, and how to create homemade salad dressings. Proper handling of ingredients is also covered. Cooking - Soups and [employer] - Soups and Desserts Clinical staff conducted group or individual video education with verbal and written material and guidebook.  Patient learns that Pritikin soups and desserts make for easy, nutritious, and delicious snacks and meal components that are low in sodium, fat, sugar, and calorie density, while high in vitamins, minerals, and filling fiber. Recommendations include simple and healthy ideas for soups and desserts.   Overview     The Pritikin Solution Program Overview Clinical staff conducted group or individual video education with verbal and written material and guidebook.  Patient learns that the results of the Pritikin Program have been documented in more than 100 articles published in peer-reviewed journals, and the benefits include reducing risk factors for (and, in some cases, even reversing) high cholesterol,  high blood pressure, type 2 diabetes, obesity, and more! An overview of the three key pillars of the Pritikin Program will be covered: eating well, doing regular exercise, and having a healthy mind-set.  WORKSHOPS  Exercise: Exercise Basics: Building Your Action Plan Clinical staff led group instruction and group discussion with PowerPoint presentation and patient guidebook. To enhance the learning environment the use of posters, models and videos may be added. At the conclusion of this workshop, patients will comprehend the difference between physical activity and exercise, as well as the benefits of incorporating both, into their routine. Patients will understand the FITT (Frequency,  Intensity, Time, and Type) principle and how to use it to build an exercise action plan. In addition, safety concerns and other considerations for exercise and cardiac rehab will be addressed by the presenter. The purpose of this lesson is to promote a comprehensive and effective weekly exercise routine in order to improve patients' overall level of fitness.   Managing Heart Disease: Your Path to a Healthier Heart Clinical staff led group instruction and group discussion with PowerPoint presentation and patient guidebook. To enhance the learning environment the use of posters, models and videos may be added.At the conclusion of this workshop, patients will understand the anatomy and physiology of the heart. Additionally, they will understand how Pritikin's three pillars impact the risk factors, the progression, and the management of heart disease.  The purpose of this lesson is to provide a high-level overview of the heart, heart disease, and how the Pritikin lifestyle positively impacts risk factors.  Exercise Biomechanics Clinical staff led group instruction and group discussion with PowerPoint presentation and patient guidebook. To enhance the learning environment the use of posters, models and videos may be added. Patients will learn how the structural parts of their bodies function and how these functions impact their daily activities, movement, and exercise. Patients will learn how to promote a neutral spine, learn how to manage pain, and identify ways to improve their physical movement in order to promote healthy living. The purpose of this lesson is to expose patients to common physical limitations that impact physical activity. Participants will learn practical ways to adapt and manage aches and pains, and to minimize their effect on regular exercise. Patients will learn how to maintain good posture while sitting, walking, and lifting.  Balance Training and Fall Prevention  Clinical  staff led group instruction and group discussion with PowerPoint presentation and patient guidebook. To enhance the learning environment the use of posters, models and videos may be added. At the conclusion of this workshop, patients will understand the importance of their sensorimotor skills (vision, proprioception, and the vestibular system) in maintaining their ability to balance as they age. Patients will apply a variety of balancing exercises that are appropriate for their current level of function. Patients will understand the common causes for poor balance, possible solutions to these problems, and ways to modify their physical environment in order to minimize their fall risk. The purpose of this lesson is to teach patients about the importance of maintaining balance as they age and ways to minimize their risk of falling.  WORKSHOPS   Nutrition:  Fueling a Ship broker led group instruction and group discussion with PowerPoint presentation and patient guidebook. To enhance the learning environment the use of posters, models and videos may be added. Patients will review the foundational principles of the Pritikin Eating Plan and understand what constitutes a serving size in each of the food groups. Patients will  also learn Pritikin-friendly foods that are better choices when away from home and review make-ahead meal and snack options. Calorie density will be reviewed and applied to three nutrition priorities: weight maintenance, weight loss, and weight gain. The purpose of this lesson is to reinforce (in a group setting) the key concepts around what patients are recommended to eat and how to apply these guidelines when away from home by planning and selecting Pritikin-friendly options. Patients will understand how calorie density may be adjusted for different weight management goals.  Mindful Eating  Clinical staff led group instruction and group discussion with PowerPoint  presentation and patient guidebook. To enhance the learning environment the use of posters, models and videos may be added. Patients will briefly review the concepts of the Pritikin Eating Plan and the importance of low-calorie dense foods. The concept of mindful eating will be introduced as well as the importance of paying attention to internal hunger signals. Triggers for non-hunger eating and techniques for dealing with triggers will be explored. The purpose of this lesson is to provide patients with the opportunity to review the basic principles of the Pritikin Eating Plan, discuss the value of eating mindfully and how to measure internal cues of hunger and fullness using the Hunger Scale. Patients will also discuss reasons for non-hunger eating and learn strategies to use for controlling emotional eating.  Targeting Your Nutrition Priorities Clinical staff led group instruction and group discussion with PowerPoint presentation and patient guidebook. To enhance the learning environment the use of posters, models and videos may be added. Patients will learn how to determine their genetic susceptibility to disease by reviewing their family history. Patients will gain insight into the importance of diet as part of an overall healthy lifestyle in mitigating the impact of genetics and other environmental insults. The purpose of this lesson is to provide patients with the opportunity to assess their personal nutrition priorities by looking at their family history, their own health history and current risk factors. Patients will also be able to discuss ways of prioritizing and modifying the Pritikin Eating Plan for their highest risk areas  Menu  Clinical staff led group instruction and group discussion with PowerPoint presentation and patient guidebook. To enhance the learning environment the use of posters, models and videos may be added. Using menus brought in from [employer], or printed from [employer], patients will apply the Pritikin dining out guidelines that were presented in the [employer] video. Patients will also be able to practice these guidelines in a variety of provided scenarios. The purpose of this lesson is to provide patients with the opportunity to practice hands-on learning of the Pritikin Dining Out guidelines with actual menus and practice scenarios.  Label Reading Clinical staff led group instruction and group discussion with PowerPoint presentation and patient guidebook. To enhance the learning environment the use of posters, models and videos may be added. Patients will review and discuss the Pritikin label reading guidelines presented in Pritikin's Label Reading Educational series video. Using fool labels brought in from local grocery stores and markets, patients will apply the label reading guidelines and determine if the packaged food meet the Pritikin guidelines. The purpose of this lesson is to provide patients with the opportunity to review, discuss, and practice hands-on learning of the Pritikin Label Reading guidelines with actual packaged food labels. Cooking School  Pritikin's [employer] are designed to teach patients ways to prepare quick, simple, and affordable recipes at home. The importance  of nutrition's role in chronic disease risk reduction is reflected in its emphasis in the overall Pritikin program. By learning how to prepare essential core Pritikin Eating Plan recipes, patients will increase control over what they eat; be able to customize the flavor of foods without the use of added salt, sugar, or fat; and improve the quality of the food they consume. By learning a set of core recipes which are easily assembled, quickly prepared, and affordable, patients are more likely to prepare more healthy foods at home. These workshops focus on convenient breakfasts, simple entres, side dishes, and desserts which can be prepared with  minimal effort and are consistent with nutrition recommendations for cardiovascular risk reduction. Cooking [employer] are taught by a Armed forces logistics/support/administrative officer (RD) who has been trained by the [employer]. The chef or RD has a clear understanding of the importance of minimizing - if not completely eliminating - added fat, sugar, and sodium in recipes. Throughout the series of Cooking School Workshop sessions, patients will learn about healthy ingredients and efficient methods of cooking to build confidence in their capability to prepare    Cooking School weekly topics:  Adding Flavor- Sodium-Free  Fast and Healthy Breakfasts  Powerhouse Plant-Based Proteins  Satisfying Salads and Dressings  Simple Sides and Sauces  International Cuisine-Spotlight on the [employer] Zones  Delicious Desserts  Savory Soups  [employer] - Meals in a Astronomer Appetizers and Snacks  Comforting Weekend Breakfasts  One-Pot Wonders   Fast Evening Meals  Landscape architect Your Pritikin Plate  WORKSHOPS   Healthy Mindset (Psychosocial):  Focused Goals, Sustainable Changes Clinical staff led group instruction and group discussion with PowerPoint presentation and patient guidebook. To enhance the learning environment the use of posters, models and videos may be added. Patients will be able to apply effective goal setting strategies to establish at least one personal goal, and then take consistent, meaningful action toward that goal. They will learn to identify common barriers to achieving personal goals and develop strategies to overcome them. Patients will also gain an understanding of how our mind-set can impact our ability to achieve goals and the importance of cultivating a positive and growth-oriented mind-set. The purpose of this lesson is to provide patients with a deeper understanding of how to set and achieve personal goals, as well as the tools and strategies needed  to overcome common obstacles which may arise along the way.  From Head to Heart: The Power of a Healthy Outlook  Clinical staff led group instruction and group discussion with PowerPoint presentation and patient guidebook. To enhance the learning environment the use of posters, models and videos may be added. Patients will be able to recognize and describe the impact of emotions and mood on physical health. They will discover the importance of self-care and explore self-care practices which may work for them. Patients will also learn how to utilize the 4 C's to cultivate a healthier outlook and better manage stress and challenges. The purpose of this lesson is to demonstrate to patients how a healthy outlook is an essential part of maintaining good health, especially as they continue their cardiac rehab journey.  Healthy Sleep for a Healthy Heart Clinical staff led group instruction and group discussion with PowerPoint presentation and patient guidebook. To enhance the learning environment the use of posters, models and videos may be added. At the conclusion of this workshop, patients will be able to demonstrate knowledge of the importance of sleep  to overall health, well-being, and quality of life. They will understand the symptoms of, and treatments for, common sleep disorders. Patients will also be able to identify daytime and nighttime behaviors which impact sleep, and they will be able to apply these tools to help manage sleep-related challenges. The purpose of this lesson is to provide patients with a general overview of sleep and outline the importance of quality sleep. Patients will learn about a few of the most common sleep disorders. Patients will also be introduced to the concept of "sleep hygiene," and discover ways to self-manage certain sleeping problems through simple daily behavior changes. Finally, the workshop will motivate patients by clarifying the links between quality sleep and their  goals of heart-healthy living.   Recognizing and Reducing Stress Clinical staff led group instruction and group discussion with PowerPoint presentation and patient guidebook. To enhance the learning environment the use of posters, models and videos may be added. At the conclusion of this workshop, patients will be able to understand the types of stress reactions, differentiate between acute and chronic stress, and recognize the impact that chronic stress has on their health. They will also be able to apply different coping mechanisms, such as reframing negative self-talk. Patients will have the opportunity to practice a variety of stress management techniques, such as deep abdominal breathing, progressive muscle relaxation, and/or guided imagery.  The purpose of this lesson is to educate patients on the role of stress in their lives and to provide healthy techniques for coping with it.  Learning Barriers/Preferences:  Learning Barriers/Preferences - 10/28/23 1347       Learning Barriers/Preferences   Learning Barriers Exercise Concerns   some balance concerns and SOB   Learning Preferences Group Instruction;Skilled Demonstration;Written Material;Individual Instruction          Education Topics:  Knowledge Questionnaire Score:  Knowledge Questionnaire Score - 10/28/23 1348       Knowledge Questionnaire Score   Pre Score 23/24          Core Components/Risk Factors/Patient Goals at Admission:  Personal Goals and Risk Factors at Admission - 10/28/23 1349       Core Components/Risk Factors/Patient Goals on Admission    Weight Management Yes    Intervention Weight Management: Develop a combined nutrition and exercise program designed to reach desired caloric intake, while maintaining appropriate intake of nutrient and fiber, sodium and fats, and appropriate energy expenditure required for the weight goal.;Weight Management: Provide education and appropriate resources to help participant  work on and attain dietary goals.;Weight Management/Obesity: Establish reasonable short term and long term weight goals.;Obesity: Provide education and appropriate resources to help participant work on and attain dietary goals.    Expected Outcomes Short Term: Continue to assess and modify interventions until short term weight is achieved;Long Term: Adherence to nutrition and physical activity/exercise program aimed toward attainment of established weight goal;Weight Maintenance: Understanding of the daily nutrition guidelines, which includes 25-35% calories from fat, 7% or less cal from saturated fats, less than 200mg  cholesterol, less than 1.5gm of sodium, & 5 or more servings of fruits and vegetables daily;Understanding recommendations for meals to include 15-35% energy as protein, 25-35% energy from fat, 35-60% energy from carbohydrates, less than 200mg  of dietary cholesterol, 20-35 gm of total fiber daily;Understanding of distribution of calorie intake throughout the day with the consumption of 4-5 meals/snacks    Diabetes Yes    Intervention Provide education about signs/symptoms and action to take for hypo/hyperglycemia.;Provide education about proper nutrition, including hydration,  and aerobic/resistive exercise prescription along with prescribed medications to achieve blood glucose in normal ranges: Fasting glucose 65-99 mg/dL    Expected Outcomes Short Term: Participant verbalizes understanding of the signs/symptoms and immediate care of hyper/hypoglycemia, proper foot care and importance of medication, aerobic/resistive exercise and nutrition plan for blood glucose control.;Long Term: Attainment of HbA1C < 7%.    Hypertension Yes    Intervention Provide education on lifestyle modifcations including regular physical activity/exercise, weight management, moderate sodium restriction and increased consumption of fresh fruit, vegetables, and low fat dairy, alcohol moderation, and smoking  cessation.;Monitor prescription use compliance.    Expected Outcomes Short Term: Continued assessment and intervention until BP is < 140/68mm HG in hypertensive participants. < 130/75mm HG in hypertensive participants with diabetes, heart failure or chronic kidney disease.;Long Term: Maintenance of blood pressure at goal levels.    Lipids Yes    Intervention Provide education and support for participant on nutrition & aerobic/resistive exercise along with prescribed medications to achieve LDL 70mg , HDL >40mg .    Expected Outcomes Short Term: Participant states understanding of desired cholesterol values and is compliant with medications prescribed. Participant is following exercise prescription and nutrition guidelines.;Long Term: Cholesterol controlled with medications as prescribed, with individualized exercise RX and with personalized nutrition plan. Value goals: LDL < 70mg , HDL > 40 mg.    Personal Goal Other Yes    Personal Goal Pt would like to increase strength, control SOB and get back on TM and bike    Intervention Will continue to monitor pt and progress workloads as tolerated    Expected Outcomes Pt will achieve his goals          Core Components/Risk Factors/Patient Goals Review:   Goals and Risk Factor Review     Row Name 11/11/23 0830 12/07/23 1139           Core Components/Risk Factors/Patient Goals Review   Personal Goals Review Weight Management/Obesity;Hypertension;Lipids;Diabetes Weight Management/Obesity;Hypertension;Lipids;Diabetes      Review Kevin Yang started exercise at cardiac rehab on 11/03/23. Kevin Yang is off to a good start to exercise. Kevin Yang's vital signs have been stable. Kevin Yang is not on any diabetic medications at this time. Graden is doing wel with exercise at cardiac rehab.. Kevin Yang's vital signs have been stable. Dorance has increased his met levels.      Expected Outcomes Kevin Yang will continue to participate in cardiac rehab for exercise, nutrition and  lifestylle modifications Kevin Yang will continue to participate in cardiac rehab for exercise, nutrition and lifestylle modifications         Core Components/Risk Factors/Patient Goals at Discharge (Final Review):   Goals and Risk Factor Review - 12/07/23 1139       Core Components/Risk Factors/Patient Goals Review   Personal Goals Review Weight Management/Obesity;Hypertension;Lipids;Diabetes    Review Kevin Yang is doing wel with exercise at cardiac rehab.. Kevin Yang's vital signs have been stable. Kevin Yang has increased his met levels.    Expected Outcomes Kevin Yang will continue to participate in cardiac rehab for exercise, nutrition and lifestylle modifications          ITP Comments:  ITP Comments     Row Name 10/28/23 1233 11/11/23 0824 12/07/23 1137       ITP Comments Introduction to Pritikin Education/Intensive cardiac Rehab. Initial Orientation Packet Reviewed with the patient. 30 Day ITP Review. Kevin Yang started cardiac rehab on 11/03/23. Kevin Yang is off to a good start with exercise. 30 Day ITP Review. Kevin Yang has good participation in cardiac rehab when in attendance.  Comments: see ITP comments.Hadassah Elpidio Quan RN BSN

## 2023-12-15 ENCOUNTER — Encounter (HOSPITAL_COMMUNITY)
Admission: RE | Admit: 2023-12-15 | Discharge: 2023-12-15 | Disposition: A | Discharge status: Home / Self Care | Source: Ambulatory Visit | Attending: Cardiology

## 2023-12-15 DIAGNOSIS — Z951 Presence of aortocoronary bypass graft: Secondary | ICD-10-CM | POA: Diagnosis not present

## 2023-12-17 ENCOUNTER — Encounter (HOSPITAL_COMMUNITY)
Admission: RE | Admit: 2023-12-17 | Discharge: 2023-12-17 | Disposition: A | Discharge status: Home / Self Care | Source: Ambulatory Visit | Attending: Cardiology | Admitting: Cardiology

## 2023-12-17 DIAGNOSIS — Z951 Presence of aortocoronary bypass graft: Secondary | ICD-10-CM | POA: Diagnosis not present

## 2023-12-20 ENCOUNTER — Encounter (HOSPITAL_COMMUNITY)
Admission: RE | Admit: 2023-12-20 | Discharge: 2023-12-20 | Disposition: A | Discharge status: Home / Self Care | Source: Ambulatory Visit | Attending: Cardiology | Admitting: Cardiology

## 2023-12-20 DIAGNOSIS — Z951 Presence of aortocoronary bypass graft: Secondary | ICD-10-CM | POA: Diagnosis not present

## 2023-12-22 ENCOUNTER — Encounter (HOSPITAL_COMMUNITY)
Admission: RE | Admit: 2023-12-22 | Discharge: 2023-12-22 | Disposition: A | Discharge status: Home / Self Care | Source: Ambulatory Visit | Attending: Cardiology

## 2023-12-22 DIAGNOSIS — Z951 Presence of aortocoronary bypass graft: Secondary | ICD-10-CM | POA: Diagnosis not present

## 2023-12-24 ENCOUNTER — Encounter (HOSPITAL_COMMUNITY)
Admission: RE | Admit: 2023-12-24 | Discharge: 2023-12-24 | Disposition: A | Discharge status: Home / Self Care | Source: Ambulatory Visit | Attending: Cardiology | Admitting: Cardiology

## 2023-12-24 DIAGNOSIS — Z951 Presence of aortocoronary bypass graft: Secondary | ICD-10-CM

## 2023-12-27 ENCOUNTER — Encounter (HOSPITAL_COMMUNITY)
Admission: RE | Admit: 2023-12-27 | Discharge: 2023-12-27 | Disposition: A | Discharge status: Home / Self Care | Source: Ambulatory Visit | Attending: Cardiology | Admitting: Cardiology

## 2023-12-27 DIAGNOSIS — Z951 Presence of aortocoronary bypass graft: Secondary | ICD-10-CM

## 2023-12-29 ENCOUNTER — Encounter (HOSPITAL_COMMUNITY)
Admission: RE | Admit: 2023-12-29 | Discharge: 2023-12-29 | Disposition: A | Discharge status: Home / Self Care | Source: Ambulatory Visit | Attending: Cardiology | Admitting: Cardiology

## 2023-12-29 DIAGNOSIS — Z951 Presence of aortocoronary bypass graft: Secondary | ICD-10-CM | POA: Diagnosis not present

## 2023-12-31 ENCOUNTER — Ambulatory Visit (INDEPENDENT_AMBULATORY_CARE_PROVIDER_SITE_OTHER): Admitting: Neurology

## 2023-12-31 ENCOUNTER — Encounter (HOSPITAL_COMMUNITY)
Admission: RE | Admit: 2023-12-31 | Discharge: 2023-12-31 | Disposition: A | Discharge status: Home / Self Care | Source: Ambulatory Visit | Attending: Cardiology | Admitting: Cardiology

## 2023-12-31 DIAGNOSIS — Z951 Presence of aortocoronary bypass graft: Secondary | ICD-10-CM | POA: Diagnosis present

## 2023-12-31 DIAGNOSIS — R202 Paresthesia of skin: Secondary | ICD-10-CM

## 2023-12-31 DIAGNOSIS — Z48812 Encounter for surgical aftercare following surgery on the circulatory system: Secondary | ICD-10-CM | POA: Insufficient documentation

## 2023-12-31 NOTE — Procedures (Signed)
  [facility]  [address] [place], [address]  [place], [place] [address] Tel: [phone] Fax: [phone] Test Date:  12/31/2023  Patient: Kevin Yang DOB: 1951/02/23 Physician: Tonita Blanch, DO  Sex: Male Height: 5' 9 Ref Phys: Niels Ada, MD  ID#: [id number]   Technician:    History: This is a 73 year old man referred for evaluation of right thigh numbness.  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  Findings show: Right sural and superficial peroneal sensory responses are within normal limits. Right peroneal and tibial motor responses are within normal limits. Right tibial H reflex study is within normal limits.  Impression: Nerve conduction study of the right lower extremity is within normal limits.  In particular, there is no evidence of a large fiber sensorimotor polyneuropathy.   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   ___________________________ Tonita Blanch, DO    Nerve Conduction Studies   Stim Site NR Peak (ms) Norm Peak (ms) O-P Amp (V) Norm O-P Amp  Right Sup Peroneal Anti Sensory (Ant Lat Mall)  32 C  12 cm    2.5 <4.6 6.2 >3  Right Sural Anti Sensory (Lat Mall)  32 C  Calf    2.7 <4.6 7.9 >3     Stim Site NR Onset (ms) Norm Onset (ms) O-P Amp (mV) Norm O-P Amp Site1 Site2 Delta-0 (ms) Dist (cm) Vel (m/s) Norm Vel (m/s)  Right Peroneal Motor (Ext Dig Brev)  32 C  Ankle    3.9 <6.0 7.8 >2.5 B Fib Ankle 7.8 37.0 47 >40  B Fib    11.7  7.0  Poplt B Fib 1.7 10.0 59 >40  Poplt    13.4  6.5         Right Tibial Motor (Abd Hall Brev)  32 C  Ankle    5.2 <6.0 12.8 >4 Knee Ankle 8.8 44.0 50 >40  Knee    14.0  6.4             Waveforms:

## 2024-01-03 ENCOUNTER — Encounter (HOSPITAL_COMMUNITY)

## 2024-01-03 ENCOUNTER — Telehealth (HOSPITAL_COMMUNITY): Payer: Self-pay | Admitting: *Deleted

## 2024-01-03 NOTE — Telephone Encounter (Signed)
 Kevin Yang left message on department voicemail this morning. He will be unable to attend cardiac rehab today but plans to return on Wednesday.

## 2024-01-05 ENCOUNTER — Telehealth (HOSPITAL_COMMUNITY): Payer: Self-pay | Admitting: *Deleted

## 2024-01-05 ENCOUNTER — Encounter (HOSPITAL_COMMUNITY): Admission: RE | Admit: 2024-01-05 | Source: Ambulatory Visit

## 2024-01-05 NOTE — Telephone Encounter (Signed)
 LVM stating he will not be able to attend Cardiac Rehab today.  Aliene Aris BS, ACSM-CEP 01/05/2024 8:01 AM

## 2024-01-07 ENCOUNTER — Encounter (HOSPITAL_COMMUNITY)
Admission: RE | Admit: 2024-01-07 | Discharge: 2024-01-07 | Disposition: A | Discharge status: Home / Self Care | Source: Ambulatory Visit | Attending: Cardiology | Admitting: Cardiology

## 2024-01-07 DIAGNOSIS — Z951 Presence of aortocoronary bypass graft: Secondary | ICD-10-CM

## 2024-01-07 DIAGNOSIS — Z48812 Encounter for surgical aftercare following surgery on the circulatory system: Secondary | ICD-10-CM | POA: Diagnosis not present

## 2024-01-07 NOTE — Progress Notes (Signed)
 Cardiac Individual Treatment Plan  Patient Details  Name: Kevin Yang MRN: [id number] Date of Birth: 1950/06/24 Referring Provider:   Flowsheet Row INTENSIVE CARDIAC REHAB ORIENT from 10/28/2023 in [facility], Vascular, & [facility]  Referring Provider Dr. Ripley (Dr. Wilbert Bihari covering)    Initial Encounter Date:  Flowsheet Row INTENSIVE CARDIAC REHAB ORIENT from 10/28/2023 in [facility] for Heart, Vascular, & Lung Health  Date 10/28/23    Visit Diagnosis: 08/23/23  S/P CABG x 4 at [facility]  Patient's Home Medications on Admission:  Current Outpatient Medications:    albuterol  (PROVENTIL  HFA;VENTOLIN  HFA) 108 (90 BASE) MCG/ACT inhaler, Inhale 2 puffs into the lungs every 6 (six) hours as needed for wheezing or shortness of breath. , Disp: , Rfl:    atorvastatin (LIPITOR) 80 MG tablet, TAKE ONE TABLET BY MOUTH AT BEDTIME FOR CHOLESTEROL, Disp: , Rfl:    Benzoyl Peroxide 10 % LIQD, WASH AS DIRECTED AFFECTED AREA DAILY : APPLY TO THE SCALP, CHEST AND BACK DAILY IN THE SHOWER. RINSE OFF COMPLETELY. DRY OFF WITH WHITE TOWELS SINCE THIS WASH CAN BLEACH COLORS, Disp: , Rfl:    budesonide-formoterol (SYMBICORT) 160-4.5 MCG/ACT inhaler, Inhale 2 puffs into the lungs 2 (two) times daily., Disp: , Rfl:    Carboxymethylcellulose Sodium 1 % GEL, APPLY 1 DROP TO EACH EYE FOUR TIMES A DAY, Disp: , Rfl:    cetirizine (ZYRTEC) 10 MG tablet, Take 1 tablet by mouth daily., Disp: , Rfl:    diclofenac Sodium (VOLTAREN) 1 % GEL, APPLY 4 GRAMS TO AFFECTED AREA FOUR TIMES A DAY AS NEEDED, Disp: , Rfl:    dicyclomine (BENTYL) 10 MG capsule, TAKE ONE CAPSULE BY MOUTH TWICE A DAY BEFORE MEALS, Disp: , Rfl:    fluticasone (FLONASE) 50 MCG/ACT nasal spray, Place 1 spray into both nostrils as needed. 2 sprays each nostril once a day, Disp: , Rfl:    formoterol (PERFOROMIST) 20 MCG/2ML nebulizer solution, Take 20 mcg by  nebulization 4 (four) times daily., Disp: , Rfl:    HYDROcodone-acetaminophen (NORCO) 10-325 MG tablet, TAKE ONE-HALF TO ONE TABLET BY MOUTH TWICE A DAY AS NEEDED (9/73M,10/72M), Disp: , Rfl:    inFLIXimab-abda 100 MG SOLR, Inject into the vein every 6 (six) weeks., Disp: , Rfl:    ipratropium-albuterol  (DUONEB) 0.5-2.5 (3) MG/3ML SOLN, Inhale 3 mLs into the lungs every 4 (four) hours as needed (for wheezing)., Disp: , Rfl:    lisinopril (ZESTRIL) 10 MG tablet, Take 1 tablet by mouth daily., Disp: , Rfl:    loperamide (IMODIUM) 2 MG capsule, TAKE ONE CAPSULE BY MOUTH AS DIRECTED BY YOUR PROVIDER AS NEEDED    AFTER EACH LOOSE STOOL NO MORE THAN 4 TIMES PER DAY, Disp: , Rfl:    naloxone (NARCAN) nasal spray 4 mg/0.1 mL, SPRAY 1 SPRAY INTO ONE NOSTRIL AS DIRECTED FOR OPIOID OVERDOSE (TURN PERSON ON SIDE AFTER DOSE. IF NO RESPONSE IN 2-3 MINUTES OR PERSON RESPONDS BUT RELAPSES, REPEAT USING A NEW SPRAY DEVICE AND SPRAY INTO THE OTHER NOSTRIL. CALL 911 AFTER USE.) * EMERGENCY USE ONLY *, Disp: , Rfl:    omeprazole (PRILOSEC) 40 MG capsule, TAKE ONE CAPSULE BY MOUTH DAILY (TAKE ON AN EMPTY STOMACH 30 MINUTES PRIOR TO A MEAL), Disp: , Rfl:    predniSONE  (DELTASONE ) 10 MG tablet, TAKE TWO TABLETS BY MOUTH IN THE MORNING -CONTINUE THE 20 MG DAILY UNTIL FURTHER INSTRUCTED (TAKE WITH FOOD), Disp: , Rfl:  sildenafil  (VIAGRA ) 50 MG tablet, Take 1 tablet (50 mg total) by mouth as needed for erectile dysfunction., Disp: 10 tablet, Rfl: 2   simvastatin (ZOCOR) 80 MG tablet, Take 80 mg by mouth at bedtime., Disp: , Rfl:    tamsulosin (FLOMAX) 0.4 MG CAPS, Take 0.4 mg by mouth at bedtime., Disp: , Rfl:   Past Medical History: Past Medical History:  Diagnosis Date   Asthma    Colitis    COPD (chronic obstructive pulmonary disease) (HCC)    Crohn's disease (HCC)     Tobacco Use: Social History   Tobacco Use  Smoking Status Former   Types: Cigars   Quit date: 09/07/1986   Years since quitting: 37.3   Smokeless Tobacco Never    Labs: Review Flowsheet       Latest Ref Rng & Units 04/22/2007 04/07/2009 04/08/2009 10/09/2015  Labs for ITP Cardiac and Pulmonary Rehab  Cholestrol 0 - 200 mg/dL 787  - 729        ATP III CLASSIFICATION:  <200     mg/dL   Desirable  799-760  mg/dL   Borderline High  >=759    mg/dL   High         717   LDL (calc) 0 - 99 mg/dL - - 793        Total Cholesterol/HDL:CHD Risk Coronary Heart Disease Risk Table                     Men   Women  1/2 Average Risk   3.4   3.3  Average Risk       5.0   4.4  2 X Average Risk   9.6   7.1  3 X Average Risk  23.4   11.0        Use the calculated Patient Ratio above and the CHD Risk Table to determine the patient's CHD Risk.        ATP III CLASSIFICATION (LDL):  <100     mg/dL   Optimal  899-870  mg/dL   Near or Above                    Optimal  130-159  mg/dL   Borderline  839-810  mg/dL   High  >809     mg/dL   Very High  810   Direct LDL mg/dL 853.4  - - -  HDL-C >60.99 mg/dL 62.6  - 40  43.09   Trlycerides 0.0 - 149.0 mg/dL 886  - 877  819.9   TCO2 0 - 100 mmol/L - 30  - -    Capillary Blood Glucose: No results found for: GLUCAP   Exercise Target Goals: Exercise Program Goal: Individual exercise prescription set using results from initial 6 min walk test and THRR while considering  patient's activity barriers and safety.   Exercise Prescription Goal: Initial exercise prescription builds to 30-45 minutes a day of aerobic activity, 2-3 days per week.  Home exercise guidelines will be given to patient during program as part of exercise prescription that the participant will acknowledge.  Activity Barriers & Risk Stratification:  Activity Barriers & Cardiac Risk Stratification - 10/28/23 1336       Activity Barriers & Cardiac Risk Stratification   Activity Barriers Back Problems;Shortness of Breath;Balance Concerns    Cardiac Risk Stratification High          6 Minute Walk:  6 Minute Walk  Row Name 10/28/23 1335         6 Minute Walk   Phase Initial     Distance 1405 feet     Walk Time 6 minutes     # of Rest Breaks 0     MPH 2.66     METS 2.99     RPE 12     Perceived Dyspnea  2     VO2 Peak 10.46     Symptoms Yes (comment)     Comments Chronic low back pain 4/10     Resting HR 73 bpm     Resting BP 148/80     Resting Oxygen Saturation  95 %     Exercise Oxygen Saturation  during 6 min walk 93 %     Max Ex. HR 92 bpm     Max Ex. BP 168/88     2 Minute Post BP 158/78        Oxygen Initial Assessment:   Oxygen Re-Evaluation:   Oxygen Discharge (Final Oxygen Re-Evaluation):   Initial Exercise Prescription:  Initial Exercise Prescription - 10/28/23 1300       Date of Initial Exercise RX and Referring Provider   Date 10/28/23    Referring Provider Dr. Ripley (Dr. Wilbert Bihari covering)    Expected Discharge Date 01/19/24      Treadmill   MPH 2.5    Grade 0    Minutes 15    METs 2.91      Recumbant Bike   Level 2    RPM 35    Watts 50    Minutes 15    METs 2.1      Prescription Details   Frequency (times per week) 3    Duration Progress to 30 minutes of continuous aerobic without signs/symptoms of physical distress      Intensity   THRR 40-80% of Max Heartrate 60-119    Ratings of Perceived Exertion 11-13    Perceived Dyspnea 0-4      Progression   Progression Continue progressive overload as per policy without signs/symptoms or physical distress.      Resistance Training   Training Prescription Yes    Weight 3    Reps 10-15          Perform Capillary Blood Glucose checks as needed.  Exercise Prescription Changes:   Exercise Prescription Changes     Row Name 11/01/23 0830 11/15/23 0924 12/06/23 0849 12/15/23 0848       Response to Exercise   Blood Pressure (Admit) 146/82 150/80 122/64 122/62    Blood Pressure (Exercise) 158/82 152/70 -- --    Blood Pressure (Exit) 132/74 130/84 138/70 112/60    Heart Rate  (Admit) 73 bpm 74 bpm 74 bpm 64 bpm    Heart Rate (Exercise) 97 bpm 104 bpm 93 bpm 88 bpm    Heart Rate (Exit) 83 bpm 79 bpm 72 bpm 67 bpm    Rating of Perceived Exertion (Exercise) 13 12.5 12 12     Perceived Dyspnea (Exercise) 1 1 1 1     Symptoms none none none none    Comments Pt first day in the [employer] program Reviewed MET's, goals and home ExRx Reviewed MET's Reviewed MET's and goal    Duration Progress to 30 minutes of  aerobic without signs/symptoms of physical distress Progress to 30 minutes of  aerobic without signs/symptoms of physical distress Progress to 30 minutes of  aerobic without signs/symptoms of physical distress Progress to 30  minutes of  aerobic without signs/symptoms of physical distress    Intensity THRR unchanged THRR unchanged THRR unchanged THRR unchanged      Progression   Progression Continue to progress workloads to maintain intensity without signs/symptoms of physical distress. Continue to progress workloads to maintain intensity without signs/symptoms of physical distress. Continue to progress workloads to maintain intensity without signs/symptoms of physical distress. Continue to progress workloads to maintain intensity without signs/symptoms of physical distress.    Average METs 2.46 2.56 2.61 3.38      Resistance Training   Training Prescription Yes Yes Yes Yes    Weight 3 3 3 3     Reps 10-15 10-15 10-15 10-15    Time 10 Minutes 10 Minutes 10 Minutes 10 Minutes      Treadmill   MPH 2.5 2.5 2.5 2.5    Grade 0 0 0 1    Minutes 15 15 15 15     METs 2.91 2.91 2.91 3.26      Recumbant Bike   Level 2 2 2 3     RPM 67 84 80 86    Watts 14 21 27  51    Minutes 15 15 15 15     METs 2 2.2 2.3 3.5      Home Exercise Plan   Plans to continue exercise at -- Home (comment) Home (comment) Home (comment)    Frequency -- Add 2 additional days to program exercise sessions. Add 2 additional days to program exercise sessions. Add 2 additional days to program exercise  sessions.    Initial Home Exercises Provided -- 11/15/23 11/15/23 11/15/23       Exercise Comments:   Exercise Comments     Row Name 11/01/23 1652 11/15/23 0934 12/06/23 0853 12/15/23 0852 12/31/23 0853   Exercise Comments Pt first day in the [facility] program. Pt tolerated exercise well with an average MET level of 2.46. Pt is leanring his THRR, RPE and ExRx. He's off to a great start. Pt will continue to exercise and increase WL as tolerated Reviewed MET's, goals and home ExRx. Pt tolerated exercise well with an average MET level of 2.56. Pt is feeling good about his goals and is already feeling an increase in strength and a decrease in SOB. He will continue to exercise by walking and using his TM at home 2 days for 30 mins. Reviewed MET's. Pt tolerated exercise well with an average MET level of 2.61. Pt is feeling good with exercise, encouraged increases in WL to increase MET's and strength, he feels good about this, plans to increase on next visit Reviewed MET's and goals. Pt tolerated exercise well with an average MET level of 3.38. Pt is doing well and progressing MET's and strength. He says he doesnt even notice his SOB with exercise anymore, but will have flair up occasionally some times with the heat. Reviewed MET's. Pt tolerated exercise well with an average MET level of 3.37. Pt is doing well. Talked about gradual increases overtime.      Exercise Goals and Review:   Exercise Goals     Row Name 10/28/23 1343             Exercise Goals   Increase Physical Activity Yes       Intervention Provide advice, education, support and counseling about physical activity/exercise needs.;Develop an individualized exercise prescription for aerobic and resistive training based on initial evaluation findings, risk stratification, comorbidities and participant's personal goals.       Expected Outcomes  Short Term: Attend rehab on a regular basis to increase amount of physical activity.;Long  Term: Add in home exercise to make exercise part of routine and to increase amount of physical activity.;Long Term: Exercising regularly at least 3-5 days a week.       Increase Strength and Stamina Yes       Intervention Provide advice, education, support and counseling about physical activity/exercise needs.;Develop an individualized exercise prescription for aerobic and resistive training based on initial evaluation findings, risk stratification, comorbidities and participant's personal goals.       Expected Outcomes Short Term: Increase workloads from initial exercise prescription for resistance, speed, and METs.;Short Term: Perform resistance training exercises routinely during rehab and add in resistance training at home;Long Term: Improve cardiorespiratory fitness, muscular endurance and strength as measured by increased METs and functional capacity ( )       Able to understand and use rate of perceived exertion (RPE) scale Yes       Intervention Provide education and explanation on how to use RPE scale       Expected Outcomes Short Term: Able to use RPE daily in rehab to express subjective intensity level;Long Term:  Able to use RPE to guide intensity level when exercising independently       Able to understand and use Dyspnea scale Yes       Intervention Provide education and explanation on how to use Dyspnea scale       Expected Outcomes Short Term: Able to use Dyspnea scale daily in rehab to express subjective sense of shortness of breath during exertion;Long Term: Able to use Dyspnea scale to guide intensity level when exercising independently       Knowledge and understanding of Target Heart Rate Range (THRR) Yes       Intervention Provide education and explanation of THRR including how the numbers were predicted and where they are located for reference       Expected Outcomes Short Term: Able to state/look up THRR;Short Term: Able to use daily as guideline for intensity in rehab;Long  Term: Able to use THRR to govern intensity when exercising independently       Understanding of Exercise Prescription Yes       Intervention Provide education, explanation, and written materials on patient's individual exercise prescription       Expected Outcomes Short Term: Able to explain program exercise prescription;Long Term: Able to explain home exercise prescription to exercise independently          Exercise Goals Re-Evaluation :  Exercise Goals Re-Evaluation     Row Name 11/01/23 0830 11/15/23 0930 12/15/23 0850         Exercise Goal Re-Evaluation   Exercise Goals Review Increase Physical Activity;Understanding of Exercise Prescription;Increase Strength and Stamina;Knowledge and understanding of Target Heart Rate Range (THRR);Able to understand and use rate of perceived exertion (RPE) scale Increase Physical Activity;Understanding of Exercise Prescription;Increase Strength and Stamina;Knowledge and understanding of Target Heart Rate Range (THRR);Able to understand and use rate of perceived exertion (RPE) scale Increase Physical Activity;Understanding of Exercise Prescription;Increase Strength and Stamina;Knowledge and understanding of Target Heart Rate Range (THRR);Able to understand and use rate of perceived exertion (RPE) scale     Comments Pt first day in the [facility] program. Pt tolerated exercise well with an average MET level of 2.46. Pt is leanring his THRR, RPE and ExRx. He's off to a great start Reviewed MET's, goals and home ExRx. Pt tolerated exercise well with an average MET level  of 2.56. Pt is feeling good about his goals and is already feeling an increase in strength and a decrease in SOB. He will continue to exercise by walking and using his TM at home 2 days for 30 mins. Reviewed MET's and goals. Pt tolerated exercise well with an average MET level of 3.38. Pt is doing well and progressing MET's and strength. He says he doesnt even notice his SOB with exercise anymore,  but will have flair up occasionally some times with the heat.     Expected Outcomes Pt will continue to exercise and increase WL as tolerated Pt will continue to exercise and increase WL as tolerated Pt will continue to exercise and increase WL as tolerated        Discharge Exercise Prescription (Final Exercise Prescription Changes):  Exercise Prescription Changes - 12/15/23 0848       Response to Exercise   Blood Pressure (Admit) 122/62    Blood Pressure (Exit) 112/60    Heart Rate (Admit) 64 bpm    Heart Rate (Exercise) 88 bpm    Heart Rate (Exit) 67 bpm    Rating of Perceived Exertion (Exercise) 12    Perceived Dyspnea (Exercise) 1    Symptoms none    Comments Reviewed MET's and goal    Duration Progress to 30 minutes of  aerobic without signs/symptoms of physical distress    Intensity THRR unchanged      Progression   Progression Continue to progress workloads to maintain intensity without signs/symptoms of physical distress.    Average METs 3.38      Resistance Training   Training Prescription Yes    Weight 3    Reps 10-15    Time 10 Minutes      Treadmill   MPH 2.5    Grade 1    Minutes 15    METs 3.26      Recumbant Bike   Level 3    RPM 86    Watts 51    Minutes 15    METs 3.5      Home Exercise Plan   Plans to continue exercise at Home (comment)    Frequency Add 2 additional days to program exercise sessions.    Initial Home Exercises Provided 11/15/23          Nutrition:  Target Goals: Understanding of nutrition guidelines, daily intake of sodium 1500mg , cholesterol 200mg , calories 30% from fat and 7% or less from saturated fats, daily to have 5 or more servings of fruits and vegetables.  Biometrics:  Pre Biometrics - 10/28/23 1334       Pre Biometrics   Height 5' 9.25 (1.759 m)    Waist Circumference 45 inches    Hip Circumference 41 inches    Waist to Hip Ratio 1.1 %    BMI (Calculated) 31.61    Triceps Skinfold 15 mm    % Body Fat  31.3 %    Grip Strength 21 kg    Flexibility 10.5 in    Single Leg Stand 22 seconds           Nutrition Therapy Plan and Nutrition Goals:  Nutrition Therapy & Goals - 12/31/23 0859       Nutrition Therapy   Diet Heart Healthy Diet    Drug/Food Interactions Statins/Certain Fruits      Personal Nutrition Goals   Nutrition Goal Patient to identify strategies for reducing cardiovascular risk by attending the [facility] education and nutrition series weekly.  Goal in progress.   Personal Goal #2 Patient to improve diet quality by using the plate method as a guide for meal planning to include lean protein/plant protein, fruits, vegetables, whole grains, nonfat dairy as part of a well-balanced diet.   Goal in progress.   Comments Goal in progress. Patient has medical history of CAD, COPD, Crohn's disease, diverticulitis, DM2, HTN, OSA, CABGx4. He has previously completed pulmonary rehab in 2022 and 2019. LDL is not at goal. A1c is well controlled at this time. He is down 3.3# since starting with our program. He does continue to attend the [facility] education/nutrition series regularly.  Patient will continue to benefit from participation in intensive cardiac rehab for nutrition education, exercise, and lifestyle modification.      Intervention Plan   Intervention Prescribe, educate and counsel regarding individualized specific dietary modifications aiming towards targeted core components such as weight, hypertension, lipid management, diabetes, heart failure and other comorbidities.;Nutrition handout(s) given to patient.    Expected Outcomes Short Term Goal: Understand basic principles of dietary content, such as calories, fat, sodium, cholesterol and nutrients.;Long Term Goal: Adherence to prescribed nutrition plan.          Nutrition Assessments:  MEDIFICTS Score Key: >=70 Need to make dietary changes  40-70 Heart Healthy Diet <= 40 Therapeutic Level Cholesterol Diet   Flowsheet Row  INTENSIVE CARDIAC REHAB from 11/22/2023 in [facility] for Heart, Vascular, & Lung Health  Picture Your Plate Total Score on Admission 51   Picture Your Plate Scores: <59 Unhealthy dietary pattern with much room for improvement. 41-50 Dietary pattern unlikely to meet recommendations for good health and room for improvement. 51-60 More healthful dietary pattern, with some room for improvement.  >60 Healthy dietary pattern, although there may be some specific behaviors that could be improved.    Nutrition Goals Re-Evaluation:  Nutrition Goals Re-Evaluation     Row Name 11/01/23 1401 12/01/23 0849 12/31/23 0859         Goals   Current Weight 214 lb 15.2 oz (97.5 kg) 212 lb 15.4 oz (96.6 kg) 212 lb 4.9 oz (96.3 kg)     Comment total cholesterol 198, triglycerides 149, HDL 40, LDL 142, A1c 6.6 no new labs; most recent labs  total cholesterol 198, triglycerides 149, HDL 40, LDL 142, A1c 6.6 no new labs; most recent labs total cholesterol 198, triglycerides 149, HDL 40, LDL 142, A1c 6.6     Expected Outcome Patient has medical history of CAD, COPD, Crohn's disease, diverticulitis, DM2, HTN, OSA, CABGx4. He has previously completed pulmonary rehab in 2022 and 2019. LDL is not at goal. A1c is well controlled at this time. Patient will benefit from participation in intensive cardiac rehab for nutrition education, exercise, and lifestyle modification. Goal in progress. Patient has medical history of CAD, COPD, Crohn's disease, diverticulitis, DM2, HTN, OSA, CABGx4. He has previously completed pulmonary rehab in 2022 and 2019. LDL is not at goal. A1c is well controlled at this time. He is down 2.6# since starting with our program. He does continue to attend the [facility] education/nutrition series regularly. Patient will continue to benefit from participation in intensive cardiac rehab for nutrition education, exercise, and lifestyle modification. Goal in progress. Patient has medical  history of CAD, COPD, Crohn's disease, diverticulitis, DM2, HTN, OSA, CABGx4. He has previously completed pulmonary rehab in 2022 and 2019. LDL is not at goal. A1c is well controlled at this time. He is down 3.3# since starting with our program.  He does continue to attend the [facility] education/nutrition series regularly. Patient will continue to benefit from participation in intensive cardiac rehab for nutrition education, exercise, and lifestyle modification.        Nutrition Goals Re-Evaluation:  Nutrition Goals Re-Evaluation     Row Name 11/01/23 1401 12/01/23 0849 12/31/23 0859         Goals   Current Weight 214 lb 15.2 oz (97.5 kg) 212 lb 15.4 oz (96.6 kg) 212 lb 4.9 oz (96.3 kg)     Comment total cholesterol 198, triglycerides 149, HDL 40, LDL 142, A1c 6.6 no new labs; most recent labs  total cholesterol 198, triglycerides 149, HDL 40, LDL 142, A1c 6.6 no new labs; most recent labs total cholesterol 198, triglycerides 149, HDL 40, LDL 142, A1c 6.6     Expected Outcome Patient has medical history of CAD, COPD, Crohn's disease, diverticulitis, DM2, HTN, OSA, CABGx4. He has previously completed pulmonary rehab in 2022 and 2019. LDL is not at goal. A1c is well controlled at this time. Patient will benefit from participation in intensive cardiac rehab for nutrition education, exercise, and lifestyle modification. Goal in progress. Patient has medical history of CAD, COPD, Crohn's disease, diverticulitis, DM2, HTN, OSA, CABGx4. He has previously completed pulmonary rehab in 2022 and 2019. LDL is not at goal. A1c is well controlled at this time. He is down 2.6# since starting with our program. He does continue to attend the [facility] education/nutrition series regularly. Patient will continue to benefit from participation in intensive cardiac rehab for nutrition education, exercise, and lifestyle modification. Goal in progress. Patient has medical history of CAD, COPD, Crohn's disease, diverticulitis,  DM2, HTN, OSA, CABGx4. He has previously completed pulmonary rehab in 2022 and 2019. LDL is not at goal. A1c is well controlled at this time. He is down 3.3# since starting with our program. He does continue to attend the [facility] education/nutrition series regularly. Patient will continue to benefit from participation in intensive cardiac rehab for nutrition education, exercise, and lifestyle modification.        Nutrition Goals Discharge (Final Nutrition Goals Re-Evaluation):  Nutrition Goals Re-Evaluation - 12/31/23 0859       Goals   Current Weight 212 lb 4.9 oz (96.3 kg)    Comment no new labs; most recent labs total cholesterol 198, triglycerides 149, HDL 40, LDL 142, A1c 6.6    Expected Outcome Goal in progress. Patient has medical history of CAD, COPD, Crohn's disease, diverticulitis, DM2, HTN, OSA, CABGx4. He has previously completed pulmonary rehab in 2022 and 2019. LDL is not at goal. A1c is well controlled at this time. He is down 3.3# since starting with our program. He does continue to attend the Pritikin education/nutrition series regularly. Patient will continue to benefit from participation in intensive cardiac rehab for nutrition education, exercise, and lifestyle modification.          Psychosocial: Target Goals: Acknowledge presence or absence of significant depression and/or stress, maximize coping skills, provide positive support system. Participant is able to verbalize types and ability to use techniques and skills needed for reducing stress and depression.  Initial Review & Psychosocial Screening:  Initial Psych Review & Screening - 10/28/23 1235       Initial Review   Current issues with None Identified      Family Dynamics   Good Support System? Yes   Broadus has his wife, son and friends for support     Barriers   Psychosocial barriers to participate in  program The patient should benefit from training in stress management and relaxation.      Screening  Interventions   Interventions Encouraged to exercise          Quality of Life Scores:  Quality of Life - 10/28/23 1346       Quality of Life   Select Quality of Life      Quality of Life Scores   Health/Function Pre 22.5 %    Socioeconomic Pre 20.42 %    Psych/Spiritual Pre 24.86 %    Family Pre 24.2 %    GLOBAL Pre 22.88 %         Scores of 19 and below usually indicate a poorer quality of life in these areas.  A difference of  2-3 points is a clinically meaningful difference.  A difference of 2-3 points in the total score of the Quality of Life Index has been associated with significant improvement in overall quality of life, self-image, physical symptoms, and general health in studies assessing change in quality of life.  PHQ-9: Review Flowsheet  More data exists      10/28/2023 06/12/2021 05/05/2021 04/07/2021 03/24/2021  Depression screen PHQ 2/9  Decreased Interest 3 0 2 1 3   Down, Depressed, Hopeless 0 0 0 1 2  PHQ - 2 Score 3 0 2 2 5   Altered sleeping 3 2 0 2 3  Tired, decreased energy 2 2 1 3 3   Change in appetite 2 1 0 0 1  Feeling bad or failure about yourself  0 0 0 0 0  Trouble concentrating 1 0 0 0 1  Moving slowly or fidgety/restless 0 0 0 0 0  Suicidal thoughts 0 0 0 0 0  PHQ-9 Score 11 5 3 7 13   Difficult doing work/chores Somewhat difficult Somewhat difficult Somewhat difficult Somewhat difficult Very difficult   Interpretation of Total Score  Total Score Depression Severity:  1-4 = Minimal depression, 5-9 = Mild depression, 10-14 = Moderate depression, 15-19 = Moderately severe depression, 20-27 = Severe depression   Psychosocial Evaluation and Intervention:   Psychosocial Re-Evaluation:  Psychosocial Re-Evaluation     Row Name 11/11/23 0829 12/07/23 1138 01/07/24 [id number]         Psychosocial Re-Evaluation   Current issues with None Identified None Identified None Identified     Comments PHQ9 score reviewed by Theadora Byes RN M[facility].  Patient  denies feeling down or depressed. 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 He has no needs for a referral for counseling at this time and will let us  know if that changes for him Tydarius has not voiced any increased concerns or stressors during exercise at cardiac rehab. Ralpheal has not voiced any increased concerns or stressors during exercise at cardiac rehab.     Interventions Encouraged to attend Cardiac Rehabilitation for the exercise Encouraged to attend Cardiac Rehabilitation for the exercise Encouraged to attend Cardiac Rehabilitation for the exercise     Continue Psychosocial Services  No Follow up required No Follow up required No Follow up required        Psychosocial Discharge (Final Psychosocial Re-Evaluation):  Psychosocial Re-Evaluation - 01/07/24 0921       Psychosocial Re-Evaluation   Current issues with None Identified    Comments Hien has not voiced any increased concerns or stressors during exercise at cardiac rehab.    Interventions Encouraged to attend Cardiac Rehabilitation for the exercise    Continue Psychosocial Services  No Follow  up required          Vocational Rehabilitation: Provide vocational rehab assistance to qualifying candidates.   Vocational Rehab Evaluation & Intervention:  Vocational Rehab - 10/28/23 1236       Initial Vocational Rehab Evaluation & Intervention   Assessment shows need for Vocational Rehabilitation --   Jiles is retired and does not need vocational rehab at this time         Education: Education Goals: Education classes will be provided on a weekly basis, covering required topics. Participant will state understanding/return demonstration of topics presented.    Education     Row Name 11/03/23 0900     Education   Cardiac Education Topics Pritikin   Secondary school teacher School   Educator Dietitian   Weekly Topic Efficiency Cooking - Meals in a Snap   Instruction  Review Code 1- Verbalizes Understanding   Class Start Time 0815   Class Stop Time 0850   Class Time Calculation (min) 35 min    Row Name 11/05/23 0900     Education   Cardiac Education Topics Pritikin   Select Core Videos     Core Videos   Educator Exercise Physiologist   Select Exercise Education   Exercise Education Move It!   Instruction Review Code 1- Verbalizes Understanding   Class Start Time [phone]   Class Stop Time 0845   Class Time Calculation (min) 35 min    Row Name 11/08/23 0800     Education   Cardiac Education Topics Pritikin   Glass blower/designer Nutrition   Nutrition Workshop Targeting Your Nutrition Priorities   Instruction Review Code 1- Verbalizes Understanding   Class Start Time 0815   Class Stop Time 0855   Class Time Calculation (min) 40 min    Row Name 11/10/23 0800     Education   Cardiac Education Topics Pritikin   Secondary school teacher School   Educator Dietitian   Weekly Topic One-Pot Wonders   Instruction Review Code 1- Verbalizes Understanding   Class Start Time 0815   Class Stop Time 0850   Class Time Calculation (min) 35 min    Row Name 11/12/23 0800     Education   Cardiac Education Topics Pritikin   Select Core Videos     Core Videos   Educator Exercise Physiologist   Select General Education   General Education Hypertension and Heart Disease   Instruction Review Code 1- Verbalizes Understanding   Class Start Time [phone]   Class Stop Time 0847   Class Time Calculation (min) 35 min    Row Name 11/15/23 0800     Education   Cardiac Education Topics Pritikin   Select Workshops     Workshops   Educator Exercise Physiologist   Select Psychosocial   Psychosocial Workshop Focused Goals, Sustainable Changes   Instruction Review Code 1- Verbalizes Understanding   Class Start Time 0815   Class Stop Time 0850   Class Time Calculation (min) 35 min    Row Name 11/17/23  0900     Education   Cardiac Education Topics Pritikin   Orthoptist   Educator Dietitian   Weekly Topic Comforting Weekend Breakfasts   Instruction Review Code 1- Verbalizes Understanding   Class Start Time [phone]   Class Stop Time 0845   Class Time  Calculation (min) 35 min    Row Name 11/19/23 0800     Education   Cardiac Education Topics Pritikin   Select Core Videos     Core Videos   Educator Exercise Physiologist   Select Nutrition   Instruction Review Code 1- Verbalizes Understanding   Class Start Time 0820   Class Stop Time 0855   Class Time Calculation (min) 35 min    Row Name 11/22/23 0900     Education   Cardiac Education Topics Pritikin   Select Core Videos     Core Videos   Educator Exercise Physiologist   Select Exercise Education   Exercise Education Biomechanial Limitations   Instruction Review Code 1- Verbalizes Understanding   Class Start Time 0815   Class Stop Time 0847   Class Time Calculation (min) 32 min    Row Name 11/24/23 1000     Education   Cardiac Education Topics Pritikin   Secondary school teacher School   Educator Dietitian   Weekly Topic Fast Evening Meals   Instruction Review Code 1- Verbalizes Understanding   Class Start Time 0815   Class Stop Time 0856   Class Time Calculation (min) 41 min    Row Name 11/26/23 0800     Education   Cardiac Education Topics Pritikin   Select Core Videos     Core Videos   Educator Dietitian   Select Nutrition   Nutrition Vitamins and Minerals   Instruction Review Code 1- Verbalizes Understanding   Class Start Time 0815   Class Stop Time 0905   Class Time Calculation (min) 50 min    Row Name 12/01/23 0900     Education   Cardiac Education Topics Pritikin   Customer service manager   Weekly Topic International Cuisine- Spotlight on the [facility] Zones   Instruction Review Code 1- Verbalizes Understanding    Class Start Time 0815   Class Stop Time 0850   Class Time Calculation (min) 35 min    Row Name 12/06/23 0800     Education   Cardiac Education Topics Pritikin   Select Workshops     Workshops   Educator Exercise Physiologist   Select Psychosocial   Psychosocial Workshop Healthy Sleep for a Healthy Heart   Instruction Review Code 1- Verbalizes Understanding   Class Start Time 0815   Class Stop Time 0900   Class Time Calculation (min) 45 min    Row Name 12/08/23 0900     Education   Cardiac Education Topics Pritikin   Secondary school teacher School   Educator Dietitian   Weekly Topic Simple Sides and Sauces   Instruction Review Code 1- Verbalizes Understanding   Class Start Time 0815   Class Stop Time 0846   Class Time Calculation (min) 31 min    Row Name 12/10/23 0900     Education   Cardiac Education Topics Pritikin   Select Core Videos     Core Videos   Educator Exercise Physiologist   Select Psychosocial   Psychosocial How Our Thoughts Can Heal Our Hearts   Instruction Review Code 1- Verbalizes Understanding   Class Start Time 0815   Class Stop Time 0847   Class Time Calculation (min) 32 min    Row Name 12/13/23 0800     Education   Cardiac Education Topics Pritikin   Select Core Videos  Core Videos   Educator Exercise Industrial/product designer Education   General Education Hypertension and Heart Disease   Instruction Review Code 1- Verbalizes Understanding   Class Start Time 0815   Class Stop Time 0851   Class Time Calculation (min) 36 min    Row Name 12/15/23 0900     Education   Cardiac Education Topics Pritikin   Secondary school teacher School   Educator Dietitian   Weekly Topic Powerhouse Plant-Based Proteins   Instruction Review Code 1- Verbalizes Understanding   Class Start Time 0815   Class Stop Time 0850   Class Time Calculation (min) 35 min    Row Name 12/17/23 1000     Education   Cardiac Education  Topics Pritikin   Select Workshops     Workshops   Educator Exercise Physiologist   Select Exercise   Exercise Workshop Managing Heart Disease: Your Path to a Healthier Heart   Instruction Review Code 1- Verbalizes Understanding   Class Start Time [phone]   Class Stop Time 0903   Class Time Calculation (min) 47 min    Row Name 12/20/23 0900     Education   Cardiac Education Topics Pritikin   Select Workshops     Workshops   Educator Exercise Physiologist   Select Psychosocial   Psychosocial Workshop From Head to Heart: The Power of a Healthy Outlook   Instruction Review Code 1- Verbalizes Understanding   Class Start Time 0815   Class Stop Time 0853   Class Time Calculation (min) 38 min    Row Name 12/22/23 0900     Education   Cardiac Education Topics Pritikin   Secondary school teacher School   Educator Dietitian   Weekly Topic Tasty Appetizers and Snacks   Instruction Review Code 1- Verbalizes Understanding   Class Start Time 0815   Class Stop Time 0856   Class Time Calculation (min) 41 min    Row Name 12/24/23 0800     Education   Cardiac Education Topics Pritikin   Select Core Videos     Core Videos   Educator Exercise Physiologist   Select General Education   General Education Hypertension and Heart Disease   Instruction Review Code 1- Verbalizes Understanding   Class Start Time 0813   Class Stop Time 0848   Class Time Calculation (min) 35 min    Row Name 12/27/23 0900     Education   Cardiac Education Topics Pritikin   Nurse, children's   Educator Exercise Physiologist   Select Psychosocial   Psychosocial Healthy Minds, Bodies, Hearts   Instruction Review Code 1- Verbalizes Understanding   Class Start Time 0815   Class Stop Time 0850   Class Time Calculation (min) 35 min      Core Videos: Exercise    Move It!  Clinical staff conducted group or individual video education with verbal and written material and  guidebook.  Patient learns the recommended Pritikin exercise program. Exercise with the goal of living a long, healthy life. Some of the health benefits of exercise include controlled diabetes, healthier blood pressure levels, improved cholesterol levels, improved heart and lung capacity, improved sleep, and better body composition. Everyone should speak with their doctor before starting or changing an exercise routine.  Biomechanical Limitations Clinical staff conducted group or individual video education with verbal and written material and guidebook.  Patient learns how  biomechanical limitations can impact exercise and how we can mitigate and possibly overcome limitations to have an impactful and balanced exercise routine.  Body Composition Clinical staff conducted group or individual video education with verbal and written material and guidebook.  Patient learns that body composition (ratio of muscle mass to fat mass) is a key component to assessing overall fitness, rather than body weight alone. Increased fat mass, especially visceral belly fat, can put us  at increased risk for metabolic syndrome, type 2 diabetes, heart disease, and even death. It is recommended to combine diet and exercise (cardiovascular and resistance training) to improve your body composition. Seek guidance from your physician and exercise physiologist before implementing an exercise routine.  Exercise Action Plan Clinical staff conducted group or individual video education with verbal and written material and guidebook.  Patient learns the recommended strategies to achieve and enjoy long-term exercise adherence, including variety, self-motivation, self-efficacy, and positive decision making. Benefits of exercise include fitness, good health, weight management, more energy, better sleep, less stress, and overall well-being.  Medical   Heart Disease Risk Reduction Clinical staff conducted group or individual video education  with verbal and written material and guidebook.  Patient learns our heart is our most vital organ as it circulates oxygen, nutrients, white blood cells, and hormones throughout the entire body, and carries waste away. Data supports a plant-based eating plan like the Pritikin Program for its effectiveness in slowing progression of and reversing heart disease. The video provides a number of recommendations to address heart disease.   Metabolic Syndrome and Belly Fat  Clinical staff conducted group or individual video education with verbal and written material and guidebook.  Patient learns what metabolic syndrome is, how it leads to heart disease, and how one can reverse it and keep it from coming back. You have metabolic syndrome if you have 3 of the following 5 criteria: abdominal obesity, high blood pressure, high triglycerides, low HDL cholesterol, and high blood sugar.  Hypertension and Heart Disease Clinical staff conducted group or individual video education with verbal and written material and guidebook.  Patient learns that high blood pressure, or hypertension, is very common in the [place] . Hypertension is largely due to excessive salt intake, but other important risk factors include being overweight, physical inactivity, drinking too much alcohol, smoking, and not eating enough potassium from fruits and vegetables. High blood pressure is a leading risk factor for heart attack, stroke, congestive heart failure, dementia, kidney failure, and premature death. Long-term effects of excessive salt intake include stiffening of the arteries and thickening of heart muscle and organ damage. Recommendations include ways to reduce hypertension and the risk of heart disease.  Diseases of Our Time - Focusing on Diabetes Clinical staff conducted group or individual video education with verbal and written material and guidebook.  Patient learns why the best way to stop diseases of our time is prevention,  through food and other lifestyle changes. Medicine (such as prescription pills and surgeries) is often only a Band-Aid on the problem, not a long-term solution. Most common diseases of our time include obesity, type 2 diabetes, hypertension, heart disease, and cancer. The Pritikin Program is recommended and has been proven to help reduce, reverse, and/or prevent the damaging effects of metabolic syndrome.  Nutrition   Overview of the Pritikin Eating Plan  Clinical staff conducted group or individual video education with verbal and written material and guidebook.  Patient learns about the Pritikin Eating Plan for disease risk reduction. The Pritikin Eating  Plan emphasizes a wide variety of unrefined, minimally-processed carbohydrates, like fruits, vegetables, whole grains, and legumes. Go, Caution, and Stop food choices are explained. Plant-based and lean animal proteins are emphasized. Rationale provided for low sodium intake for blood pressure control, low added sugars for blood sugar stabilization, and low added fats and oils for coronary artery disease risk reduction and weight management.  Calorie Density  Clinical staff conducted group or individual video education with verbal and written material and guidebook.  Patient learns about calorie density and how it impacts the Pritikin Eating Plan. Knowing the characteristics of the food you choose will help you decide whether those foods will lead to weight gain or weight loss, and whether you want to consume more or less of them. Weight loss is usually a side effect of the Pritikin Eating Plan because of its focus on low calorie-dense foods.  Label Reading  Clinical staff conducted group or individual video education with verbal and written material and guidebook.  Patient learns about the Pritikin recommended label reading guidelines and corresponding recommendations regarding calorie density, added sugars, sodium content, and whole grains.  Dining  Out - Part 1  Clinical staff conducted group or individual video education with verbal and written material and guidebook.  Patient learns that restaurant meals can be sabotaging because they can be so high in calories, fat, sodium, and/or sugar. Patient learns recommended strategies on how to positively address this and avoid unhealthy pitfalls.  Facts on Fats  Clinical staff conducted group or individual video education with verbal and written material and guidebook.  Patient learns that lifestyle modifications can be just as effective, if not more so, as many medications for lowering your risk of heart disease. A Pritikin lifestyle can help to reduce your risk of inflammation and atherosclerosis (cholesterol build-up, or plaque, in the artery walls). Lifestyle interventions such as dietary choices and physical activity address the cause of atherosclerosis. A review of the types of fats and their impact on blood cholesterol levels, along with dietary recommendations to reduce fat intake is also included.  Nutrition Action Plan  Clinical staff conducted group or individual video education with verbal and written material and guidebook.  Patient learns how to incorporate Pritikin recommendations into their lifestyle. Recommendations include planning and keeping personal health goals in mind as an important part of their success.  Healthy Mind-Set    Healthy Minds, Bodies, Hearts  Clinical staff conducted group or individual video education with verbal and written material and guidebook.  Patient learns how to identify when they are stressed. Video will discuss the impact of that stress, as well as the many benefits of stress management. Patient will also be introduced to stress management techniques. The way we think, act, and feel has an impact on our hearts.  How Our Thoughts Can Heal Our Hearts  Clinical staff conducted group or individual video education with verbal and written material and  guidebook.  Patient learns that negative thoughts can cause depression and anxiety. This can result in negative lifestyle behavior and serious health problems. Cognitive behavioral therapy is an effective method to help control our thoughts in order to change and improve our emotional outlook.  Additional Videos:  Exercise    Improving Performance  Clinical staff conducted group or individual video education with verbal and written material and guidebook.  Patient learns to use a non-linear approach by alternating intensity levels and lengths of time spent exercising to help burn more calories and lose more body fat.  Cardiovascular exercise helps improve heart health, metabolism, hormonal balance, blood sugar control, and recovery from fatigue. Resistance training improves strength, endurance, balance, coordination, reaction time, metabolism, and muscle mass. Flexibility exercise improves circulation, posture, and balance. Seek guidance from your physician and exercise physiologist before implementing an exercise routine and learn your capabilities and proper form for all exercise.  Introduction to Yoga  Clinical staff conducted group or individual video education with verbal and written material and guidebook.  Patient learns about yoga, a discipline of the coming together of mind, breath, and body. The benefits of yoga include improved flexibility, improved range of motion, better posture and core strength, increased lung function, weight loss, and positive self-image. Yoga's heart health benefits include lowered blood pressure, healthier heart rate, decreased cholesterol and triglyceride levels, improved immune function, and reduced stress. Seek guidance from your physician and exercise physiologist before implementing an exercise routine and learn your capabilities and proper form for all exercise.  Medical   Aging: Enhancing Your Quality of Life  Clinical staff conducted group or individual  video education with verbal and written material and guidebook.  Patient learns key strategies and recommendations to stay in good physical health and enhance quality of life, such as prevention strategies, having an advocate, securing a Health Care Proxy and Power of Attorney, and keeping a list of medications and system for tracking them. It also discusses how to avoid risk for bone loss.  Biology of Weight Control  Clinical staff conducted group or individual video education with verbal and written material and guidebook.  Patient learns that weight gain occurs because we consume more calories than we burn (eating more, moving less). Even if your body weight is normal, you may have higher ratios of fat compared to muscle mass. Too much body fat puts you at increased risk for cardiovascular disease, heart attack, stroke, type 2 diabetes, and obesity-related cancers. In addition to exercise, following the Pritikin Eating Plan can help reduce your risk.  Decoding Lab Results  Clinical staff conducted group or individual video education with verbal and written material and guidebook.  Patient learns that lab test reflects one measurement whose values change over time and are influenced by many factors, including medication, stress, sleep, exercise, food, hydration, pre-existing medical conditions, and more. It is recommended to use the knowledge from this video to become more involved with your lab results and evaluate your numbers to speak with your doctor.   Diseases of Our Time - Overview  Clinical staff conducted group or individual video education with verbal and written material and guidebook.  Patient learns that according to the CDC, 50% to 70% of chronic diseases (such as obesity, type 2 diabetes, elevated lipids, hypertension, and heart disease) are avoidable through lifestyle improvements including healthier food choices, listening to satiety cues, and increased physical activity.  Sleep  Disorders Clinical staff conducted group or individual video education with verbal and written material and guidebook.  Patient learns how good quality and duration of sleep are important to overall health and well-being. Patient also learns about sleep disorders and how they impact health along with recommendations to address them, including discussing with a physician.  Nutrition  Dining Out - Part 2 Clinical staff conducted group or individual video education with verbal and written material and guidebook.  Patient learns how to plan ahead and communicate in order to maximize their dining experience in a healthy and nutritious manner. Included are recommended food choices based on the type of restaurant the  patient is visiting.   Fueling a Banker conducted group or individual video education with verbal and written material and guidebook.  There is a strong connection between our food choices and our health. Diseases like obesity and type 2 diabetes are very prevalent and are in large-part due to lifestyle choices. The Pritikin Eating Plan provides plenty of food and hunger-curbing satisfaction. It is easy to follow, affordable, and helps reduce health risks.  Menu Workshop  Clinical staff conducted group or individual video education with verbal and written material and guidebook.  Patient learns that restaurant meals can sabotage health goals because they are often packed with calories, fat, sodium, and sugar. Recommendations include strategies to plan ahead and to communicate with the manager, chef, or server to help order a healthier meal.  Planning Your Eating Strategy  Clinical staff conducted group or individual video education with verbal and written material and guidebook.  Patient learns about the Pritikin Eating Plan and its benefit of reducing the risk of disease. The Pritikin Eating Plan does not focus on calories. Instead, it emphasizes high-quality,  nutrient-rich foods. By knowing the characteristics of the foods, we choose, we can determine their calorie density and make informed decisions.  Targeting Your Nutrition Priorities  Clinical staff conducted group or individual video education with verbal and written material and guidebook.  Patient learns that lifestyle habits have a tremendous impact on disease risk and progression. This video provides eating and physical activity recommendations based on your personal health goals, such as reducing LDL cholesterol, losing weight, preventing or controlling type 2 diabetes, and reducing high blood pressure.  Vitamins and Minerals  Clinical staff conducted group or individual video education with verbal and written material and guidebook.  Patient learns different ways to obtain key vitamins and minerals, including through a recommended healthy diet. It is important to discuss all supplements you take with your doctor.   Healthy Mind-Set    Smoking Cessation  Clinical staff conducted group or individual video education with verbal and written material and guidebook.  Patient learns that cigarette smoking and tobacco addiction pose a serious health risk which affects millions of people. Stopping smoking will significantly reduce the risk of heart disease, lung disease, and many forms of cancer. Recommended strategies for quitting are covered, including working with your doctor to develop a successful plan.  Culinary   Becoming a Set designer conducted group or individual video education with verbal and written material and guidebook.  Patient learns that cooking at home can be healthy, cost-effective, quick, and puts them in control. Keys to cooking healthy recipes will include looking at your recipe, assessing your equipment needs, planning ahead, making it simple, choosing cost-effective seasonal ingredients, and limiting the use of added fats, salts, and sugars.  Cooking -  Breakfast and Snacks  Clinical staff conducted group or individual video education with verbal and written material and guidebook.  Patient learns how important breakfast is to satiety and nutrition through the entire day. Recommendations include key foods to eat during breakfast to help stabilize blood sugar levels and to prevent overeating at meals later in the day. Planning ahead is also a key component.  Cooking - Educational psychologist conducted group or individual video education with verbal and written material and guidebook.  Patient learns eating strategies to improve overall health, including an approach to cook more at home. Recommendations include thinking of animal protein as a side on your  plate rather than center stage and focusing instead on lower calorie dense options like vegetables, fruits, whole grains, and plant-based proteins, such as beans. Making sauces in large quantities to freeze for later and leaving the skin on your vegetables are also recommended to maximize your experience.  Cooking - Healthy Salads and Dressing Clinical staff conducted group or individual video education with verbal and written material and guidebook.  Patient learns that vegetables, fruits, whole grains, and legumes are the foundations of the Pritikin Eating Plan. Recommendations include how to incorporate each of these in flavorful and healthy salads, and how to create homemade salad dressings. Proper handling of ingredients is also covered. Cooking - Soups and [employer] - Soups and Desserts Clinical staff conducted group or individual video education with verbal and written material and guidebook.  Patient learns that Pritikin soups and desserts make for easy, nutritious, and delicious snacks and meal components that are low in sodium, fat, sugar, and calorie density, while high in vitamins, minerals, and filling fiber. Recommendations include simple and healthy ideas for soups and  desserts.   Overview     The Pritikin Solution Program Overview Clinical staff conducted group or individual video education with verbal and written material and guidebook.  Patient learns that the results of the Pritikin Program have been documented in more than 100 articles published in peer-reviewed journals, and the benefits include reducing risk factors for (and, in some cases, even reversing) high cholesterol, high blood pressure, type 2 diabetes, obesity, and more! An overview of the three key pillars of the Pritikin Program will be covered: eating well, doing regular exercise, and having a healthy mind-set.  WORKSHOPS  Exercise: Exercise Basics: Building Your Action Plan Clinical staff led group instruction and group discussion with PowerPoint presentation and patient guidebook. To enhance the learning environment the use of posters, models and videos may be added. At the conclusion of this workshop, patients will comprehend the difference between physical activity and exercise, as well as the benefits of incorporating both, into their routine. Patients will understand the FITT (Frequency, Intensity, Time, and Type) principle and how to use it to build an exercise action plan. In addition, safety concerns and other considerations for exercise and cardiac rehab will be addressed by the presenter. The purpose of this lesson is to promote a comprehensive and effective weekly exercise routine in order to improve patients' overall level of fitness.   Managing Heart Disease: Your Path to a Healthier Heart Clinical staff led group instruction and group discussion with PowerPoint presentation and patient guidebook. To enhance the learning environment the use of posters, models and videos may be added.At the conclusion of this workshop, patients will understand the anatomy and physiology of the heart. Additionally, they will understand how Pritikin's three pillars impact the risk factors, the  progression, and the management of heart disease.  The purpose of this lesson is to provide a high-level overview of the heart, heart disease, and how the Pritikin lifestyle positively impacts risk factors.  Exercise Biomechanics Clinical staff led group instruction and group discussion with PowerPoint presentation and patient guidebook. To enhance the learning environment the use of posters, models and videos may be added. Patients will learn how the structural parts of their bodies function and how these functions impact their daily activities, movement, and exercise. Patients will learn how to promote a neutral spine, learn how to manage pain, and identify ways to improve their physical movement in order to promote healthy living. The  purpose of this lesson is to expose patients to common physical limitations that impact physical activity. Participants will learn practical ways to adapt and manage aches and pains, and to minimize their effect on regular exercise. Patients will learn how to maintain good posture while sitting, walking, and lifting.  Balance Training and Fall Prevention  Clinical staff led group instruction and group discussion with PowerPoint presentation and patient guidebook. To enhance the learning environment the use of posters, models and videos may be added. At the conclusion of this workshop, patients will understand the importance of their sensorimotor skills (vision, proprioception, and the vestibular system) in maintaining their ability to balance as they age. Patients will apply a variety of balancing exercises that are appropriate for their current level of function. Patients will understand the common causes for poor balance, possible solutions to these problems, and ways to modify their physical environment in order to minimize their fall risk. The purpose of this lesson is to teach patients about the importance of maintaining balance as they age and ways to  minimize their risk of falling.  WORKSHOPS   Nutrition:  Fueling a Ship broker led group instruction and group discussion with PowerPoint presentation and patient guidebook. To enhance the learning environment the use of posters, models and videos may be added. Patients will review the foundational principles of the Pritikin Eating Plan and understand what constitutes a serving size in each of the food groups. Patients will also learn Pritikin-friendly foods that are better choices when away from home and review make-ahead meal and snack options. Calorie density will be reviewed and applied to three nutrition priorities: weight maintenance, weight loss, and weight gain. The purpose of this lesson is to reinforce (in a group setting) the key concepts around what patients are recommended to eat and how to apply these guidelines when away from home by planning and selecting Pritikin-friendly options. Patients will understand how calorie density may be adjusted for different weight management goals.  Mindful Eating  Clinical staff led group instruction and group discussion with PowerPoint presentation and patient guidebook. To enhance the learning environment the use of posters, models and videos may be added. Patients will briefly review the concepts of the Pritikin Eating Plan and the importance of low-calorie dense foods. The concept of mindful eating will be introduced as well as the importance of paying attention to internal hunger signals. Triggers for non-hunger eating and techniques for dealing with triggers will be explored. The purpose of this lesson is to provide patients with the opportunity to review the basic principles of the Pritikin Eating Plan, discuss the value of eating mindfully and how to measure internal cues of hunger and fullness using the Hunger Scale. Patients will also discuss reasons for non-hunger eating and learn strategies to use for controlling emotional  eating.  Targeting Your Nutrition Priorities Clinical staff led group instruction and group discussion with PowerPoint presentation and patient guidebook. To enhance the learning environment the use of posters, models and videos may be added. Patients will learn how to determine their genetic susceptibility to disease by reviewing their family history. Patients will gain insight into the importance of diet as part of an overall healthy lifestyle in mitigating the impact of genetics and other environmental insults. The purpose of this lesson is to provide patients with the opportunity to assess their personal nutrition priorities by looking at their family history, their own health history and current risk factors. Patients will also be able to discuss  ways of prioritizing and modifying the Pritikin Eating Plan for their highest risk areas  Menu  Clinical staff led group instruction and group discussion with PowerPoint presentation and patient guidebook. To enhance the learning environment the use of posters, models and videos may be added. Using menus brought in from [employer], or printed from [employer], patients will apply the Pritikin dining out guidelines that were presented in the [employer] video. Patients will also be able to practice these guidelines in a variety of provided scenarios. The purpose of this lesson is to provide patients with the opportunity to practice hands-on learning of the Pritikin Dining Out guidelines with actual menus and practice scenarios.  Label Reading Clinical staff led group instruction and group discussion with PowerPoint presentation and patient guidebook. To enhance the learning environment the use of posters, models and videos may be added. Patients will review and discuss the Pritikin label reading guidelines presented in Pritikin's Label Reading Educational series video. Using fool labels brought in from local grocery stores and  markets, patients will apply the label reading guidelines and determine if the packaged food meet the Pritikin guidelines. The purpose of this lesson is to provide patients with the opportunity to review, discuss, and practice hands-on learning of the Pritikin Label Reading guidelines with actual packaged food labels. Cooking School  Pritikin's [employer] are designed to teach patients ways to prepare quick, simple, and affordable recipes at home. The importance of nutrition's role in chronic disease risk reduction is reflected in its emphasis in the overall Pritikin program. By learning how to prepare essential core Pritikin Eating Plan recipes, patients will increase control over what they eat; be able to customize the flavor of foods without the use of added salt, sugar, or fat; and improve the quality of the food they consume. By learning a set of core recipes which are easily assembled, quickly prepared, and affordable, patients are more likely to prepare more healthy foods at home. These workshops focus on convenient breakfasts, simple entres, side dishes, and desserts which can be prepared with minimal effort and are consistent with nutrition recommendations for cardiovascular risk reduction. Cooking [employer] are taught by a Armed forces logistics/support/administrative officer (RD) who has been trained by the [employer]. The chef or RD has a clear understanding of the importance of minimizing - if not completely eliminating - added fat, sugar, and sodium in recipes. Throughout the series of Cooking School Workshop sessions, patients will learn about healthy ingredients and efficient methods of cooking to build confidence in their capability to prepare    Cooking School weekly topics:  Adding Flavor- Sodium-Free  Fast and Healthy Breakfasts  Powerhouse Plant-Based Proteins  Satisfying Salads and Dressings  Simple Sides and Sauces  International Cuisine-Spotlight on the [employer]  Zones  Delicious Desserts  Savory Soups  [employer] - Meals in a Astronomer Appetizers and Snacks  Comforting Weekend Breakfasts  One-Pot Wonders   Fast Evening Meals  Landscape architect Your Pritikin Plate  WORKSHOPS   Healthy Mindset (Psychosocial):  Focused Goals, Sustainable Changes Clinical staff led group instruction and group discussion with PowerPoint presentation and patient guidebook. To enhance the learning environment the use of posters, models and videos may be added. Patients will be able to apply effective goal setting strategies to establish at least one personal goal, and then take consistent, meaningful action toward that goal. They will learn to identify common barriers to achieving personal goals  and develop strategies to overcome them. Patients will also gain an understanding of how our mind-set can impact our ability to achieve goals and the importance of cultivating a positive and growth-oriented mind-set. The purpose of this lesson is to provide patients with a deeper understanding of how to set and achieve personal goals, as well as the tools and strategies needed to overcome common obstacles which may arise along the way.  From Head to Heart: The Power of a Healthy Outlook  Clinical staff led group instruction and group discussion with PowerPoint presentation and patient guidebook. To enhance the learning environment the use of posters, models and videos may be added. Patients will be able to recognize and describe the impact of emotions and mood on physical health. They will discover the importance of self-care and explore self-care practices which may work for them. Patients will also learn how to utilize the 4 C's to cultivate a healthier outlook and better manage stress and challenges. The purpose of this lesson is to demonstrate to patients how a healthy outlook is an essential part of maintaining good health, especially as they continue their  cardiac rehab journey.  Healthy Sleep for a Healthy Heart Clinical staff led group instruction and group discussion with PowerPoint presentation and patient guidebook. To enhance the learning environment the use of posters, models and videos may be added. At the conclusion of this workshop, patients will be able to demonstrate knowledge of the importance of sleep to overall health, well-being, and quality of life. They will understand the symptoms of, and treatments for, common sleep disorders. Patients will also be able to identify daytime and nighttime behaviors which impact sleep, and they will be able to apply these tools to help manage sleep-related challenges. The purpose of this lesson is to provide patients with a general overview of sleep and outline the importance of quality sleep. Patients will learn about a few of the most common sleep disorders. Patients will also be introduced to the concept of "sleep hygiene," and discover ways to self-manage certain sleeping problems through simple daily behavior changes. Finally, the workshop will motivate patients by clarifying the links between quality sleep and their goals of heart-healthy living.   Recognizing and Reducing Stress Clinical staff led group instruction and group discussion with PowerPoint presentation and patient guidebook. To enhance the learning environment the use of posters, models and videos may be added. At the conclusion of this workshop, patients will be able to understand the types of stress reactions, differentiate between acute and chronic stress, and recognize the impact that chronic stress has on their health. They will also be able to apply different coping mechanisms, such as reframing negative self-talk. Patients will have the opportunity to practice a variety of stress management techniques, such as deep abdominal breathing, progressive muscle relaxation, and/or guided imagery.  The purpose of this lesson is to educate  patients on the role of stress in their lives and to provide healthy techniques for coping with it.  Learning Barriers/Preferences:  Learning Barriers/Preferences - 10/28/23 1347       Learning Barriers/Preferences   Learning Barriers Exercise Concerns   some balance concerns and SOB   Learning Preferences Group Instruction;Skilled Demonstration;Written Material;Individual Instruction          Education Topics:  Knowledge Questionnaire Score:  Knowledge Questionnaire Score - 10/28/23 1348       Knowledge Questionnaire Score   Pre Score 23/24          Core Components/Risk Factors/Patient  Goals at Admission:  Personal Goals and Risk Factors at Admission - 10/28/23 1349       Core Components/Risk Factors/Patient Goals on Admission    Weight Management Yes    Intervention Weight Management: Develop a combined nutrition and exercise program designed to reach desired caloric intake, while maintaining appropriate intake of nutrient and fiber, sodium and fats, and appropriate energy expenditure required for the weight goal.;Weight Management: Provide education and appropriate resources to help participant work on and attain dietary goals.;Weight Management/Obesity: Establish reasonable short term and long term weight goals.;Obesity: Provide education and appropriate resources to help participant work on and attain dietary goals.    Expected Outcomes Short Term: Continue to assess and modify interventions until short term weight is achieved;Long Term: Adherence to nutrition and physical activity/exercise program aimed toward attainment of established weight goal;Weight Maintenance: Understanding of the daily nutrition guidelines, which includes 25-35% calories from fat, 7% or less cal from saturated fats, less than 200mg  cholesterol, less than 1.5gm of sodium, & 5 or more servings of fruits and vegetables daily;Understanding recommendations for meals to include 15-35% energy as protein,  25-35% energy from fat, 35-60% energy from carbohydrates, less than 200mg  of dietary cholesterol, 20-35 gm of total fiber daily;Understanding of distribution of calorie intake throughout the day with the consumption of 4-5 meals/snacks    Diabetes Yes    Intervention Provide education about signs/symptoms and action to take for hypo/hyperglycemia.;Provide education about proper nutrition, including hydration, and aerobic/resistive exercise prescription along with prescribed medications to achieve blood glucose in normal ranges: Fasting glucose 65-99 mg/dL    Expected Outcomes Short Term: Participant verbalizes understanding of the signs/symptoms and immediate care of hyper/hypoglycemia, proper foot care and importance of medication, aerobic/resistive exercise and nutrition plan for blood glucose control.;Long Term: Attainment of HbA1C < 7%.    Hypertension Yes    Intervention Provide education on lifestyle modifcations including regular physical activity/exercise, weight management, moderate sodium restriction and increased consumption of fresh fruit, vegetables, and low fat dairy, alcohol moderation, and smoking cessation.;Monitor prescription use compliance.    Expected Outcomes Short Term: Continued assessment and intervention until BP is < 140/59mm HG in hypertensive participants. < 130/68mm HG in hypertensive participants with diabetes, heart failure or chronic kidney disease.;Long Term: Maintenance of blood pressure at goal levels.    Lipids Yes    Intervention Provide education and support for participant on nutrition & aerobic/resistive exercise along with prescribed medications to achieve LDL 70mg , HDL >40mg .    Expected Outcomes Short Term: Participant states understanding of desired cholesterol values and is compliant with medications prescribed. Participant is following exercise prescription and nutrition guidelines.;Long Term: Cholesterol controlled with medications as prescribed, with  individualized exercise RX and with personalized nutrition plan. Value goals: LDL < 70mg , HDL > 40 mg.    Personal Goal Other Yes    Personal Goal Pt would like to increase strength, control SOB and get back on TM and bike    Intervention Will continue to monitor pt and progress workloads as tolerated    Expected Outcomes Pt will achieve his goals          Core Components/Risk Factors/Patient Goals Review:   Goals and Risk Factor Review     Row Name 11/11/23 0830 12/07/23 1139 01/07/24 0923         Core Components/Risk Factors/Patient Goals Review   Personal Goals Review Weight Management/Obesity;Hypertension;Lipids;Diabetes Weight Management/Obesity;Hypertension;Lipids;Diabetes Weight Management/Obesity;Hypertension;Lipids;Diabetes     Review Manveer started exercise at cardiac rehab on 11/03/23.  Granger is off to a good start to exercise. Omare's vital signs have been stable. Riceky is not on any diabetic medications at this time. Tige is doing wel with exercise at cardiac rehab.. Enrique's vital signs have been stable. Daking has increased his met levels. Jagar is doing well with exercise at cardiac rehab.  Vital signs have been stable. Tarus has increased his met levels.     Expected Outcomes Mitul will continue to participate in cardiac rehab for exercise, nutrition and lifestylle modifications Adriane will continue to participate in cardiac rehab for exercise, nutrition and lifestylle modifications Wilmer will continue to participate in cardiac rehab for exercise, nutrition and lifestyle modifications        Core Components/Risk Factors/Patient Goals at Discharge (Final Review):   Goals and Risk Factor Review - 01/07/24 0923       Core Components/Risk Factors/Patient Goals Review   Personal Goals Review Weight Management/Obesity;Hypertension;Lipids;Diabetes    Review Jeren is doing well with exercise at cardiac rehab.  Vital signs have been stable. Vance has increased his met  levels.    Expected Outcomes Shamarr will continue to participate in cardiac rehab for exercise, nutrition and lifestyle modifications          ITP Comments:  ITP Comments     Row Name 10/28/23 1233 11/11/23 0824 12/07/23 1137 01/07/24 0919     ITP Comments Introduction to [facility]/Intensive cardiac Rehab. Initial Orientation Packet Reviewed with the patient. 30 Day ITP Review. Danzell started cardiac rehab on 11/03/23. Nandan is off to a good start with exercise. 30 Day ITP Review. Kendan has good participation in cardiac rehab when in attendance. 30 Day ITP Review. Garnett has good participation in cardiac rehab when in attendance.       Comments: See ITP comments

## 2024-01-10 ENCOUNTER — Encounter (HOSPITAL_COMMUNITY)

## 2024-01-12 ENCOUNTER — Encounter (HOSPITAL_COMMUNITY)

## 2024-01-14 ENCOUNTER — Encounter (HOSPITAL_COMMUNITY)

## 2024-01-14 ENCOUNTER — Encounter (HOSPITAL_COMMUNITY)
Admission: RE | Admit: 2024-01-14 | Discharge: 2024-01-14 | Disposition: A | Discharge status: Home / Self Care | Source: Ambulatory Visit | Attending: Cardiology | Admitting: Cardiology

## 2024-01-14 DIAGNOSIS — Z951 Presence of aortocoronary bypass graft: Secondary | ICD-10-CM

## 2024-01-14 DIAGNOSIS — Z48812 Encounter for surgical aftercare following surgery on the circulatory system: Secondary | ICD-10-CM | POA: Diagnosis not present

## 2024-01-17 ENCOUNTER — Encounter (HOSPITAL_COMMUNITY)
Admission: RE | Admit: 2024-01-17 | Discharge: 2024-01-17 | Disposition: A | Discharge status: Home / Self Care | Source: Ambulatory Visit | Attending: Cardiology

## 2024-01-17 DIAGNOSIS — Z951 Presence of aortocoronary bypass graft: Secondary | ICD-10-CM

## 2024-01-17 DIAGNOSIS — Z48812 Encounter for surgical aftercare following surgery on the circulatory system: Secondary | ICD-10-CM | POA: Diagnosis not present

## 2024-01-19 ENCOUNTER — Encounter (HOSPITAL_COMMUNITY)
Admission: RE | Admit: 2024-01-19 | Discharge: 2024-01-19 | Disposition: A | Discharge status: Home / Self Care | Source: Ambulatory Visit | Attending: Cardiology

## 2024-01-19 DIAGNOSIS — Z48812 Encounter for surgical aftercare following surgery on the circulatory system: Secondary | ICD-10-CM | POA: Diagnosis not present

## 2024-01-19 DIAGNOSIS — Z951 Presence of aortocoronary bypass graft: Secondary | ICD-10-CM

## 2024-01-21 ENCOUNTER — Encounter (HOSPITAL_COMMUNITY)
Admission: RE | Admit: 2024-01-21 | Discharge: 2024-01-21 | Disposition: A | Discharge status: Home / Self Care | Source: Ambulatory Visit | Attending: Cardiology | Admitting: Cardiology

## 2024-01-21 DIAGNOSIS — Z48812 Encounter for surgical aftercare following surgery on the circulatory system: Secondary | ICD-10-CM | POA: Diagnosis not present

## 2024-01-21 DIAGNOSIS — Z951 Presence of aortocoronary bypass graft: Secondary | ICD-10-CM

## 2024-01-26 ENCOUNTER — Encounter (HOSPITAL_COMMUNITY)
Admission: RE | Admit: 2024-01-26 | Discharge: 2024-01-26 | Disposition: A | Discharge status: Home / Self Care | Source: Ambulatory Visit | Attending: Cardiology

## 2024-01-26 DIAGNOSIS — Z951 Presence of aortocoronary bypass graft: Secondary | ICD-10-CM

## 2024-01-26 DIAGNOSIS — Z48812 Encounter for surgical aftercare following surgery on the circulatory system: Secondary | ICD-10-CM | POA: Diagnosis not present

## 2024-01-28 ENCOUNTER — Encounter (HOSPITAL_COMMUNITY)
Admission: RE | Admit: 2024-01-28 | Discharge: 2024-01-28 | Disposition: A | Discharge status: Home / Self Care | Source: Ambulatory Visit | Attending: Cardiology | Admitting: Cardiology

## 2024-01-28 DIAGNOSIS — Z951 Presence of aortocoronary bypass graft: Secondary | ICD-10-CM

## 2024-01-28 DIAGNOSIS — Z48812 Encounter for surgical aftercare following surgery on the circulatory system: Secondary | ICD-10-CM | POA: Diagnosis not present

## 2024-02-02 ENCOUNTER — Encounter (HOSPITAL_COMMUNITY)
Admission: RE | Admit: 2024-02-02 | Discharge: 2024-02-02 | Disposition: A | Discharge status: Home / Self Care | Source: Ambulatory Visit | Attending: Cardiology | Admitting: Cardiology

## 2024-02-02 VITALS — Ht 69.25 in | Wt 211.0 lb

## 2024-02-02 DIAGNOSIS — Z48812 Encounter for surgical aftercare following surgery on the circulatory system: Secondary | ICD-10-CM | POA: Insufficient documentation

## 2024-02-02 DIAGNOSIS — Z951 Presence of aortocoronary bypass graft: Secondary | ICD-10-CM | POA: Insufficient documentation

## 2024-02-02 NOTE — Progress Notes (Signed)
 Discharge Progress Report  Patient Details  Name: Kevin Yang MRN: [id number] Date of Birth: Nov 24, 1950 Referring Provider:   Flowsheet Row INTENSIVE CARDIAC REHAB ORIENT from 10/28/2023 in [facility]  Referring Provider Dr. Ripley (Dr. Wilbert Bihari covering)     Number of Visits: 73  Reason for Discharge:  Patient reached a stable level of exercise. Patient independent in their exercise. Patient has met program and personal goals.  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 Ripley (Dr. Wilbert Bihari covering)    Expected Discharge Date 01/19/24      Treadmill   MPH 2.5    Grade 0    Minutes 15    METs 2.91      Recumbant Bike   Level 2    RPM 35    Watts 50    Minutes 15    METs 2.1      Prescription Details   Frequency (times per week) 3    Duration Progress to 30 minutes of continuous aerobic without signs/symptoms of physical distress      Intensity   THRR 40-80% of Max Heartrate 60-119    Ratings of Perceived Exertion 11-13    Perceived Dyspnea 0-4      Progression   Progression Continue progressive overload as per policy without signs/symptoms or physical distress.      Resistance Training   Training Prescription Yes    Weight 3    Reps 10-15          Discharge Exercise Prescription (Final Exercise Prescription Changes):  Exercise Prescription Changes - 01/14/24 0840       Response to Exercise   Blood Pressure (Admit) 130/64    Blood Pressure (Exit) 138/80    Heart Rate (Admit) 62 bpm     Heart Rate (Exercise) 97 bpm    Heart Rate (Exit) 72 bpm    Rating of Perceived Exertion (Exercise) 12    Perceived Dyspnea (Exercise) 1    Symptoms none    Comments Reviewed MET's and goal    Duration Progress to 30 minutes of  aerobic without signs/symptoms of physical distress    Intensity THRR unchanged      Progression   Progression Continue to progress workloads to maintain intensity without signs/symptoms of physical distress.    Average METs 3.57      Resistance Training   Training Prescription Yes    Weight 3    Reps 10-15    Time 10 Minutes      Treadmill   MPH 2.5    Grade 1.5    Minutes 15    METs 3.43      Recumbant Bike   Level 3    RPM 80    Watts 48    Minutes 15    METs 3.7      Home Exercise Plan   Plans to continue exercise at Home (comment)    Frequency Add 2 additional days to program exercise sessions.  Initial Home Exercises Provided 11/15/23          Functional Capacity:  6 Minute Walk     Row Name 10/28/23 1335 02/02/24 0831       6 Minute Walk   Phase Initial Discharge    Distance 1405 feet 1560 feet    Distance % Change -- 11.03 %    Distance Feet Change -- 155 ft    Walk Time 6 minutes 6 minutes    # of Rest Breaks 0 0    MPH 2.66 2.95    METS 2.99 3.24    RPE 12 12    Perceived Dyspnea  2 1    VO2 Peak 10.46 11.35    Symptoms Yes (comment) No    Comments Chronic low back pain 4/10 --    Resting HR 73 bpm 70 bpm    Resting BP 148/80 118/76    Resting Oxygen Saturation  95 % --    Exercise Oxygen Saturation  during 6 min walk 93 % --    Max Ex. HR 92 bpm 93 bpm    Max Ex. BP 168/88 130/64    2 Minute Post BP 158/78 --       Psychological, QOL, Others - Outcomes: PHQ 2/9:    02/02/2024    7:38 AM 10/28/2023    1:31 PM 06/12/2021    8:04 AM 05/05/2021    7:32 AM 04/07/2021    7:31 AM  Depression screen PHQ 2/9  Decreased Interest 1 3 0 2 1  Down, Depressed, Hopeless 0 0 0 0 1  PHQ - 2 Score 1 3 0 2 2  Altered  sleeping 3 3 2  0 2  Tired, decreased energy 1 2 2 1 3   Change in appetite 0 2 1 0 0  Feeling bad or failure about yourself  0 0 0 0 0  Trouble concentrating 0 1 0 0 0  Moving slowly or fidgety/restless 0 0 0 0 0  Suicidal thoughts 0 0 0 0 0  PHQ-9 Score 5 11 5 3 7   Difficult doing work/chores Somewhat difficult Somewhat difficult Somewhat difficult Somewhat difficult Somewhat difficult    Quality of Life:  Quality of Life - 02/02/24 0932       Quality of Life   Select Quality of Life      Quality of Life Scores   Health/Function Post 20.13 %    Socioeconomic Post 22.07 %    Psych/Spiritual Post 25.29 %    Family Post 23.4 %    GLOBAL Post 22.07 %          Personal Goals: Goals established at orientation with interventions provided to work toward goal.  Personal Goals and Risk Factors at Admission - 10/28/23 1349       Core Components/Risk Factors/Patient Goals on Admission    Weight Management Yes    Intervention Weight Management: Develop a combined nutrition and exercise program designed to reach desired caloric intake, while maintaining appropriate intake of nutrient and fiber, sodium and fats, and appropriate energy expenditure required for the weight goal.;Weight Management: Provide education and appropriate resources to help participant work on and attain dietary goals.;Weight Management/Obesity: Establish reasonable short term and long term weight goals.;Obesity: Provide education and appropriate resources to help participant work on and attain dietary goals.    Expected Outcomes Short Term: Continue to assess and modify interventions until short term weight is achieved;Long Term: Adherence to nutrition and physical activity/exercise program aimed toward  attainment of established weight goal;Weight Maintenance: Understanding of the daily nutrition guidelines, which includes 25-35% calories from fat, 7% or less cal from saturated fats, less than 200mg  cholesterol, less than  1.5gm of sodium, & 5 or more servings of fruits and vegetables daily;Understanding recommendations for meals to include 15-35% energy as protein, 25-35% energy from fat, 35-60% energy from carbohydrates, less than 200mg  of dietary cholesterol, 20-35 gm of total fiber daily;Understanding of distribution of calorie intake throughout the day with the consumption of 4-5 meals/snacks    Diabetes Yes    Intervention Provide education about signs/symptoms and action to take for hypo/hyperglycemia.;Provide education about proper nutrition, including hydration, and aerobic/resistive exercise prescription along with prescribed medications to achieve blood glucose in normal ranges: Fasting glucose 65-99 mg/dL    Expected Outcomes Short Term: Participant verbalizes understanding of the signs/symptoms and immediate care of hyper/hypoglycemia, proper foot care and importance of medication, aerobic/resistive exercise and nutrition plan for blood glucose control.;Long Term: Attainment of HbA1C < 7%.    Hypertension Yes    Intervention Provide education on lifestyle modifcations including regular physical activity/exercise, weight management, moderate sodium restriction and increased consumption of fresh fruit, vegetables, and low fat dairy, alcohol moderation, and smoking cessation.;Monitor prescription use compliance.    Expected Outcomes Short Term: Continued assessment and intervention until BP is < 140/15mm HG in hypertensive participants. < 130/8mm HG in hypertensive participants with diabetes, heart failure or chronic kidney disease.;Long Term: Maintenance of blood pressure at goal levels.    Lipids Yes    Intervention Provide education and support for participant on nutrition & aerobic/resistive exercise along with prescribed medications to achieve LDL 70mg , HDL >40mg .    Expected Outcomes Short Term: Participant states understanding of desired cholesterol values and is compliant with medications prescribed.  Participant is following exercise prescription and nutrition guidelines.;Long Term: Cholesterol controlled with medications as prescribed, with individualized exercise RX and with personalized nutrition plan. Value goals: LDL < 70mg , HDL > 40 mg.    Personal Goal Other Yes    Personal Goal Pt would like to increase strength, control SOB and get back on TM and bike    Intervention Will continue to monitor pt and progress workloads as tolerated    Expected Outcomes Pt will achieve his goals           Personal Goals Discharge:  Goals and Risk Factor Review     Row Name 11/11/23 0830 12/07/23 1139 01/07/24 0923         Core Components/Risk Factors/Patient Goals Review   Personal Goals Review Weight Management/Obesity;Hypertension;Lipids;Diabetes Weight Management/Obesity;Hypertension;Lipids;Diabetes Weight Management/Obesity;Hypertension;Lipids;Diabetes     Review Kevin Yang started exercise at cardiac rehab on 11/03/23. Kevin Yang is off to a good start to exercise. Kevin Yang's vital signs have been stable. Kevin Yang is not on any diabetic medications at this time. Kevin Yang is doing wel with exercise at cardiac rehab.. Kevin Yang vital signs have been stable. Kevin Yang has increased his met levels. Kevin Yang is doing well with exercise at cardiac rehab.  Vital signs have been stable. Kevin Yang has increased his met levels.     Expected Outcomes Kevin Yang will continue to participate in cardiac rehab for exercise, nutrition and lifestylle modifications Kevin Yang will continue to participate in cardiac rehab for exercise, nutrition and lifestylle modifications Kevin Yang will continue to participate in cardiac rehab for exercise, nutrition and lifestyle modifications        Exercise Goals and Review:  Exercise Goals     Row Name 10/28/23 1343  Exercise Goals   Increase Physical Activity Yes       Intervention Provide advice, education, support and counseling about physical activity/exercise needs.;Develop an  individualized exercise prescription for aerobic and resistive training based on initial evaluation findings, risk stratification, comorbidities and participant's personal goals.       Expected Outcomes Short Term: Attend rehab on a regular basis to increase amount of physical activity.;Long Term: Add in home exercise to make exercise part of routine and to increase amount of physical activity.;Long Term: Exercising regularly at least 3-5 days a week.       Increase Strength and Stamina Yes       Intervention Provide advice, education, support and counseling about physical activity/exercise needs.;Develop an individualized exercise prescription for aerobic and resistive training based on initial evaluation findings, risk stratification, comorbidities and participant's personal goals.       Expected Outcomes Short Term: Increase workloads from initial exercise prescription for resistance, speed, and METs.;Short Term: Perform resistance training exercises routinely during rehab and add in resistance training at home;Long Term: Improve cardiorespiratory fitness, muscular endurance and strength as measured by increased METs and functional capacity ( )       Able to understand and use rate of perceived exertion (RPE) scale Yes       Intervention Provide education and explanation on how to use RPE scale       Expected Outcomes Short Term: Able to use RPE daily in rehab to express subjective intensity level;Long Term:  Able to use RPE to guide intensity level when exercising independently       Able to understand and use Dyspnea scale Yes       Intervention Provide education and explanation on how to use Dyspnea scale       Expected Outcomes Short Term: Able to use Dyspnea scale daily in rehab to express subjective sense of shortness of breath during exertion;Long Term: Able to use Dyspnea scale to guide intensity level when exercising independently       Knowledge and understanding of Target Heart Rate Range  (THRR) Yes       Intervention Provide education and explanation of THRR including how the numbers were predicted and where they are located for reference       Expected Outcomes Short Term: Able to state/look up THRR;Short Term: Able to use daily as guideline for intensity in rehab;Long Term: Able to use THRR to govern intensity when exercising independently       Understanding of Exercise Prescription Yes       Intervention Provide education, explanation, and written materials on patient's individual exercise prescription       Expected Outcomes Short Term: Able to explain program exercise prescription;Long Term: Able to explain home exercise prescription to exercise independently          Exercise Goals Re-Evaluation:  Exercise Goals Re-Evaluation     Row Name 11/01/23 0830 11/15/23 0930 12/15/23 0850 01/14/24 0842 02/02/24 0840     Exercise Goal Re-Evaluation   Exercise Goals Review Increase Physical Activity;Understanding of Exercise Prescription;Increase Strength and Stamina;Knowledge and understanding of Target Heart Rate Range (THRR);Able to understand and use rate of perceived exertion (RPE) scale Increase Physical Activity;Understanding of Exercise Prescription;Increase Strength and Stamina;Knowledge and understanding of Target Heart Rate Range (THRR);Able to understand and use rate of perceived exertion (RPE) scale Increase Physical Activity;Understanding of Exercise Prescription;Increase Strength and Stamina;Knowledge and understanding of Target Heart Rate Range (THRR);Able to understand and use rate of perceived exertion (RPE) scale  Increase Physical Activity;Understanding of Exercise Prescription;Increase Strength and Stamina;Knowledge and understanding of Target Heart Rate Range (THRR);Able to understand and use rate of perceived exertion (RPE) scale Increase Physical Activity;Understanding of Exercise Prescription;Increase Strength and Stamina;Knowledge and understanding of Target Heart  Rate Range (THRR);Able to understand and use rate of perceived exertion (RPE) scale   Comments Pt first day in the [facility] program. Pt tolerated exercise well with an average MET level of 2.46. Pt is leanring his THRR, RPE and ExRx. He's off to a great start Reviewed MET's, goals and home ExRx. Pt tolerated exercise well with an average MET level of 2.56. Pt is feeling good about his goals and is already feeling an increase in strength and a decrease in SOB. He will continue to exercise by walking and using his TM at home 2 days for 30 mins. Reviewed MET's and goals. Pt tolerated exercise well with an average MET level of 3.38. Pt is doing well and progressing MET's and strength. He says he doesnt even notice his SOB with exercise anymore, but will have flair up occasionally some times with the heat. Reviewed MET's and goals. Pt tolerated exercise well with an average MET level of 3.57. Patietn is feeling good about exercise. He just got back from a trip and he's had some medical needs for his family recently that has been hard on him. But overall doing well and in good spirits. Pt graduated the [employer]. Pt tolerated exercise well with an average MET level of 3.24. He did very well in the program and has a good program set up to continue his progress. He will continue to exercise at [facility] in [place] and walking 3-5 days for 30-60 mins. He did well on his post walk test and increased by 155 ft for a total of 1590ft.   Expected Outcomes Pt will continue to exercise and increase WL as tolerated Pt will continue to exercise and increase WL as tolerated Pt will continue to exercise and increase WL as tolerated Pt will continue to exercise and increase WL as tolerated Pt will continue to exercise and increase WL as tolerated      Nutrition & Weight - Outcomes:  Pre Biometrics - 10/28/23 1334       Pre Biometrics   Height 5' 9.25 (1.759 m)    Waist Circumference 45 inches    Hip  Circumference 41 inches    Waist to Hip Ratio 1.1 %    BMI (Calculated) 31.61    Triceps Skinfold 15 mm    % Body Fat 31.3 %    Grip Strength 21 kg    Flexibility 10.5 in    Single Leg Stand 22 seconds          Post Biometrics - 02/02/24 0836        Post  Biometrics   Height 5' 9.25 (1.759 m)    Weight 210 lb 15.7 oz (95.7 kg)    Waist Circumference 43.5 inches    Hip Circumference 41 inches    Waist to Hip Ratio 1.06 %    BMI (Calculated) 30.93    Triceps Skinfold 15 mm    % Body Fat 30.4 %    Grip Strength 30 kg    Flexibility 15 in    Single Leg Stand 26 seconds          Nutrition:  Nutrition Therapy & Goals - 12/31/23 0859       Nutrition Therapy   Diet Heart  Healthy Diet    Drug/Food Interactions Statins/Certain Fruits      Personal Nutrition Goals   Nutrition Goal Patient to identify strategies for reducing cardiovascular risk by attending the [facility] education and nutrition series weekly.   Goal in progress.   Personal Goal #2 Patient to improve diet quality by using the plate method as a guide for meal planning to include lean protein/plant protein, fruits, vegetables, whole grains, nonfat dairy as part of a well-balanced diet.   Goal in progress.   Comments Goal in progress. Patient has medical history of CAD, COPD, Crohn's disease, diverticulitis, DM2, HTN, OSA, CABGx4. He has previously completed pulmonary rehab in 2022 and 2019. LDL is not at goal. A1c is well controlled at this time. He is down 3.3# since starting with our program. He does continue to attend the [facility] education/nutrition series regularly.  Patient will continue to benefit from participation in intensive cardiac rehab for nutrition education, exercise, and lifestyle modification.      Intervention Plan   Intervention Prescribe, educate and counsel regarding individualized specific dietary modifications aiming towards targeted core components such as weight, hypertension, lipid  management, diabetes, heart failure and other comorbidities.;Nutrition handout(s) given to patient.    Expected Outcomes Short Term Goal: Understand basic principles of dietary content, such as calories, fat, sodium, cholesterol and nutrients.;Long Term Goal: Adherence to prescribed nutrition plan.          Nutrition Discharge:   Education Questionnaire Score:  Knowledge Questionnaire Score - 02/02/24 0934       Knowledge Questionnaire Score   Post Score 21/24         Pt graduates from  Intensive/Traditional cardiac rehab program today with completion of 59 exercise and education sessions. Pt maintained good attendance and progressed nicely during their participation in rehab as evidenced by increased MET level.   Medication list reconciled. Repeat  PHQ score  5.  Pt has made significant lifestyle changes and should be commended for their success.  Danile achieved his goals during cardiac rehab.   Pt plans to continue exercise at [facility] 3-4x week.  Goals reviewed with patient; copy given to patient.

## 2024-04-30 ENCOUNTER — Encounter (HOSPITAL_COMMUNITY): Payer: Self-pay | Admitting: *Deleted

## 2024-04-30 ENCOUNTER — Emergency Department (HOSPITAL_COMMUNITY)
Admission: EM | Admit: 2024-04-30 | Discharge: 2024-04-30 | Disposition: A | Discharge status: Home / Self Care | Attending: Emergency Medicine | Admitting: Emergency Medicine

## 2024-04-30 ENCOUNTER — Other Ambulatory Visit: Payer: Self-pay

## 2024-04-30 DIAGNOSIS — J029 Acute pharyngitis, unspecified: Secondary | ICD-10-CM | POA: Insufficient documentation

## 2024-04-30 DIAGNOSIS — R509 Fever, unspecified: Secondary | ICD-10-CM | POA: Insufficient documentation

## 2024-04-30 DIAGNOSIS — J449 Chronic obstructive pulmonary disease, unspecified: Secondary | ICD-10-CM | POA: Insufficient documentation

## 2024-04-30 LAB — RESP PANEL BY RT-PCR (RSV, FLU A&B, COVID)  RVPGX2
Influenza A by PCR: NEGATIVE
Influenza B by PCR: NEGATIVE
Resp Syncytial Virus by PCR: NEGATIVE
SARS Coronavirus 2 by RT PCR: NEGATIVE

## 2024-04-30 LAB — GROUP A STREP BY PCR: Group A Strep by PCR: NOT DETECTED

## 2024-04-30 MED ORDER — ALUM & MAG HYDROXIDE-SIMETH 200-200-20 MG/5ML PO SUSP
30.0000 mL | Freq: Once | ORAL | Status: AC
  Administered 2024-04-30: 30 mL via ORAL
  Filled 2024-04-30: qty 30

## 2024-04-30 MED ORDER — LIDOCAINE VISCOUS HCL 2 % MT SOLN
15.0000 mL | Freq: Once | OROMUCOSAL | Status: AC
  Administered 2024-04-30: 15 mL via OROMUCOSAL
  Filled 2024-04-30: qty 15

## 2024-04-30 MED ORDER — ACETAMINOPHEN 500 MG PO TABS
1000.0000 mg | ORAL_TABLET | Freq: Once | ORAL | Status: AC
  Administered 2024-04-30: 1000 mg via ORAL
  Filled 2024-04-30: qty 2

## 2024-04-30 MED ORDER — IBUPROFEN 200 MG PO TABS
600.0000 mg | ORAL_TABLET | Freq: Once | ORAL | Status: AC
  Administered 2024-04-30: 600 mg via ORAL
  Filled 2024-04-30: qty 3

## 2024-04-30 NOTE — ED Provider Notes (Signed)
 [place] EMERGENCY DEPARTMENT AT [facility] Provider Note   CSN: [id number] Arrival date & time: 04/30/24  9448     Patient presents with: Generalized Body Aches   Kevin Yang is a 73 y.o. male with h/o COPD, ulcerative colitis, prediabetes presents emerged from today for evaluation of sore throat, runny nose, nasal congestion, and postnasal drip since yesterday.  Denies any fever, cough, chest pain, or shortness of breath.  Denies any difficulty swallowing but just has pain with doing so.  No medication taken prior to arrival.  Was around a large group of people for Thanksgiving however no known sick contacts.  He denies any allergies to any medications.  Denies any tobacco, EtOH illicit drug use.  HPI     Prior to Admission medications   Medication Sig Start Date End Date Taking? Authorizing Provider  albuterol  (PROVENTIL  HFA;VENTOLIN  HFA) 108 (90 BASE) MCG/ACT inhaler Inhale 2 puffs into the lungs every 6 (six) hours as needed for wheezing or shortness of breath.     [provider]  atorvastatin (LIPITOR) 80 MG tablet TAKE ONE TABLET BY MOUTH AT BEDTIME FOR CHOLESTEROL 01/15/21   [provider]  Benzoyl Peroxide 10 % LIQD WASH AS DIRECTED AFFECTED AREA DAILY : APPLY TO THE SCALP, CHEST AND BACK DAILY IN THE SHOWER. RINSE OFF COMPLETELY. DRY OFF WITH WHITE TOWELS SINCE THIS WASH CAN BLEACH COLORS Patient not taking: Reported on 02/02/2024 07/09/20   [provider]  budesonide-formoterol (SYMBICORT) 160-4.5 MCG/ACT inhaler Inhale 2 puffs into the lungs 2 (two) times daily.    [facility]  Carboxymethylcellulose Sodium 1 % GEL APPLY 1 DROP TO EACH EYE FOUR TIMES A DAY Patient taking differently: Place 1 drop into both eyes 4 (four) times daily as needed (takes between 1-4 drops each day as needed). 06/19/20   [provider]  cetirizine (ZYRTEC) 10 MG tablet Take 1 tablet by mouth daily. 06/19/20   [provider]   diclofenac Sodium (VOLTAREN) 1 % GEL APPLY 4 GRAMS TO AFFECTED AREA FOUR TIMES A DAY AS NEEDED 06/19/20   [provider]  dicyclomine (BENTYL) 10 MG capsule TAKE ONE CAPSULE BY MOUTH TWICE A DAY BEFORE MEALS 06/19/20   [provider]  fluticasone (FLONASE) 50 MCG/ACT nasal spray Place 1 spray into both nostrils as needed. 2 sprays each nostril once a day 09/22/11   [provider]  formoterol (PERFOROMIST) 20 MCG/2ML nebulizer solution Take 20 mcg by nebulization 4 (four) times daily. Patient taking differently: Take 20 mcg by nebulization 4 (four) times daily as needed (taking PRN when COPD flares up).    [provider]  HYDROcodone-acetaminophen  (NORCO) 10-325 MG tablet TAKE ONE-HALF TO ONE TABLET BY MOUTH TWICE A DAY AS NEEDED (9/23M,10/89M) 03/11/21   [provider]  inFLIXimab-abda 100 MG SOLR Inject into the vein every 6 (six) weeks.    [facility]  ipratropium-albuterol  (DUONEB) 0.5-2.5 (3) MG/3ML SOLN Inhale 3 mLs into the lungs every 4 (four) hours as needed (for wheezing).    [provider]  lisinopril (ZESTRIL) 10 MG tablet Take 1 tablet by mouth daily. 11/09/20   [provider]  loperamide (IMODIUM) 2 MG capsule TAKE ONE CAPSULE BY MOUTH AS DIRECTED BY YOUR PROVIDER AS NEEDED    AFTER EACH LOOSE STOOL NO MORE THAN 4 TIMES PER DAY 02/17/21   [provider]  naloxone (NARCAN) nasal spray 4 mg/0.1 mL SPRAY 1 SPRAY INTO ONE NOSTRIL AS DIRECTED FOR  OPIOID OVERDOSE (TURN PERSON ON SIDE AFTER DOSE. IF NO RESPONSE IN 2-3 MINUTES OR PERSON RESPONDS BUT RELAPSES, REPEAT USING A NEW SPRAY DEVICE AND SPRAY INTO THE OTHER NOSTRIL. CALL 911 AFTER USE.) * EMERGENCY USE ONLY * 01/15/21   [provider]  omeprazole (PRILOSEC) 40 MG capsule TAKE ONE CAPSULE BY MOUTH DAILY (TAKE ON AN EMPTY STOMACH 30 MINUTES PRIOR TO A MEAL) Patient not taking: Reported on 02/02/2024 07/04/20   [provider]  predniSONE   (DELTASONE ) 10 MG tablet TAKE TWO TABLETS BY MOUTH IN THE MORNING -CONTINUE THE 20 MG DAILY UNTIL FURTHER INSTRUCTED (TAKE WITH FOOD) Patient not taking: Reported on 02/02/2024 02/27/21   [provider]  sildenafil  (VIAGRA ) 50 MG tablet Take 1 tablet (50 mg total) by mouth as needed for erectile dysfunction. 10/09/15   Jame Maude FALCON, MD  simvastatin (ZOCOR) 80 MG tablet Take 80 mg by mouth at bedtime.    [provider]  tamsulosin (FLOMAX) 0.4 MG CAPS Take 0.4 mg by mouth at bedtime.    [provider]    Allergies: Penicillins    Review of Systems  Constitutional:  Negative for chills and fever.  HENT:  Positive for congestion, postnasal drip, sinus pressure and sore throat. Negative for ear pain.   Respiratory:  Negative for cough and shortness of breath.   Neurological:  Negative for headaches.    Updated Vital Signs BP (!) 160/95 (BP Location: Left Arm)   Pulse 98   Temp 99.2 F (37.3 C) (Oral)   Resp 16   SpO2 96%   Physical Exam Vitals and nursing note reviewed.  Constitutional:      General: He is not in acute distress.    Appearance: He is not ill-appearing or toxic-appearing.  HENT:     Right Ear: Tympanic membrane, ear canal and external ear normal.     Left Ear: Tympanic membrane, ear canal and external ear normal.     Nose:     Comments: Bilateral erythema and edema to nasal turbinates with scant clear nasal discharge.    Mouth/Throat:     Mouth: Mucous membranes are moist.     Comments: Dentition unremarkable.  Moist mucous membranes.  Uvula does appear midline.  No sublingual elevation.  No trismus.  Controlling secretions.  Normal phonation.  Unable to fully view the posterior oropharynx due to difficulty with cooperation with patient due to his gag reflex. Eyes:     General: No scleral icterus. Pulmonary:     Effort: Pulmonary effort is normal. No respiratory distress.     Breath sounds: No stridor.  Skin:    General: Skin is  warm and dry.  Neurological:     Mental Status: He is alert.     (all labs ordered are listed, but only abnormal results are displayed) Labs Reviewed  RESP PANEL BY RT-PCR (RSV, FLU A&B, COVID)  [id number]  GROUP A STREP BY PCR    EKG: None  Radiology: No results found.  Procedures   Medications Ordered in the ED  acetaminophen  (TYLENOL ) tablet 1,000 mg (has no administration in time range)  ibuprofen  (ADVIL ) tablet 600 mg (has no administration in time range)  alum & mag hydroxide-simeth (MAALOX/MYLANTA) 200-200-20 MG/5ML suspension 30 mL (30 mLs Oral Given 04/30/24 0815)  lidocaine  (XYLOCAINE ) 2 % viscous mouth solution 15 mL (15 mLs Mouth/Throat Given 04/30/24 0815)    Clinical Course as of 04/30/24 0916  Sun Apr 30, 2024  0913 Personally reviewed patient, patient  with sore throat, odynophagia, however no trismus, dysphagia, dysphonia, fever.  Patient negative for strep and COVID/flu/RSV.  Unable to visualize posterior oropharynx on exam due to patient's prominent gag reflex despite treatment with viscous lidocaine  as well as use of popsicle stick, traction of tongue, etc.  Discussed that visualization of tonsils allow us  to evaluate pathologies such as peritonsillar abscess, however given patient does not have trismus, dysphagia, dysphonia, fever, overall low index of suspicion for this at this time, discussed risks and benefits of obtaining CT now for more definitive evaluation of tonsils given not visualized on exam, versus discharge with watchful waiting, patient prefers watchful waiting, I believe that this is reasonable.  Patient will follow-up closely with PCP, and return to the emergency department if he has any new or worsening symptoms. [LS]    Clinical Course User Index [LS] Rogelia Jerilynn RAMAN, MD   Medical Decision Making Risk OTC drugs. Prescription drug management.   73 y.o. male presents to the ER for evaluation of sore throat with rhinorrhea, nasal congestion,  and postnasal drip. Differential diagnosis includes but is not limited to Viral pharyngitis, strep pharyngitis, dental caries/abscess, esophagitis, sinusitis, post nasal drip, reflux, angioedema, RTA/PTA, Ludwig's angina. Vital signs mildly elevated blood pressure otherwise unremarkable. Physical exam as noted above.   I independently reviewed and interpreted the patient's labs.  Negative for COVID, flu, RSV, and strep.  Patient is not displaying any trismus.  He has normal phonation.  No difficulty with eating or drinking does have pain with doing so.  Patient has a very strong gag reflex.  Viscous lidocaine  with GI cocktail was trialed to see if this helps any however will still unable to visualize the tonsils.  Attending came to assess at bedside and with tongue depressor, traction of tongue, and elevation of the neck, still unable to visualize the majority of the posterior oropharynx and tonsils given his strong gag reflex.  I do have very low suspicion for any peritonsillar abscess as he does not have any trismus and is chewing secretions.  He has no reactive lymphadenopathy or facial swelling noted.  Shared decision-making was had between my attending and the patient with watchful waiting versus CT scan.  Patient would like to go with watchful waiting for now.  Strict return precautions were given to the patient.  Stable for discharge home.  We discussed the results of the labs/imaging. The plan is supportive care. We discussed strict return precautions and red flag symptoms. The patient verbalized their understanding and agrees to the plan. The patient is stable and being discharged home in good condition.  I discussed this case with my attending physician who cosigned this note including patient's presenting symptoms, physical exam, and planned diagnostics and interventions. Attending physician stated agreement with plan or made changes to plan which were implemented.   Attending physician assessed  patient at bedside.  Portions of this report may have been transcribed using voice recognition software. Every effort was made to ensure accuracy; however, inadvertent computerized transcription errors may be present.    Final diagnoses:  Sore throat    ED Discharge Orders     None          Bernis Ernst, [place] 04/30/24 9077    Rogelia Jerilynn RAMAN, MD 05/01/24 [phone]

## 2024-04-30 NOTE — ED Triage Notes (Signed)
 Pt having sore throat/hurts to swallow, nasal drainage, and mouth pain for the past 18 hours

## 2024-04-30 NOTE — Discharge Instructions (Addendum)
 Kevin Yang  Thank you for allowing us  to take care of you today.  You came to the Emergency Department today because you had sore throat.  Here in the emergency department you were negative for strep throat as well as COVID, flu, RSV.  We were unable to see your throat because of your strong gag reflex.  We would like to be able to look at your throat to look for things like a pocket of infection on your tonsil.  We are unable to rule this out since we cannot see your tonsil, however you do not have any symptoms of this such as difficulty swallowing, drooling, change in the sound of your voice, fever, inability to open your mouth/difficulty opening your mouth.  We discussed getting a CT to evaluate your tonsils more thoroughly, however given you are not having any symptoms of this, it is not reasonable to let you go home, however review develop any of these symptoms, please return to the emergency department for reevaluation.  Of your symptoms today do seem to improve you should follow-up closely with your primary care doctor within 1 week.  To-Do: 1. Please follow-up with your primary doctor within 7 days / as soon as possible.   Please return to the Emergency Department or call 911 if you experience have worsening of your symptoms, or do not get better, above symptoms, chest pain, shortness of breath, severe or significantly worsening pain, high fever, severe confusion, pass out or have any reason to think that you need emergency medical care.   We hope you feel better soon.   Mitzie Later, MD Department of Emergency Medicine [facility] [place]

## 2024-05-22 ENCOUNTER — Emergency Department (HOSPITAL_BASED_OUTPATIENT_CLINIC_OR_DEPARTMENT_OTHER): Admitting: Radiology

## 2024-05-22 ENCOUNTER — Encounter (HOSPITAL_BASED_OUTPATIENT_CLINIC_OR_DEPARTMENT_OTHER): Payer: Self-pay

## 2024-05-22 ENCOUNTER — Other Ambulatory Visit: Payer: Self-pay

## 2024-05-22 DIAGNOSIS — Z7951 Long term (current) use of inhaled steroids: Secondary | ICD-10-CM | POA: Insufficient documentation

## 2024-05-22 DIAGNOSIS — Z951 Presence of aortocoronary bypass graft: Secondary | ICD-10-CM | POA: Insufficient documentation

## 2024-05-22 DIAGNOSIS — R1013 Epigastric pain: Secondary | ICD-10-CM | POA: Diagnosis present

## 2024-05-22 DIAGNOSIS — J4489 Other specified chronic obstructive pulmonary disease: Secondary | ICD-10-CM | POA: Diagnosis not present

## 2024-05-22 DIAGNOSIS — I2510 Atherosclerotic heart disease of native coronary artery without angina pectoris: Secondary | ICD-10-CM | POA: Diagnosis not present

## 2024-05-22 DIAGNOSIS — R110 Nausea: Secondary | ICD-10-CM | POA: Diagnosis not present

## 2024-05-22 LAB — CBC
HCT: 38.4 % — ABNORMAL LOW (ref 39.0–52.0)
Hemoglobin: 12.2 g/dL — ABNORMAL LOW (ref 13.0–17.0)
MCH: 29.3 pg (ref 26.0–34.0)
MCHC: 31.8 g/dL (ref 30.0–36.0)
MCV: 92.1 fL (ref 80.0–100.0)
Platelets: 301 K/uL (ref 150–400)
RBC: 4.17 MIL/uL — ABNORMAL LOW (ref 4.22–5.81)
RDW: 13.1 % (ref 11.5–15.5)
WBC: 12.4 K/uL — ABNORMAL HIGH (ref 4.0–10.5)
nRBC: 0 % (ref 0.0–0.2)

## 2024-05-22 LAB — BASIC METABOLIC PANEL WITH GFR
Anion gap: 12 (ref 5–15)
BUN: 14 mg/dL (ref 8–23)
CO2: 25 mmol/L (ref 22–32)
Calcium: 9.5 mg/dL (ref 8.9–10.3)
Chloride: 104 mmol/L (ref 98–111)
Creatinine, Ser: 1.07 mg/dL (ref 0.61–1.24)
GFR, Estimated: >60 mL/min
Glucose, Bld: 137 mg/dL — ABNORMAL HIGH (ref 70–99)
Potassium: 3.8 mmol/L (ref 3.5–5.1)
Sodium: 141 mmol/L (ref 135–145)

## 2024-05-22 LAB — TROPONIN T, HIGH SENSITIVITY: Troponin T High Sensitivity: <15 ng/L (ref 0–19)

## 2024-05-22 MED ORDER — ONDANSETRON 4 MG PO TBDP
4.0000 mg | ORAL_TABLET | Freq: Once | ORAL | Status: AC
  Administered 2024-05-22: 4 mg via ORAL
  Filled 2024-05-22: qty 1

## 2024-05-22 MED ORDER — ACETAMINOPHEN 325 MG PO TABS
650.0000 mg | ORAL_TABLET | Freq: Four times a day (QID) | ORAL | Status: AC | PRN
  Administered 2024-05-22: 650 mg via ORAL
  Filled 2024-05-22: qty 2

## 2024-05-22 NOTE — ED Triage Notes (Signed)
 While doing the EKG scars noticed, he had a quadruple bypass. So his prior response to no cardiac history is incorrect.

## 2024-05-22 NOTE — ED Triage Notes (Signed)
 Patient reports nausea, and GERD symptoms  starting at 1600. He reports taking Gas-X and pantoprazole. He denies cardiac history but does say it has some chest pain in the center of the chest with no radiation.

## 2024-05-23 ENCOUNTER — Emergency Department (HOSPITAL_BASED_OUTPATIENT_CLINIC_OR_DEPARTMENT_OTHER)
Admission: EM | Admit: 2024-05-23 | Discharge: 2024-05-23 | Disposition: A | Discharge status: Home / Self Care | Attending: Emergency Medicine | Admitting: Emergency Medicine

## 2024-05-23 ENCOUNTER — Emergency Department (HOSPITAL_BASED_OUTPATIENT_CLINIC_OR_DEPARTMENT_OTHER)

## 2024-05-23 DIAGNOSIS — R1013 Epigastric pain: Secondary | ICD-10-CM

## 2024-05-23 LAB — HEPATIC FUNCTION PANEL
ALT: 18 U/L (ref 0–44)
AST: 35 U/L (ref 15–41)
Albumin: 4.1 g/dL (ref 3.5–5.0)
Alkaline Phosphatase: 82 U/L (ref 38–126)
Bilirubin, Direct: 0.2 mg/dL (ref 0.0–0.2)
Indirect Bilirubin: 0.4 mg/dL (ref 0.3–0.9)
Total Bilirubin: 0.6 mg/dL (ref 0.0–1.2)
Total Protein: 7.9 g/dL (ref 6.5–8.1)

## 2024-05-23 LAB — TROPONIN T, HIGH SENSITIVITY: Troponin T High Sensitivity: <15 ng/L (ref 0–19)

## 2024-05-23 MED ORDER — LIDOCAINE VISCOUS HCL 2 % MT SOLN
15.0000 mL | Freq: Once | OROMUCOSAL | Status: AC
  Administered 2024-05-23: 15 mL via ORAL
  Filled 2024-05-23: qty 15

## 2024-05-23 MED ORDER — SODIUM CHLORIDE 0.9 % IV BOLUS
1000.0000 mL | Freq: Once | INTRAVENOUS | Status: AC
  Administered 2024-05-23: 1000 mL via INTRAVENOUS

## 2024-05-23 MED ORDER — ALUM & MAG HYDROXIDE-SIMETH 200-200-20 MG/5ML PO SUSP
30.0000 mL | Freq: Once | ORAL | Status: AC
  Administered 2024-05-23: 30 mL via ORAL
  Filled 2024-05-23: qty 30

## 2024-05-23 MED ORDER — IOHEXOL 300 MG/ML  SOLN
100.0000 mL | Freq: Once | INTRAMUSCULAR | Status: AC | PRN
  Administered 2024-05-23: 100 mL via INTRAVENOUS

## 2024-05-23 NOTE — ED Notes (Signed)
 Patient transported to CT

## 2024-05-23 NOTE — ED Provider Notes (Signed)
 " [place] EMERGENCY DEPARTMENT AT [facility] Provider Note   CSN: [id number] Arrival date & time: 05/22/24  2054     Patient presents with: Gastroesophageal Reflux and Nausea   Kevin Yang is a 73 y.o. male.   Patient is a 73 year old male with history of coronary artery disease status post CABG in 2025.  He also has a history of COPD, obstructive sleep apnea, and asthma.  Patient presenting today with complaints of nausea, epigastric discomfort, and feeling bloated.  He believes he is having reflux symptoms.  He has been on reflux medications in the past, but has been off of these for the past 8 months.  He denies any chest pain or difficulty breathing.  No fevers or chills.  This is different than what he experienced with his coronary issues.       Prior to Admission medications  Medication Sig Start Date End Date Taking? Authorizing Provider  albuterol  (PROVENTIL  HFA;VENTOLIN  HFA) 108 (90 BASE) MCG/ACT inhaler Inhale 2 puffs into the lungs every 6 (six) hours as needed for wheezing or shortness of breath.     [provider]  atorvastatin (LIPITOR) 80 MG tablet TAKE ONE TABLET BY MOUTH AT BEDTIME FOR CHOLESTEROL 01/15/21   [provider]  Benzoyl Peroxide 10 % LIQD WASH AS DIRECTED AFFECTED AREA DAILY : APPLY TO THE SCALP, CHEST AND BACK DAILY IN THE SHOWER. RINSE OFF COMPLETELY. DRY OFF WITH WHITE TOWELS SINCE THIS WASH CAN BLEACH COLORS Patient not taking: Reported on 02/02/2024 07/09/20   [provider]  budesonide-formoterol (SYMBICORT) 160-4.5 MCG/ACT inhaler Inhale 2 puffs into the lungs 2 (two) times daily.    [facility]  Carboxymethylcellulose Sodium 1 % GEL APPLY 1 DROP TO EACH EYE FOUR TIMES A DAY Patient taking differently: Place 1 drop into both eyes 4 (four) times daily as needed (takes between 1-4 drops each day as needed). 06/19/20   [provider]  cetirizine (ZYRTEC) 10 MG tablet Take 1 tablet by mouth daily.  06/19/20   [provider]  diclofenac Sodium (VOLTAREN) 1 % GEL APPLY 4 GRAMS TO AFFECTED AREA FOUR TIMES A DAY AS NEEDED 06/19/20   [provider]  dicyclomine (BENTYL) 10 MG capsule TAKE ONE CAPSULE BY MOUTH TWICE A DAY BEFORE MEALS 06/19/20   [provider]  fluticasone (FLONASE) 50 MCG/ACT nasal spray Place 1 spray into both nostrils as needed. 2 sprays each nostril once a day 09/22/11   [provider]  formoterol (PERFOROMIST) 20 MCG/2ML nebulizer solution Take 20 mcg by nebulization 4 (four) times daily. Patient taking differently: Take 20 mcg by nebulization 4 (four) times daily as needed (taking PRN when COPD flares up).    [provider]  HYDROcodone-acetaminophen  (NORCO) 10-325 MG tablet TAKE ONE-HALF TO ONE TABLET BY MOUTH TWICE A DAY AS NEEDED (9/15M,10/29M) 03/11/21   [provider]  inFLIXimab-abda 100 MG SOLR Inject into the vein every 6 (six) weeks.    [facility]  ipratropium-albuterol  (DUONEB) 0.5-2.5 (3) MG/3ML SOLN Inhale 3 mLs into the lungs every 4 (four) hours as needed (for wheezing).    [provider]  lisinopril (ZESTRIL) 10 MG tablet Take 1 tablet by mouth daily. 11/09/20   [provider]  loperamide (IMODIUM) 2 MG capsule TAKE ONE CAPSULE BY MOUTH AS DIRECTED BY YOUR PROVIDER AS NEEDED    AFTER EACH LOOSE STOOL NO MORE THAN 4 TIMES PER DAY 02/17/21   [provider]  naloxone (NARCAN)  nasal spray 4 mg/0.1 mL SPRAY 1 SPRAY INTO ONE NOSTRIL AS DIRECTED FOR OPIOID OVERDOSE (TURN PERSON ON SIDE AFTER DOSE. IF NO RESPONSE IN 2-3 MINUTES OR PERSON RESPONDS BUT RELAPSES, REPEAT USING A NEW SPRAY DEVICE AND SPRAY INTO THE OTHER NOSTRIL. CALL 911 AFTER USE.) * EMERGENCY USE ONLY * 01/15/21   [provider]  omeprazole (PRILOSEC) 40 MG capsule TAKE ONE CAPSULE BY MOUTH DAILY (TAKE ON AN EMPTY STOMACH 30 MINUTES PRIOR TO A MEAL) Patient not taking: Reported on 02/02/2024 07/04/20   [provider]  predniSONE  (DELTASONE ) 10 MG tablet TAKE TWO TABLETS BY MOUTH IN THE MORNING -CONTINUE THE 20 MG DAILY UNTIL FURTHER INSTRUCTED (TAKE WITH FOOD) Patient not taking: Reported on 02/02/2024 02/27/21   [provider]  sildenafil  (VIAGRA ) 50 MG tablet Take 1 tablet (50 mg total) by mouth as needed for erectile dysfunction. 10/09/15   Jame Maude FALCON, MD  simvastatin (ZOCOR) 80 MG tablet Take 80 mg by mouth at bedtime.    [provider]  tamsulosin (FLOMAX) 0.4 MG CAPS Take 0.4 mg by mouth at bedtime.    [provider]    Allergies: Penicillins    Review of Systems  All other systems reviewed and are negative.   Updated Vital Signs BP (!) 158/97   Pulse 80   Temp (!) 97.5 F (36.4 C)   Resp 18   SpO2 96%   Physical Exam Vitals and nursing note reviewed.  Constitutional:      General: He is not in acute distress.    Appearance: He is well-developed. He is not diaphoretic.  HENT:     Head: Normocephalic and atraumatic.  Cardiovascular:     Rate and Rhythm: Normal rate and regular rhythm.     Heart sounds: No murmur heard.    No friction rub.  Pulmonary:     Effort: Pulmonary effort is normal. No respiratory distress.     Breath sounds: Normal breath sounds. No wheezing or rales.  Abdominal:     General: Bowel sounds are normal. There is no distension.     Palpations: Abdomen is soft.     Tenderness: There is abdominal tenderness. There is no right CVA tenderness, left CVA tenderness, guarding or rebound.     Comments: There is tenderness to palpation in the epigastric region.  Musculoskeletal:        General: Normal range of motion.     Cervical back: Normal range of motion and neck supple.  Skin:    General: Skin is warm and dry.  Neurological:     Mental Status: He is alert and oriented to person, place, and time.     Coordination: Coordination normal.     (all labs ordered are listed, but only abnormal results are  displayed) Labs Reviewed  BASIC METABOLIC PANEL WITH GFR - Abnormal; Notable for the following components:      Result Value   Glucose, Bld 137 (*)    All other components within normal limits  CBC - Abnormal; Notable for the following components:   WBC 12.4 (*)    RBC 4.17 (*)    Hemoglobin 12.2 (*)    HCT 38.4 (*)    All other components within normal limits  TROPONIN T, HIGH SENSITIVITY  TROPONIN T, HIGH SENSITIVITY    EKG: EKG Interpretation Date/Time:  Monday May 22 2024 21:10:26 EST Ventricular Rate:  76 PR Interval:  198 QRS Duration:  80 QT Interval:  364 QTC  Calculation: 409 R Axis:   74  Text Interpretation: Normal sinus rhythm Cannot rule out Inferior infarct , age undetermined Cannot rule out Anterior infarct , age undetermined Abnormal ECG When compared with ECG of 12-Jul-2019 02:12, no significant change is noted Confirmed by Geroldine Berg ([id number]) on 05/23/2024 1:20:42 AM  Radiology: DG Chest 2 View Result Date: 05/22/2024 CLINICAL DATA:  Chest pain and nausea. EXAM: CHEST - 2 VIEW COMPARISON:  07/11/2019 FINDINGS: Prior median sternotomy. Stable heart size and mediastinal contours. Aortic atherosclerosis. Patchy opacity in the lung bases. No pulmonary edema. No pleural effusion or pneumothorax. No acute osseous findings. IMPRESSION: Patchy opacity in the lung bases, atelectasis versus pneumonia. Electronically Signed   By: Andrea Gasman M.D.   On: 05/22/2024 21:42     Procedures   Medications Ordered in the ED  sodium chloride  0.9 % bolus 1,000 mL (has no administration in time range)  alum & mag hydroxide-simeth (MAALOX/MYLANTA) 200-200-20 MG/5ML suspension 30 mL (has no administration in time range)    And  lidocaine  (XYLOCAINE ) 2 % viscous mouth solution 15 mL (has no administration in time range)  acetaminophen  (TYLENOL ) tablet 650 mg (650 mg Oral Given 05/22/24 2353)  ondansetron  (ZOFRAN -ODT) disintegrating tablet 4 mg (4 mg Oral Given 05/22/24  2353)                                    Medical Decision Making Amount and/or Complexity of Data Reviewed Labs: ordered. Radiology: ordered.  Risk OTC drugs. Prescription drug management.   Patient presenting here with complaints of epigastric pain as described in the HPI.  He arrives here with stable vital signs and is afebrile.  Physical examination reveals epigastric tenderness, but is otherwise unremarkable.  Laboratory studies obtained including CBC, basic metabolic panel, hepatic function panel, and troponin x 2.  White count is 12.4, but studies otherwise unremarkable.  CT scan of the abdomen and pelvis obtained showing mild intrahepatic biliary dilatation and gallbladder distention without wall thickening or Perry cholecystic fluid.  Chest x-ray showing no acute process.  Patient presenting with epigastric pain that feels different than what he experienced with his cardiac issues.  His EKG is unchanged and troponin x 2 is negative.  Highly doubt a cardiac etiology.  Symptoms seem GI in nature as he got near complete resolution with a GI cocktail.  He will be discharged with continued use of his pantoprazole and return as needed.     Final diagnoses:  None    ED Discharge Orders     None          Geroldine Berg, MD 05/23/24 0404  "

## 2024-05-23 NOTE — Discharge Instructions (Signed)
 Continue use of your pantoprazole as previously prescribed.  Return to the emergency department if symptoms significantly worsen or change.
